# Patient Record
Sex: Female | Born: 1990 | Race: White | Hispanic: No | Marital: Married | Smoking: Never smoker
Health system: Southern US, Community
[De-identification: ages and names within clinical notes are randomized; demographics above are authoritative.]

## PROBLEM LIST (undated history)

## (undated) DIAGNOSIS — K5900 Constipation, unspecified: Secondary | ICD-10-CM

## (undated) DIAGNOSIS — F329 Major depressive disorder, single episode, unspecified: Secondary | ICD-10-CM

## (undated) DIAGNOSIS — R600 Localized edema: Secondary | ICD-10-CM

## (undated) DIAGNOSIS — F32A Depression, unspecified: Secondary | ICD-10-CM

## (undated) DIAGNOSIS — I10 Essential (primary) hypertension: Secondary | ICD-10-CM

## (undated) DIAGNOSIS — E785 Hyperlipidemia, unspecified: Secondary | ICD-10-CM

## (undated) DIAGNOSIS — R7303 Prediabetes: Secondary | ICD-10-CM

## (undated) DIAGNOSIS — F419 Anxiety disorder, unspecified: Secondary | ICD-10-CM

## (undated) DIAGNOSIS — K219 Gastro-esophageal reflux disease without esophagitis: Secondary | ICD-10-CM

## (undated) DIAGNOSIS — N946 Dysmenorrhea, unspecified: Secondary | ICD-10-CM

## (undated) DIAGNOSIS — O24419 Gestational diabetes mellitus in pregnancy, unspecified control: Secondary | ICD-10-CM

## (undated) HISTORY — DX: Gastro-esophageal reflux disease without esophagitis: K21.9

## (undated) HISTORY — DX: Essential (primary) hypertension: I10

## (undated) HISTORY — PX: NO PAST SURGERIES: SHX2092

## (undated) HISTORY — DX: Depression, unspecified: F32.A

## (undated) HISTORY — DX: Hyperlipidemia, unspecified: E78.5

## (undated) HISTORY — DX: Anxiety disorder, unspecified: F41.9

## (undated) HISTORY — DX: Constipation, unspecified: K59.00

## (undated) HISTORY — DX: Localized edema: R60.0

---
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
## 2017-03-31 DIAGNOSIS — N944 Primary dysmenorrhea: Secondary | ICD-10-CM

## 2017-03-31 HISTORY — DX: Primary dysmenorrhea: N94.4

## 2019-04-26 DIAGNOSIS — I10 Essential (primary) hypertension: Secondary | ICD-10-CM | POA: Insufficient documentation

## 2019-07-17 ENCOUNTER — Ambulatory Visit
Admission: EM | Admit: 2019-07-17 | Discharge: 2019-07-17 | Disposition: A | Discharge status: Home / Self Care | Payer: BC Managed Care – PPO | Attending: Emergency Medicine | Admitting: Emergency Medicine

## 2019-07-17 ENCOUNTER — Other Ambulatory Visit: Payer: Self-pay

## 2019-07-17 DIAGNOSIS — R51 Headache: Secondary | ICD-10-CM

## 2019-07-17 DIAGNOSIS — R112 Nausea with vomiting, unspecified: Secondary | ICD-10-CM | POA: Insufficient documentation

## 2019-07-17 DIAGNOSIS — Z20828 Contact with and (suspected) exposure to other viral communicable diseases: Secondary | ICD-10-CM

## 2019-07-17 DIAGNOSIS — R030 Elevated blood-pressure reading, without diagnosis of hypertension: Secondary | ICD-10-CM | POA: Diagnosis not present

## 2019-07-17 DIAGNOSIS — Z20822 Contact with and (suspected) exposure to covid-19: Secondary | ICD-10-CM

## 2019-07-17 DIAGNOSIS — Z3202 Encounter for pregnancy test, result negative: Secondary | ICD-10-CM | POA: Diagnosis not present

## 2019-07-17 DIAGNOSIS — R6889 Other general symptoms and signs: Secondary | ICD-10-CM | POA: Diagnosis not present

## 2019-07-17 LAB — POCT URINALYSIS DIP (MANUAL ENTRY)
Bilirubin, UA: NEGATIVE
Glucose, UA: NEGATIVE mg/dL
Ketones, POC UA: NEGATIVE mg/dL
Nitrite, UA: NEGATIVE
Protein Ur, POC: NEGATIVE mg/dL
Spec Grav, UA: 1.01 (ref 1.010–1.025)
Urobilinogen, UA: 0.2 E.U./dL
pH, UA: 6 (ref 5.0–8.0)

## 2019-07-17 LAB — POCT FASTING CBG KUC MANUAL ENTRY: POCT Glucose (KUC): 118 mg/dL — AB (ref 70–99)

## 2019-07-17 LAB — POCT URINE PREGNANCY: Preg Test, Ur: NEGATIVE

## 2019-07-17 NOTE — ED Provider Notes (Signed)
[facility]   [id number] 07/17/19 Arrival Time: 34  CC: ABDOMINAL DISCOMFORT  SUBJECTIVE:  BREEZY HERTENSTEIN is a 28 y.o. female who presents with headache, and vomiting with 4 episodes, that began 1 day ago.  Does admit to skipping a meal prior to symptoms.  Denies trauma, close contacts with similar symptoms, recent travel, antibiotic use, changes in diet or medications, or COVID/ flu/ strep exposure.  Denies abdominal pain.  Has not tried OTC medications.  Worse with eating, but tolerated tea and toast this morning.  Reports similar symptoms in the past when she has missed meals.   Denies fever, chills, rhinorrhea, congestion, cough, chest pain, SOB, diarrhea, constipation, hematochezia, melena, dysuria, difficulty urinating, increased frequency or urgency, flank pain, loss of bowel or bladder function, vaginal discharge, vaginal odor, vaginal bleeding, dyspareunia, pelvic pain.     No LMP recorded (lmp unknown). (Menstrual status: Irregular Periods).  ROS: As per HPI.  All other pertinent ROS negative.     History reviewed. No pertinent past medical history. History reviewed. No pertinent surgical history. No Known Allergies No current facility-administered medications on file prior to encounter.    Current Outpatient Medications on File Prior to Encounter  Medication Sig Dispense Refill  . escitalopram (LEXAPRO) 10 MG tablet Take 10 mg by mouth daily.     Social History   Socioeconomic History  . Marital status: Single    Spouse name: Not on file  . Number of children: Not on file  . Years of education: Not on file  . Highest education level: Not on file  Occupational History  . Not on file  Social Needs  . Financial resource strain: Not on file  . Food insecurity    Worry: Not on file    Inability: Not on file  . Transportation needs    Medical: Not on file    Non-medical: Not on file  Tobacco Use  . Smoking status: Not on file  Substance and Sexual  Activity  . Alcohol use: Not on file  . Drug use: Not on file  . Sexual activity: Not on file  Lifestyle  . Physical activity    Days per week: Not on file    Minutes per session: Not on file  . Stress: Not on file  Relationships  . Social Herbalist on phone: Not on file    Gets together: Not on file    Attends religious service: Not on file    Active member of club or organization: Not on file    Attends meetings of clubs or organizations: Not on file    Relationship status: Not on file  . Intimate partner violence    Fear of current or ex partner: Not on file    Emotionally abused: Not on file    Physically abused: Not on file    Forced sexual activity: Not on file  Other Topics Concern  . Not on file  Social History Narrative  . Not on file   History reviewed. No pertinent family history.   OBJECTIVE:  Vitals:   07/17/19 1036 07/17/19 1101  BP: (!) 164/124 (!) 154/99  Pulse: (!) 103   Resp: 18   Temp: 98.7 F (37.1 C)   SpO2: 96%     General appearance: Alert; appears mildly fatigued, but nontoxic HEENT: NCAT.  Oropharynx clear.  Lungs: clear to auscultation bilaterally without adventitious breath sounds Heart: regular rate and rhythm.  Radial pulses 2+ symmetrical bilaterally  Abdomen: soft, non-distended; normal active bowel sounds; non-tender to light and deep palpation; nontender at McBurney's point; no guarding Back: no CVA tenderness Extremities: no edema; symmetrical with no gross deformities Skin: warm and dry Neurologic: normal gait Psychological: alert and cooperative; normal mood and affect  LABS: Results for orders placed or performed during the hospital encounter of 07/17/19 (from the past 24 hour(s))  POCT urine pregnancy     Status: None   Collection Time: 07/17/19 10:50 AM  Result Value Ref Range   Preg Test, Ur Negative Negative  POCT urinalysis dipstick     Status: Abnormal   Collection Time: 07/17/19 10:50 AM  Result Value  Ref Range   Color, UA yellow yellow   Clarity, UA clear clear   Glucose, UA negative negative mg/dL   Bilirubin, UA negative negative   Ketones, POC UA negative negative mg/dL   Spec Grav, UA [phone] - 1.025   Blood, UA trace-intact (A) negative   pH, UA 6.0 5.0 - 8.0   Protein Ur, POC negative negative mg/dL   Urobilinogen, UA 0.2 0.2 or 1.0 E.U./dL   Nitrite, UA Negative Negative   Leukocytes, UA Small (1+) (A) Negative  POCT CBG (manual entry)     Status: Abnormal   Collection Time: 07/17/19 11:07 AM  Result Value Ref Range   POCT Glucose ([facility]) 118 (A) 70 - 99 mg/dL    ASSESSMENT & PLAN:  1. Suspected Covid-19 Virus Infection   2. Non-intractable vomiting with nausea, unspecified vomiting type   3. Elevated blood pressure reading    Declines zofran Blood sugar was 118. This is normal.   Urine pregnancy was negative Urine show trace blood and white blood cells.  We will send out to culture and notify you of abnormal results.    COVID testing ordered.  It will take 5-7 days for results.  Someone will contact you regarding abnormal results.   In the meantime: You should remain isolated in your home for 7 days from symptom onset AND greater than 72 hours after symptoms resolution (absence of fever without the use of fever-reducing medication and improvement in respiratory symptoms), whichever is longer Get plenty of rest and push fluids DIET Instructions:   Begin with sips of clear liquids. If able to hold down 2 - 4 ounces for 30 minutes, begin drinking more.  Increase your fluid intake to replace losses.  Clear liquids only for 24 hours (water, tea, sport drinks, clear flat ginger ale or cola and juices, broth, jello, popsicles, ect).  Advance to bland foods, applesauce, rice, baked or boiled chicken, ect.  Avoid milk, greasy foods and anything that doesn't agree with you. Use OTC medications like ibuprofen or tylenol as needed fever or pain Follow [facility] with [facility] week to establish care and have a physical Call or go to the ED if you have any new or worsening symptoms such as fever, worsening cough, shortness of breath, chest tightness, chest pain, turning blue, changes in mental status, nausea, vomiting, diarrhea, bloody or dark tarry stools, constipation, urinary symptoms, worsening abdominal discomfort, symptoms that do not improve with medications, inability to keep fluids down, etc...    Reviewed expectations re: course of current medical issues. Questions answered. Outlined signs and symptoms indicating need for more acute intervention. Patient verbalized understanding. After Visit Summary given.   Rennis HardingWurst, Kinsley Nicklaus, PA-C 07/17/19 1134

## 2019-07-17 NOTE — ED Triage Notes (Signed)
Pt states she has had vomiting since yesterday with headache, last vomited last night

## 2019-07-17 NOTE — Discharge Instructions (Signed)
Blood sugar was 118. This is normal.   Urine pregnancy was negative Urine show trace blood and white blood cells.  We will send out to culture and notify you of abnormal results.    COVID testing ordered.  It will take 5-7 days for results.  Someone will contact you regarding abnormal results.   In the meantime: You should remain isolated in your home for 7 days from symptom onset AND greater than 72 hours after symptoms resolution (absence of fever without the use of fever-reducing medication and improvement in respiratory symptoms), whichever is longer Get plenty of rest and push fluids DIET Instructions:   30 minutes after taking nausea medicine, begin with sips of clear liquids. If able to hold down 2 - 4 ounces for 30 minutes, begin drinking more.  Increase your fluid intake to replace losses.  Clear liquids only for 24 hours (water, tea, sport drinks, clear flat ginger ale or cola and juices, broth, jello, popsicles, ect).  Advance to bland foods, applesauce, rice, baked or boiled chicken, ect.  Avoid milk, greasy foods and anything that doesnt agree with you. Use OTC medications like ibuprofen or tylenol as needed fever or pain Follow up with Dr. Holly Bodily at [facility] next week to establish care and have a physical Call or go to the ED if you have any new or worsening symptoms such as fever, worsening cough, shortness of breath, chest tightness, chest pain, turning blue, changes in mental status, nausea, vomiting, diarrhea, bloody or dark tarry stools, constipation, urinary symptoms, worsening abdominal discomfort, symptoms that do not improve with medications, inability to keep fluids down, etc..Marland Kitchen

## 2019-07-18 LAB — URINE CULTURE

## 2019-07-19 LAB — NOVEL CORONAVIRUS, NAA: SARS-CoV-2, NAA: NOT DETECTED

## 2019-08-15 ENCOUNTER — Ambulatory Visit: Payer: BC Managed Care – PPO | Admitting: Family Medicine

## 2019-08-15 ENCOUNTER — Other Ambulatory Visit: Payer: Self-pay

## 2019-08-15 ENCOUNTER — Encounter: Payer: Self-pay | Admitting: Family Medicine

## 2019-08-15 VITALS — BP 122/84 | HR 63 | Temp 97.5°F | Resp 14 | Ht 59.0 in | Wt 187.1 lb

## 2019-08-15 DIAGNOSIS — R630 Anorexia: Secondary | ICD-10-CM

## 2019-08-15 DIAGNOSIS — I10 Essential (primary) hypertension: Secondary | ICD-10-CM | POA: Diagnosis not present

## 2019-08-15 DIAGNOSIS — F320 Major depressive disorder, single episode, mild: Secondary | ICD-10-CM

## 2019-08-15 DIAGNOSIS — Z6837 Body mass index (BMI) 37.0-37.9, adult: Secondary | ICD-10-CM

## 2019-08-15 DIAGNOSIS — N944 Primary dysmenorrhea: Secondary | ICD-10-CM

## 2019-08-15 DIAGNOSIS — R6889 Other general symptoms and signs: Secondary | ICD-10-CM | POA: Diagnosis not present

## 2019-08-15 DIAGNOSIS — E669 Obesity, unspecified: Secondary | ICD-10-CM

## 2019-08-15 DIAGNOSIS — R5383 Other fatigue: Secondary | ICD-10-CM | POA: Diagnosis not present

## 2019-08-15 DIAGNOSIS — Z1329 Encounter for screening for other suspected endocrine disorder: Secondary | ICD-10-CM

## 2019-08-15 DIAGNOSIS — Z130 Encounter for screening for diseases of the blood and blood-forming organs and certain disorders involving the immune mechanism: Secondary | ICD-10-CM

## 2019-08-15 DIAGNOSIS — Z13228 Encounter for screening for other metabolic disorders: Secondary | ICD-10-CM

## 2019-08-15 NOTE — Progress Notes (Signed)
Name: Kim Miles   MRN: [id number]    DOB: November 17, 1990   Date:08/21/2019       Progress Note  Chief Complaint  Patient presents with   Establish Care   Fatigue    loss of energy     Subjective:    Kim Miles is a 28 y.o. female,presents to clinic to establish care, today she complains of generalized fatigue and loss of appetite after GI illness about a month ago.    Had N/V about a month ago, went to [facility] in [place] to be seen, urine neg, blood sugar checked and wan normal, COVID test was normal, neg pregnancy test.  She states her symptoms were severe nausea and vomiting she can even keep water down.  She says that they "could not figure out what was wrong" and the told her how to gradually improve her diet she states that over the next week she was able to eat and drink without nausea vomiting.  At no time did she have any abdominal pain or diarrhea she did have some abdominal bloating.  The urgent care visit was on 07/17/2019 and for the past month since that visit she reports that her energy is not back to normal she has general lack of appetite and is feels extremely tired.  She is falling asleep at work at her desk, trouble staying away, if she sits down she can't stay away, so she tries to stay busy.  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  So far no near syncope, doesn't feel like she's going to pass out.  No CP, SOB, wheeze, palpitations, near syncope, numbness, tingling, headaches, visual disturbances, back pain, chest tightness.  Her strength and muscles don't feel like they are weak or gonna give out.   Just so tired.  If she sits down for more than 5 or 10 minutes she will fall asleep, but she has never had any like this before  She denies associated constipation, diarrhea, hair or skin changes, cold intolerance or heat intolerance, night sweats.   Denies any joint pain, swelling, fever, sweats, chills, lymphadenopathy, any rashes.  Patient is drinking and urinating like normal.  She does have a history of hot flashes to her face, with blushing, that randomly occur.  This is happened her whole life and there is no difference in this a do occur with some sweats.  Bedtime 11 pm and gets up at 6:30 am, almost falling asleep every day, she does try to get out of work for lunch.  She also notes elevated blood pressure during the urgent care visit and has been monitoring at home states that her blood pressure has been 140-150's/100-110, today is normal.  She does live alone, works alone  Family nearby in [place] -while ill she did go to stay with them she has a brother and a sister there, her mom and dad.  On Lexapro 10 mg for depression - mood and depression has gotten a lot better, PHQ was done today, mostly positive for fatigue and sleep symptoms and very mild symptoms for feeling bad, feeling down depressed hopeless, losing interest in things.  She is a little bit lonely and tired of Covid pandemic but she overall denies SI, HI, AVH and feels that her mood is much better on her medications.  She try to wean off  before but she became very depressed again and so she feels comfortable staying on Lexapro 10 mg dosing every day.   Office Visit from 08/15/2019 in [facility]  PHQ-9 Total Score  9     Appetite still down.  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 No pertinent  surgical history.  Family History  Problem Relation Age of Onset   Asthma Mother    Hypertension Father    Anemia Sister    Eating disorder Sister    Cancer Maternal Grandmother    Cancer Maternal Grandfather    Diabetes Maternal Grandfather    Alzheimer's disease Maternal Grandfather    Heart attack Paternal Grandmother    Arthritis Paternal Grandmother     Social History   Socioeconomic History   Marital status: Significant Other    Spouse name: Landi Biscardi   Number of children: 0   Years of education: 12   Highest education level: Master's degree (e.g., MA, MS, MEng, MEd, MSW, MBA)  Occupational History   Not on file  Social Needs   Financial resource strain: Not hard at all   Food insecurity    Worry: Never true    Inability: Never true   Transportation needs    Medical: No    Non-medical: No  Tobacco Use   Smoking status: Never Smoker   Smokeless tobacco: Never Used  Substance and Sexual Activity   Alcohol use: Never    Frequency: Never   Drug use: Never   Sexual activity: Never  Lifestyle   Physical activity    Days per week: 0 days    Minutes per session: 0 min   Stress: Only a little  Relationships   Social connections    Talks on phone: More than three times a week    Gets together: Once a week    Attends religious service: More than 4 times per year    Active member of club or organization: No    Attends meetings of clubs or organizations: Never    Relationship status: Never married   Intimate partner violence    Fear of current or ex partner: No    Emotionally abused: No    Physically abused: No    Forced sexual activity: No  Other Topics Concern   Not on file  Social History Narrative   Not on file     Current Outpatient Medications:    escitalopram (LEXAPRO) 10 MG tablet, Take 10 mg by mouth daily., Disp: , Rfl:    Norethin Ace-Eth Estrad-FE (BLISOVI FE 1/20 PO), Take by mouth., Disp: , Rfl:     lisinopril (ZESTRIL) 10 MG tablet, Take 1 tablet (10 mg total) by mouth daily., Disp: 90 tablet, Rfl: 3  No Known Allergies  I personally reviewed active problem list, medication list, allergies, family history, social history, health maintenance, notes from last encounter, lab results, imaging with the patient/caregiver today.  Review of Systems  Constitutional: Negative.   HENT: Negative.  Eyes: Negative.   Respiratory: Negative.   Cardiovascular: Negative.   Gastrointestinal: Negative.   Endocrine: Negative.   Genitourinary: Negative.   Musculoskeletal: Negative.   Skin: Negative.   Allergic/Immunologic: Negative.   Neurological: Negative.   Hematological: Negative.   Psychiatric/Behavioral: Negative.   All other systems reviewed and are negative.    Objective:   84 HR at time of exam Vitals:   08/15/19 1402  BP: 122/84  Pulse: 63  Resp: 14  Temp: (!) 97.5 F (36.4 C)  SpO2: 99%  Weight: 187 lb 1.6 oz (84.9 kg)  Height:  (1.499 m)    Body mass index is 37.79 kg/m.  Physical Exam Vitals signs and nursing note reviewed.  Constitutional:      General: She is not in acute distress.    Appearance: Normal appearance. She is well-developed. She is obese. She is not ill-appearing, toxic-appearing or diaphoretic.  HENT:     Head: Normocephalic and atraumatic.     Right Ear: External ear normal.     Left Ear: External ear normal.     Nose: Nose normal.     Mouth/Throat:     Mouth: Mucous membranes are moist.     Pharynx: Oropharynx is clear. Uvula midline.  Eyes:     General: Lids are normal. No scleral icterus.       Right eye: No discharge.        Left eye: No discharge.     Conjunctiva/sclera: Conjunctivae normal.     Pupils: Pupils are equal, round, and reactive to light.  Neck:     Musculoskeletal: Normal range of motion and neck supple.     Trachea: Phonation normal. No tracheal deviation.  Cardiovascular:     Rate and Rhythm: Normal rate and  regular rhythm.     Pulses: Normal pulses.          Radial pulses are 2+ on the right side and 2+ on the left side.       Posterior tibial pulses are 2+ on the right side and 2+ on the left side.     Heart sounds: Normal heart sounds. No murmur. No friction rub. No gallop.   Pulmonary:     Effort: Pulmonary effort is normal. No respiratory distress.     Breath sounds: Normal breath sounds. No stridor. No wheezing, rhonchi or rales.  Chest:     Chest wall: No tenderness.  Abdominal:     General: Bowel sounds are normal. There is no distension.     Palpations: Abdomen is soft.     Tenderness: There is no abdominal tenderness. There is no guarding or rebound.  Musculoskeletal: Normal range of motion.        General: No deformity.     Right lower leg: No edema.     Left lower leg: No edema.  Lymphadenopathy:     Cervical: No cervical adenopathy.  Skin:    General: Skin is warm and dry.     Capillary Refill: Capillary refill takes less than 2 seconds.     Coloration: Skin is not jaundiced or pale.     Findings: No rash.  Neurological:     Mental Status: She is alert.     Sensory: No sensory deficit.     Motor: No weakness or abnormal muscle tone.     Coordination: Coordination normal.     Gait: Gait normal.  Psychiatric:        Mood and Affect: Mood normal.  Speech: Speech normal.        Behavior: Behavior normal.        Thought Content: Thought content normal.        Judgment: Judgment normal.     Orthostatics done and negative  ECG interpretation   Date: 08/21/19  Rate: 86  Rhythm: normal sinus rhythm  QRS Axis: normal  Intervals: normal  ST/T Wave abnormalities: non-specific t-wave abnormalities in lead III, no ST elevation or depression  Conduction Disutrbances: none  Narrative Interpretation: "Sinus rhythm, Septal T wave changes borderlin abnormal - Borderline ECG"  Old EKG Reviewed: No past ECG to compare to    Recent Results (from the past 2160 hour(s))    Novel Coronavirus, NAA ([facility])     Status: None   Collection Time: 07/17/19 10:45 AM   Specimen: Nasopharyngeal Swab  Result Value Ref Range   SARS-CoV-2, NAA Not Detected Not Detected    Comment: This nucleic acid amplification test was developed and its performance characteristics determined by [facility]. Nucleic acid amplification tests include PCR and TMA. This test has not been FDA cleared or approved. This test has been authorized by FDA under an Emergency Use Authorization (EUA). This test is only authorized for the duration of time the declaration that circumstances exist justifying the authorization of the emergency use of in vitro diagnostic tests for detection of SARS-CoV-2 virus and/or diagnosis of COVID-19 infection under section 564(b)(1) of the Act, 21 U.S.C. 956OZH-0(Q360bbb-3(b) (1), unless the authorization is terminated or revoked sooner. When diagnostic testing is negative, the possibility of a false negative result should be considered in the context of a patient's recent exposures and the presence of clinical signs and symptoms consistent with COVID-19. An individual without symptoms of COVID-19 and who is not shedding SARS-CoV-2 virus would  expect to have a negative (not detected) result in this assay.   POCT urine pregnancy     Status: None   Collection Time: 07/17/19 10:50 AM  Result Value Ref Range   Preg Test, Ur Negative Negative  POCT urinalysis dipstick     Status: Abnormal   Collection Time: 07/17/19 10:50 AM  Result Value Ref Range   Color, UA yellow yellow   Clarity, UA clear clear   Glucose, UA negative negative mg/dL   Bilirubin, UA negative negative   Ketones, POC UA negative negative mg/dL   Spec Grav, UA [phone] - 1.025   Blood, UA trace-intact (A) negative   pH, UA 6.0 5.0 - 8.0   Protein Ur, POC negative negative mg/dL   Urobilinogen, UA 0.2 0.2 or 1.0 E.U./dL   Nitrite, UA Negative Negative   Leukocytes, UA Small (1+) (A)  Negative  POCT CBG (manual entry)     Status: Abnormal   Collection Time: 07/17/19 11:07 AM  Result Value Ref Range   POCT Glucose ([facility]) 118 (A) 70 - 99 mg/dL  Urine Culture     Status: Abnormal   Collection Time: 07/17/19 11:08 AM   Specimen: Urine, Clean Catch  Result Value Ref Range   Specimen Description      URINE, CLEAN CATCH Performed at [facility], [address] [place], [place] [address]    Special Requests      NONE Performed at [facility], [address] [place], [place] [address]    Culture MULTIPLE SPECIES PRESENT, SUGGEST RECOLLECTION (A)    Report Status 07/18/2019 FINAL   COMPLETE METABOLIC PANEL WITH GFR     Status: None   Collection  Time: 08/15/19 12:00 AM  Result Value Ref Range   Glucose, Bld 91 65 - 99 mg/dL    Comment: .            Fasting reference interval .    BUN 10 7 - 25 mg/dL   Creat 5.40 9.81 - 1.91 mg/dL   GFR, Est Non African American 120 > OR = 60 mL/min/1.60m2   GFR, Est African American 140 > OR = 60 mL/min/1.64m2   BUN/Creatinine Ratio NOT APPLICABLE 6 - 22 (calc)   Sodium 138 135 - 146 mmol/L   Potassium 4.1 3.5 - 5.3 mmol/L   Chloride 102 98 - 110 mmol/L   CO2 28 20 - 32 mmol/L   Calcium 8.9 8.6 - 10.2 mg/dL   Total Protein 6.8 6.1 - 8.1 g/dL   Albumin 3.7 3.6 - 5.1 g/dL   Globulin 3.1 1.9 - 3.7 g/dL (calc)   AG Ratio 1.2 1.0 - 2.5 (calc)   Total Bilirubin 0.3 0.2 - 1.2 mg/dL   Alkaline phosphatase (APISO) 78 31 - 125 U/L   AST 18 10 - 30 U/L   ALT 7 6 - 29 U/L  CBC with Differential/Platelet     Status: None   Collection Time: 08/15/19 12:00 AM  Result Value Ref Range   WBC 10.3 3.8 - 10.8 Thousand/uL   RBC 4.57 3.80 - 5.10 Million/uL   Hemoglobin 12.7 11.7 - 15.5 g/dL   HCT 47.8 29.5 - 62.1 %   MCV 86.4 80.0 - 100.0 fL   MCH 27.8 27.0 - 33.0 pg   MCHC 32.2 32.0 - 36.0 g/dL   RDW 30.8 65.7 - 84.6 %   Platelets 355 140 - 400 Thousand/uL   MPV 10.2 7.5 - 12.5 fL   Neutro Abs 6,561 1,500 - 7,800 cells/uL   Lymphs Abs  2,616 850 - 3,900 cells/uL   Absolute Monocytes 834 200 - 950 cells/uL   Eosinophils Absolute 196 15 - 500 cells/uL   Basophils Absolute 93 0 - 200 cells/uL   Neutrophils Relative % 63.7 %   Total Lymphocyte 25.4 %   Monocytes Relative 8.1 %   Eosinophils Relative 1.9 %   Basophils Relative 0.9 %  Hemoglobin A1c     Status: Abnormal   Collection Time: 08/15/19 12:00 AM  Result Value Ref Range   Hgb A1c MFr Bld 5.7 (H) <5.7 % of total Hgb    Comment: For someone without known diabetes, a hemoglobin  A1c value between 5.7% and 6.4% is consistent with prediabetes and should be confirmed with a  follow-up test. . For someone with known diabetes, a value <7% indicates that their diabetes is well controlled. A1c targets should be individualized based on duration of diabetes, age, comorbid conditions, and other considerations. . This assay result is consistent with an increased risk of diabetes. . Currently, no consensus exists regarding use of hemoglobin A1c for diagnosis of diabetes for children. .    Mean Plasma Glucose 117 (calc)   eAG (mmol/L) 6.5 (calc)  TSH     Status: None   Collection Time: 08/15/19 12:00 AM  Result Value Ref Range   TSH 0.70 mIU/L    Comment:           Reference Range .           > or = 20 Years  0.40-4.50 .                Pregnancy Ranges  First trimester    0.26-2.66           Second trimester   0.55-2.73           Third trimester    0.43-2.91   T4, free     Status: None   Collection Time: 08/15/19 12:00 AM  Result Value Ref Range   Free T4 0.9 0.8 - 1.8 ng/dL    ZOX0/9: Depression screen PHQ 2/9 08/21/2019  Decreased Interest 1  Down, Depressed, Hopeless 1  PHQ - 2 Score 2  Altered sleeping 2  Tired, decreased energy 3  Change in appetite 0  Feeling bad or failure about yourself  1  Trouble concentrating 0  Moving slowly or fidgety/restless 1  Suicidal thoughts 0  PHQ-9 Score 9  Difficult doing work/chores Very difficult     phq 9 is positive Addressed today, see HPI and A&P  Fall Risk: Fall Risk  08/15/2019  Falls in the past year? 0  Number falls in past yr: 0  Injury with Fall? 0    Functional Status Survey: Is the patient deaf or have difficulty hearing?: No Does the patient have difficulty seeing, even when wearing glasses/contacts?: No Does the patient have difficulty concentrating, remembering, or making decisions?: No Does the patient have difficulty walking or climbing stairs?: No Does the patient have difficulty dressing or bathing?: No Does the patient have difficulty doing errands alone such as visiting a doctor's office or shopping?: No    Assessment & Plan:    Pt here to establish care, has several recent acute complaints:    ICD-10-CM   1. Fatigue, unspecified type  R53.83 COMPLETE METABOLIC PANEL WITH GFR    CBC with Differential/Platelet    Hemoglobin A1c    TSH    T4, free    VITAMIN D 25 Hydroxy (Vit-D Deficiency, Fractures)    EKG 12-Lead   r/o causes - anemia, hypothyroid, screen for deficiencies ie vit D, B12, possibly COVID?, med SE?  2. Hypertension, unspecified type  I10 COMPLETE METABOLIC PANEL WITH GFR    EKG 12-Lead    lisinopril (ZESTRIL) 10 MG tablet   BP elevated today, similar to home readings, check labs - will start meds after reviewing labs, need to stop OCP's?, no end organ damage suspected EKG good  3. Decreased appetite  R63.0 COMPLETE METABOLIC PANEL WITH GFR    CBC with Differential/Platelet    Hemoglobin A1c    TSH    T4, free   following GI illness one month ago, no other associated GI sx, check labs, currently no indication for imaging, no abd ttp, check labs  4. Unintentional weight change  R68.89 COMPLETE METABOLIC PANEL WITH GFR    Hemoglobin A1c    TSH    T4, free   weight gain, r/o hypothyroid, may be secondary to depression?  med SE?  5. Other general symptoms and signs  R68.89 Hemoglobin A1c    TSH    T4, free    VITAMIN D 25  Hydroxy (Vit-D Deficiency, Fractures)    EKG 12-Lead   hot flashes, sweats, fatigue.  No recent tick bite, no rash, no arthralgias, fever, URI/ST sx, covid neg  6. Screening for endocrine, metabolic and immunity disorder  Z13.29 COMPLETE METABOLIC PANEL WITH GFR   Z13.228 CBC with Differential/Platelet   Z13.0 Hemoglobin A1c    TSH    T4, free    VITAMIN D 25 Hydroxy (Vit-D Deficiency, Fractures)  7. Screening for deficiency anemia  Z13.0 CBC with Differential/Platelet  8. Current mild episode of major depressive disorder, unspecified whether recurrent (HCC)  F32.0    mood good with lexapro, doesn't feel sad/depressed, well controlled  9. Class 2 obesity with body mass index (BMI) of 37.0 to 37.9 in adult, unspecified obesity type, unspecified whether serious comorbidity present  E66.9 COMPLETE METABOLIC PANEL WITH GFR   Z68.37 CBC with Differential/Platelet    Hemoglobin A1c    TSH    T4, free   Deferrred any education regarding BMI until later appt  10. Primary dysmenorrhea  N94.4 Norethin Ace-Eth Estrad-FE (BLISOVI FE 1/20 PO)   well controlled on OCP for the past year   Unclear etiology of all pt's sx, GI illness about a month ago that lasted 1 week with all sx resolving except decreased appetite - pt very fatigued since.  COVID was negative - no other focal or associated sx to suggest etiology - most suspicious would be thyroid dysfunction with fatigue and weight change.  Will check basic labs and thyroid.  F/up in 2 weeks to recheck BP.  Considered autoimmune, tickborn illness, and other causes of fatigue and her other sx, like palpitations, sweats, weight changes, following N/V, but lack of other sx made me feel that additional testing was unnecessary at this time.  Danelle Berry, PA-C 08/21/19 5:10 PM

## 2019-08-16 LAB — COMPLETE METABOLIC PANEL WITH GFR
AG Ratio: 1.2 (calc) (ref 1.0–2.5)
ALT: 7 U/L (ref 6–29)
AST: 18 U/L (ref 10–30)
Albumin: 3.7 g/dL (ref 3.6–5.1)
Alkaline phosphatase (APISO): 78 U/L (ref 31–125)
BUN: 10 mg/dL (ref 7–25)
CO2: 28 mmol/L (ref 20–32)
Calcium: 8.9 mg/dL (ref 8.6–10.2)
Chloride: 102 mmol/L (ref 98–110)
Creat: 0.63 mg/dL (ref 0.50–1.10)
GFR, Est African American: 140 mL/min/{1.73_m2} (ref >=60)
GFR, Est Non African American: 120 mL/min/{1.73_m2} (ref >=60)
Globulin: 3.1 g/dL (calc) (ref 1.9–3.7)
Glucose, Bld: 91 mg/dL (ref 65–99)
Potassium: 4.1 mmol/L (ref 3.5–5.3)
Sodium: 138 mmol/L (ref 135–146)
Total Bilirubin: 0.3 mg/dL (ref 0.2–1.2)
Total Protein: 6.8 g/dL (ref 6.1–8.1)

## 2019-08-16 LAB — CBC WITH DIFFERENTIAL/PLATELET
Absolute Monocytes: 834 cells/uL (ref 200–950)
Basophils Absolute: 93 cells/uL (ref 0–200)
Basophils Relative: 0.9 %
Eosinophils Absolute: 196 cells/uL (ref 15–500)
Eosinophils Relative: 1.9 %
HCT: 39.5 % (ref 35.0–45.0)
Hemoglobin: 12.7 g/dL (ref 11.7–15.5)
Lymphs Abs: 2616 cells/uL (ref 850–3900)
MCH: 27.8 pg (ref 27.0–33.0)
MCHC: 32.2 g/dL (ref 32.0–36.0)
MCV: 86.4 fL (ref 80.0–100.0)
MPV: 10.2 fL (ref 7.5–12.5)
Monocytes Relative: 8.1 %
Neutro Abs: 6561 cells/uL (ref 1500–7800)
Neutrophils Relative %: 63.7 %
Platelets: 355 10*3/uL (ref 140–400)
RBC: 4.57 10*6/uL (ref 3.80–5.10)
RDW: 13.9 % (ref 11.0–15.0)
Total Lymphocyte: 25.4 %
WBC: 10.3 10*3/uL (ref 3.8–10.8)

## 2019-08-16 LAB — TSH: TSH: 0.7 mIU/L

## 2019-08-16 LAB — HEMOGLOBIN A1C
Hgb A1c MFr Bld: 5.7 % of total Hgb — ABNORMAL HIGH (ref <5.7)
Mean Plasma Glucose: 117 (calc)
eAG (mmol/L): 6.5 (calc)

## 2019-08-16 LAB — T4, FREE: Free T4: 0.9 ng/dL (ref 0.8–1.8)

## 2019-08-16 MED ORDER — LISINOPRIL 10 MG PO TABS: 10.0000 mg | ORAL_TABLET | Freq: Every day | ORAL | 3 refills | Status: DC

## 2019-08-21 ENCOUNTER — Encounter: Payer: Self-pay | Admitting: Family Medicine

## 2019-08-29 ENCOUNTER — Encounter: Payer: Self-pay | Admitting: Family Medicine

## 2019-08-29 ENCOUNTER — Ambulatory Visit: Payer: BC Managed Care – PPO | Admitting: Family Medicine

## 2019-08-29 ENCOUNTER — Other Ambulatory Visit: Payer: Self-pay

## 2019-08-29 VITALS — BP 120/82 | HR 89 | Temp 97.5°F | Resp 14 | Ht 59.0 in | Wt 185.9 lb

## 2019-08-29 DIAGNOSIS — N944 Primary dysmenorrhea: Secondary | ICD-10-CM

## 2019-08-29 DIAGNOSIS — I10 Essential (primary) hypertension: Secondary | ICD-10-CM | POA: Diagnosis not present

## 2019-08-29 DIAGNOSIS — F320 Major depressive disorder, single episode, mild: Secondary | ICD-10-CM

## 2019-08-29 DIAGNOSIS — R5383 Other fatigue: Secondary | ICD-10-CM | POA: Diagnosis not present

## 2019-08-29 DIAGNOSIS — R6889 Other general symptoms and signs: Secondary | ICD-10-CM | POA: Diagnosis not present

## 2019-08-29 DIAGNOSIS — E6609 Other obesity due to excess calories: Secondary | ICD-10-CM

## 2019-08-29 DIAGNOSIS — R7303 Prediabetes: Secondary | ICD-10-CM

## 2019-08-29 DIAGNOSIS — Z6837 Body mass index (BMI) 37.0-37.9, adult: Secondary | ICD-10-CM

## 2019-08-29 MED ORDER — ESCITALOPRAM OXALATE 10 MG PO TABS: 10.0000 mg | ORAL_TABLET | Freq: Every day | ORAL | 3 refills | Status: DC

## 2019-08-29 NOTE — Progress Notes (Signed)
Patient ID: Kim Miles, female    DOB: August 25, 1991, 28 y.o.   MRN: [id number]  PCP: Danelle Berry, PA-C  Chief Complaint  Patient presents with  . Follow-up    Subjective:   Kim Miles is a 28 y.o. female, presents to clinic with CC of the following:   Pt here for blood pressure recheck. Lab OV BP was elevated 140-150's with diastolic 90-100  BP Readings from Last 5 Encounters:  08/29/19 120/82  08/15/19 122/84  07/17/19 (!) 154/99    Pt agreed to start lisinopril Pt reports home BP readings have been in the 120-130's / 80's range Taking meds as directed w/o problems, reports he is compliant with med changes. Pt denies chest pain, SOB, dizziness, or heart palpitations.  Denies medication side effects.   Pt overall feels much better, last visit she was dealing with weight gain, severe fatigue, vague abd discomfort and decreased appetite.  She reports her eating/nutrition is better, energy better, she is having less headaches No longer falling asleep at work  ESS score 11 She is falling asleep less, feels much better with changing her foods, eliminating fast food She does not want a sleep study right now, she feels like after being ill, that she felt so bad because of her current lifestyle.     Patient Active Problem List   Diagnosis Date Noted  . High blood pressure 04/26/2019  . Primary dysmenorrhea 03/31/2017      Current Outpatient Medications:  .  escitalopram (LEXAPRO) 10 MG tablet, Take 10 mg by mouth daily., Disp: , Rfl:  .  lisinopril (ZESTRIL) 10 MG tablet, Take 1 tablet (10 mg total) by mouth daily., Disp: 90 tablet, Rfl: 3 .  Norethin Ace-Eth Estrad-FE (BLISOVI FE 1/20 PO), Take by mouth., Disp: , Rfl:    No Known Allergies   Family History  Problem Relation Age of Onset  . Asthma Mother   . Hypertension Father   . Anemia Sister   . Eating disorder Sister   . Cancer Maternal Grandmother   . Cancer Maternal Grandfather   . Diabetes  Maternal Grandfather   . Alzheimer's disease Maternal Grandfather   . Heart attack Paternal Grandmother   . Arthritis Paternal Grandmother      Social History   Socioeconomic History  . Marital status: Significant Other    Spouse name: Ninel Abdella  . Number of children: 0  . Years of education: 37  . Highest education level: Master's degree (e.g., MA, MS, MEng, MEd, MSW, MBA)  Occupational History  . Not on file  Social Needs  . Financial resource strain: Not hard at all  . Food insecurity    Worry: Never true    Inability: Never true  . Transportation needs    Medical: No    Non-medical: No  Tobacco Use  . Smoking status: Never Smoker  . Smokeless tobacco: Never Used  Substance and Sexual Activity  . Alcohol use: Never    Frequency: Never  . Drug use: Never  . Sexual activity: Never  Lifestyle  . Physical activity    Days per week: 0 days    Minutes per session: 0 min  . Stress: Only a little  Relationships  . Social connections    Talks on phone: More than three times a week    Gets together: Once a week    Attends religious service: More than 4 times per year    Active member of club or  organization: No    Attends meetings of clubs or organizations: Never    Relationship status: Never married  . Intimate partner violence    Fear of current or ex partner: No    Emotionally abused: No    Physically abused: No    Forced sexual activity: No  Other Topics Concern  . Not on file  Social History Narrative  . Not on file    I personally reviewed active problem list, medication list, allergies, notes from last encounter, lab results with the patient/caregiver today.  Review of Systems  Constitutional: Negative.   HENT: Negative.   Eyes: Negative.   Respiratory: Negative.   Cardiovascular: Negative.   Gastrointestinal: Negative.   Endocrine: Negative.   Genitourinary: Negative.   Musculoskeletal: Negative.   Skin: Negative.   Allergic/Immunologic:  Negative.   Neurological: Negative.   Hematological: Negative.   Psychiatric/Behavioral: Negative.   All other systems reviewed and are negative.      Objective:   Vitals:   08/29/19 1404  BP: 120/82  Pulse: 89  Resp: 14  Temp: (!) 97.5 F (36.4 C)  SpO2: 99%  Weight: 185 lb 14.4 oz (84.3 kg)  Height: 4\' 11"  (1.499 m)    Body mass index is 37.55 kg/m.  Physical Exam Vitals signs and nursing note reviewed.  Constitutional:      General: She is not in acute distress.    Appearance: Normal appearance. She is well-developed. She is not ill-appearing, toxic-appearing or diaphoretic.     Interventions: Face mask in place.  HENT:     Head: Normocephalic and atraumatic.     Right Ear: External ear normal.     Left Ear: External ear normal.  Eyes:     General: Lids are normal. No scleral icterus.       Right eye: No discharge.        Left eye: No discharge.     Conjunctiva/sclera: Conjunctivae normal.  Neck:     Musculoskeletal: Normal range of motion and neck supple.     Trachea: Phonation normal. No tracheal deviation.  Cardiovascular:     Rate and Rhythm: Normal rate and regular rhythm.     Pulses: Normal pulses.          Radial pulses are 2+ on the right side and 2+ on the left side.       Posterior tibial pulses are 2+ on the right side and 2+ on the left side.     Heart sounds: Normal heart sounds. No murmur. No friction rub. No gallop.   Pulmonary:     Effort: Pulmonary effort is normal. No respiratory distress.     Breath sounds: Normal breath sounds. No stridor. No wheezing, rhonchi or rales.  Chest:     Chest wall: No tenderness.  Abdominal:     General: Bowel sounds are normal. There is no distension.     Palpations: Abdomen is soft.     Tenderness: There is no abdominal tenderness. There is no guarding or rebound.  Musculoskeletal: Normal range of motion.        General: No deformity.     Right lower leg: No edema.     Left lower leg: No edema.   Lymphadenopathy:     Cervical: No cervical adenopathy.  Skin:    General: Skin is warm and dry.     Capillary Refill: Capillary refill takes less than 2 seconds.     Coloration: Skin is not jaundiced or pale.  Findings: No rash.  Neurological:     Mental Status: She is alert and oriented to person, place, and time.     Motor: No abnormal muscle tone.     Gait: Gait normal.  Psychiatric:        Mood and Affect: Mood normal.        Speech: Speech normal.        Behavior: Behavior normal.      Results for orders placed or performed in visit on 08/15/19  COMPLETE METABOLIC PANEL WITH GFR  Result Value Ref Range   Glucose, Bld 91 65 - 99 mg/dL   BUN 10 7 - 25 mg/dL   Creat 7.820.63 9.560.50 - 2.131.10 mg/dL   GFR, Est Non African American 120 > OR = 60 mL/min/1.4673m2   GFR, Est African American 140 > OR = 60 mL/min/1.4473m2   BUN/Creatinine Ratio NOT APPLICABLE 6 - 22 (calc)   Sodium 138 135 - 146 mmol/L   Potassium 4.1 3.5 - 5.3 mmol/L   Chloride 102 98 - 110 mmol/L   CO2 28 20 - 32 mmol/L   Calcium 8.9 8.6 - 10.2 mg/dL   Total Protein 6.8 6.1 - 8.1 g/dL   Albumin 3.7 3.6 - 5.1 g/dL   Globulin 3.1 1.9 - 3.7 g/dL (calc)   AG Ratio 1.2 1.0 - 2.5 (calc)   Total Bilirubin 0.3 0.2 - 1.2 mg/dL   Alkaline phosphatase (APISO) 78 31 - 125 U/L   AST 18 10 - 30 U/L   ALT 7 6 - 29 U/L  CBC with Differential/Platelet  Result Value Ref Range   WBC 10.3 3.8 - 10.8 Thousand/uL   RBC 4.57 3.80 - 5.10 Million/uL   Hemoglobin 12.7 11.7 - 15.5 g/dL   HCT 08.639.5 57.835.0 - 46.945.0 %   MCV 86.4 80.0 - 100.0 fL   MCH 27.8 27.0 - 33.0 pg   MCHC 32.2 32.0 - 36.0 g/dL   RDW 62.913.9 52.811.0 - 41.315.0 %   Platelets 355 140 - 400 Thousand/uL   MPV 10.2 7.5 - 12.5 fL   Neutro Abs 6,561 1,500 - 7,800 cells/uL   Lymphs Abs 2,616 850 - 3,900 cells/uL   Absolute Monocytes 834 200 - 950 cells/uL   Eosinophils Absolute 196 15 - 500 cells/uL   Basophils Absolute 93 0 - 200 cells/uL   Neutrophils Relative % 63.7 %   Total  Lymphocyte 25.4 %   Monocytes Relative 8.1 %   Eosinophils Relative 1.9 %   Basophils Relative 0.9 %  Hemoglobin A1c  Result Value Ref Range   Hgb A1c MFr Bld 5.7 (H) <5.7 % of total Hgb   Mean Plasma Glucose 117 (calc)   eAG (mmol/L) 6.5 (calc)  TSH  Result Value Ref Range   TSH 0.70 mIU/L  T4, free  Result Value Ref Range   Free T4 0.9 0.8 - 1.8 ng/dL        Assessment & Plan:      ICD-10-CM   1. Hypertension, unspecified type  I10    well controlled  2. Fatigue, unspecified type  R53.83    improving with lifestyle changes  3. Unintentional weight change  R68.89    working on lifestyle changes, no hypothyroid  4. Primary dysmenorrhea  N94.4    wants to continue OCPs managing well, BP now well controlled  5. Current mild episode of major depressive disorder, unspecified whether recurrent (HCC)  F32.0    feels good with Lexapro  6. Prediabetes  R73.03    2 weeks ago A1C 5.7, pt working on diet and exercise, will recheck in 4-6 months  7. Class 2 obesity due to excess calories without serious comorbidity with body mass index (BMI) of 37.0 to 37.9 in adult  E66.09    Z68.37    A1C elevated, diet and exercise counseling done and last and this visit, encouraged healthy diet, exercise, small attainable goals, for now 5-10 lbs in the next 6 months, f/up OV to check her progress.          Danelle Berry, PA-C 08/29/19 2:22 PM

## 2019-12-27 ENCOUNTER — Ambulatory Visit: Payer: BC Managed Care – PPO | Admitting: Family Medicine

## 2020-01-01 ENCOUNTER — Encounter: Payer: Self-pay | Admitting: Family Medicine

## 2020-01-01 ENCOUNTER — Other Ambulatory Visit: Payer: Self-pay

## 2020-01-01 ENCOUNTER — Ambulatory Visit: Payer: BC Managed Care – PPO | Admitting: Family Medicine

## 2020-01-01 VITALS — BP 130/86 | HR 89 | Temp 98.5°F | Resp 14 | Ht 59.0 in | Wt 182.1 lb

## 2020-01-01 DIAGNOSIS — I10 Essential (primary) hypertension: Secondary | ICD-10-CM

## 2020-01-01 DIAGNOSIS — Z6836 Body mass index (BMI) 36.0-36.9, adult: Secondary | ICD-10-CM

## 2020-01-01 DIAGNOSIS — F321 Major depressive disorder, single episode, moderate: Secondary | ICD-10-CM | POA: Insufficient documentation

## 2020-01-01 DIAGNOSIS — R7303 Prediabetes: Secondary | ICD-10-CM | POA: Diagnosis not present

## 2020-01-01 DIAGNOSIS — E669 Obesity, unspecified: Secondary | ICD-10-CM | POA: Diagnosis not present

## 2020-01-01 DIAGNOSIS — F320 Major depressive disorder, single episode, mild: Secondary | ICD-10-CM | POA: Diagnosis not present

## 2020-01-01 DIAGNOSIS — Z0000 Encounter for general adult medical examination without abnormal findings: Secondary | ICD-10-CM

## 2020-01-01 DIAGNOSIS — G43829 Menstrual migraine, not intractable, without status migrainosus: Secondary | ICD-10-CM

## 2020-01-01 DIAGNOSIS — E66812 Obesity, class 2: Secondary | ICD-10-CM | POA: Insufficient documentation

## 2020-01-01 HISTORY — DX: Menstrual migraine, not intractable, without status migrainosus: G43.829

## 2020-01-01 HISTORY — DX: Obesity, unspecified: E66.9

## 2020-01-01 MED ORDER — NORETHIN ACE-ETH ESTRAD-FE 1-20 MG-MCG PO TABS: 1.0000 | ORAL_TABLET | Freq: Every day | ORAL | 11 refills | Status: DC

## 2020-01-01 NOTE — Progress Notes (Signed)
Name: Kim Miles   MRN: [id number]    DOB: January 16, 1991   Date:01/01/2020       Progress Note  Chief Complaint  Patient presents with  . Follow-up  . Hypertension  . Depression  . Weight Check     Subjective:   Kim Miles is a 29 y.o. female, presents to clinic for routine follow up on the conditions listed above.  Hypertension:  Pt diagnosed with HTN in the last 6 months  Currently managed on lisinopril 10 mg Pt good med compliance and denies any SE.  Blood pressure today is not at goal, discussed goal <130/80 and optimally to be <120/70 on average BP Readings from Last 3 Encounters:  01/01/20 130/86  08/29/19 120/82  08/15/19 122/84  Pt denies CP, SOB, exertional sx, LE edema, palpitation, Ha's, visual disturbances, lightheadedness, hypotension, syncope. Dietary efforts for BP?  Trying to be healthy, working on diet and exercising   On Birth control junel fe- updated med list today - she is skipping placebo and has not had withdrawal bleed in more than 6 months, this has helped her control migraines related to cycles.    Mood good - energy better, she feels so much better, she is not exhausted all the time, no more falling asleep at work, less depressed, she feels better with spring and getting away from winter, she also got engaged and is planning wedding in Oct 2021.  She continues to take  lexapro 10 mg daily and she feels much better.  PHQ reviewed today Depression screen [facility] 2/9 01/01/2020 08/29/2019 08/21/2019  Decreased Interest 0 0 1  Down, Depressed, Hopeless 0 0 1  PHQ - 2 Score 0 0 2  Altered sleeping 0 0 2  Tired, decreased energy 0 0 3  Change in appetite 0 0 0  Feeling bad or failure about yourself  0 0 1  Trouble concentrating 0 0 0  Moving slowly or fidgety/restless 0 0 1  Suicidal thoughts 0 0 0  PHQ-9 Score 0 0 9  Difficult doing work/chores Not difficult at all Not difficult at all Very difficult   Prediabetes - last A1C 5.7  Family has been  "strictly helpful" - she has been working on her diet,  No weight changes yet, has only been able to go to the gym on weekends, but is working on diet changes.  She has started substituting smoothies in place of sweets and deserts like ice cream.  Going to [employer] a lot. Making more food at home, avoiding eating out for lunch Making more wraps adding black beans, brown rice, spinach    Obesity - BMI decreased over the past 5-6 months, down 5 lbs overall Wt Readings from Last 5 Encounters:  01/01/20 182 lb 1.6 oz (82.6 kg)  08/29/19 185 lb 14.4 oz (84.3 kg)  08/15/19 187 lb 1.6 oz (84.9 kg)   BMI Readings from Last 5 Encounters:  01/01/20 36.78 kg/m  08/29/19 37.55 kg/m  08/15/19 37.79 kg/m           Patient Active Problem List   Diagnosis Date Noted  . Prediabetes 01/01/2020  . Current mild episode of major depressive disorder (HCC) 01/01/2020  . Menstrual migraine without status migrainosus, not intractable 01/01/2020  . Class 2 obesity with body mass index (BMI) of 36.0 to 36.9 in adult 01/01/2020  . High blood pressure 04/26/2019  . Primary dysmenorrhea 03/31/2017    History reviewed. No pertinent surgical history.  Family History  Problem Relation Age of Onset  . Asthma Mother   . Hypertension Father   . Anemia Sister   . Eating disorder Sister   . Cancer Maternal Grandmother   . Cancer Maternal Grandfather   . Diabetes Maternal Grandfather   . Alzheimer's disease Maternal Grandfather   . Heart attack Paternal Grandmother   . Arthritis Paternal Grandmother     Social History   Tobacco Use  . Smoking status: Never Smoker  . Smokeless tobacco: Never Used  Substance Use Topics  . Alcohol use: Never  . Drug use: Never      Current Outpatient Medications:  .  escitalopram (LEXAPRO) 10 MG tablet, Take 1 tablet (10 mg total) by mouth at bedtime., Disp: 90 tablet, Rfl: 3 .  lisinopril (ZESTRIL) 10 MG tablet, Take 1 tablet (10 mg total) by mouth  daily., Disp: 90 tablet, Rfl: 3 .  norethindrone-ethinyl estradiol (LOESTRIN FE) 1-20 MG-MCG tablet, Take 1 tablet by mouth daily., Disp: 1 Package, Rfl: 11  No Known Allergies  Chart Review Today: I personally reviewed active problem list, medication list, allergies, family history, social history, health maintenance, notes from last encounter, lab results, imaging with the patient/caregiver today.  Review of Systems  10 Systems reviewed and are negative for acute change except as noted in the HPI.    Objective:    Vitals:   01/01/20 1519  BP: 130/86  Pulse: 89  Resp: 14  Temp: 98.5 F (36.9 C)  SpO2: 100%  Weight: 182 lb 1.6 oz (82.6 kg)  Height: 4\' 11"  (1.499 m)    Body mass index is 36.78 kg/m.  Physical Exam Vitals and nursing note reviewed.  Constitutional:      General: She is not in acute distress.    Appearance: Normal appearance. She is well-developed. She is obese. She is not ill-appearing, toxic-appearing or diaphoretic.     Interventions: Face mask in place.  HENT:     Head: Normocephalic and atraumatic.     Right Ear: External ear normal.     Left Ear: External ear normal.  Eyes:     General: Lids are normal. No scleral icterus.       Right eye: No discharge.        Left eye: No discharge.     Conjunctiva/sclera: Conjunctivae normal.  Neck:     Thyroid: Thyromegaly (fullness to anterior neck) present. No thyroid mass or thyroid tenderness.     Trachea: Phonation normal. No tracheal deviation.  Cardiovascular:     Rate and Rhythm: Normal rate and regular rhythm.     Pulses: Normal pulses.          Radial pulses are 2+ on the right side and 2+ on the left side.       Posterior tibial pulses are 2+ on the right side and 2+ on the left side.     Heart sounds: Normal heart sounds. No murmur. No friction rub. No gallop.   Pulmonary:     Effort: Pulmonary effort is normal. No respiratory distress.     Breath sounds: Normal breath sounds. No stridor. No  wheezing, rhonchi or rales.  Chest:     Chest wall: No tenderness.  Abdominal:     General: Bowel sounds are normal. There is no distension.     Palpations: Abdomen is soft.     Tenderness: There is no abdominal tenderness. There is no guarding or rebound.  Musculoskeletal:        General: No  deformity. Normal range of motion.     Cervical back: Normal range of motion and neck supple.     Right lower leg: No edema.     Left lower leg: No edema.  Lymphadenopathy:     Cervical: No cervical adenopathy.  Skin:    General: Skin is warm and dry.     Capillary Refill: Capillary refill takes less than 2 seconds.     Coloration: Skin is not jaundiced or pale.     Findings: No rash.  Neurological:     Mental Status: She is alert and oriented to person, place, and time.     Motor: No abnormal muscle tone.     Gait: Gait normal.  Psychiatric:        Attention and Perception: Attention normal.        Mood and Affect: Mood and affect normal.        Speech: Speech normal.        Behavior: Behavior normal. Behavior is cooperative.        Thought Content: Thought content normal.       Fall Risk: Fall Risk  01/01/2020 08/29/2019 08/15/2019  Falls in the past year? 0 0 0  Number falls in past yr: 0 0 0  Injury with Fall? 0 0 0    Functional Status Survey: Is the patient deaf or have difficulty hearing?: No Does the patient have difficulty seeing, even when wearing glasses/contacts?: No Does the patient have difficulty concentrating, remembering, or making decisions?: No Does the patient have difficulty walking or climbing stairs?: No Does the patient have difficulty dressing or bathing?: No Does the patient have difficulty doing errands alone such as visiting a doctor's office or shopping?: No   Assessment & Plan:     ICD-10-CM   1. Hypertension, unspecified type  I10    not quite at goal today, continue to monitor with lifestyle efforts, discussed med changes if >130/80  2. Current  mild episode of major depressive disorder, unspecified whether recurrent ([place])  F32.0    improved depressive sx, no concerns of SE with lexapro, wishes to continue meds, feels better with warmer weather  3. Prediabetes  R73.03 Hemoglobin A1c   encouraged continued diet and exercising to improve, pt wishes to wait until summer to recheck labs  4. Class 2 obesity with body mass index (BMI) of 36.0 to 36.9 in adult, unspecified obesity type, unspecified whether serious comorbidity present  E66.9    Z68.36    some weight loss with diet and lifestyle efforts, encouraged her to continue working on adding lean meat, veggies, decrease carbs/calorie increase exercise  5. Menstrual migraine without status migrainosus, not intractable  G43.829    well controlled with junel-fe - pt skipping placebo and withdrawal bleed - reviewed OCP, monitoring, SE/risk.  6. Adult general medical exam  O53.66 COMPLETE METABOLIC PANEL WITH GFR    Hemoglobin A1c    Lipid panel    CBC with Differential/Platelet   labs ordered for future CPE   Fatigue and excessive sleepiness much improved, pt does not want any sleep study done - she has noticed a big improvement with diet changes, decreased fast food, and starting to exercise  Pts reports a change in OCP - updated on chart - not clear where she is getting rx from, last saw OBGYN over 2 years ago, Dr. Leafy Ro - pt hasn't seen since 03/2017 - she is changing how she takes OCPs on her own to avoid withdrawal bleed  and associated mentrual HA's/migraines Attempted to review literature with pt today on how long is safe to suppress mentration - did not find answer - did discuss breaks for menses to help manage break through bleeding.   Will need to further research/consult colleagues and notify pt accordingly  Return for 5 month f/up for CPE and HTN/MDD/prediabetes - labs one week before.   Danelle Berry, PA-C 01/01/20 7:17 PM

## 2020-01-01 NOTE — Patient Instructions (Signed)
Managing Your Hypertension Hypertension is commonly called high blood pressure. This is when the force of your blood pressing against the walls of your arteries is too strong. Arteries are blood vessels that carry blood from your heart throughout your body. Hypertension forces the heart to work harder to pump blood, and may cause the arteries to become narrow or stiff. Having untreated or uncontrolled hypertension can cause heart attack, stroke, kidney disease, and other problems. What are blood pressure readings? A blood pressure reading consists of a higher number over a lower number. Ideally, your blood pressure should be below 120/80. The first ("top") number is called the systolic pressure. It is a measure of the pressure in your arteries as your heart beats. The second ("bottom") number is called the diastolic pressure. It is a measure of the pressure in your arteries as the heart relaxes. What does my blood pressure reading mean? Blood pressure is classified into four stages. Based on your blood pressure reading, your health care provider may use the following stages to determine what type of treatment you need, if any. Systolic pressure and diastolic pressure are measured in a unit called mm Hg. Normal  Systolic pressure: below 120.  Diastolic pressure: below 80. Elevated  Systolic pressure: 120-129.  Diastolic pressure: below 80. Hypertension stage 1  Systolic pressure: 130-139.  Diastolic pressure: 80-89. Hypertension stage 2  Systolic pressure: 140 or above.  Diastolic pressure: 90 or above. What health risks are associated with hypertension? Managing your hypertension is an important responsibility. Uncontrolled hypertension can lead to:  A heart attack.  A stroke.  A weakened blood vessel (aneurysm).  Heart failure.  Kidney damage.  Eye damage.  Metabolic syndrome.  Memory and concentration problems. What changes can I make to manage my  hypertension? Hypertension can be managed by making lifestyle changes and possibly by taking medicines. Your health care provider will help you make a plan to bring your blood pressure within a normal range. Eating and drinking   Eat a diet that is high in fiber and potassium, and low in salt (sodium), added sugar, and fat. An example eating plan is called the DASH (Dietary Approaches to Stop Hypertension) diet. To eat this way: ? Eat plenty of fresh fruits and vegetables. Try to fill half of your plate at each meal with fruits and vegetables. ? Eat whole grains, such as whole wheat pasta, brown rice, or whole grain bread. Fill about one quarter of your plate with whole grains. ? Eat low-fat diary products. ? Avoid fatty cuts of meat, processed or cured meats, and poultry with skin. Fill about one quarter of your plate with lean proteins such as fish, chicken without skin, beans, eggs, and tofu. ? Avoid premade and processed foods. These tend to be higher in sodium, added sugar, and fat.  Reduce your daily sodium intake. Most people with hypertension should eat less than 1,500 mg of sodium a day.  Limit alcohol intake to no more than 1 drink a day for nonpregnant women and 2 drinks a day for men. One drink equals 12 oz of beer, 5 oz of wine, or 1 oz of hard liquor. Lifestyle  Work with your health care provider to maintain a healthy body weight, or to lose weight. Ask what an ideal weight is for you.  Get at least 30 minutes of exercise that causes your heart to beat faster (aerobic exercise) most days of the week. Activities may include walking, swimming, or biking.  Include exercise   to strengthen your muscles (resistance exercise), such as weight lifting, as part of your weekly exercise routine. Try to do these types of exercises for 30 minutes at least 3 days a week.  Do not use any products that contain nicotine or tobacco, such as cigarettes and e-cigarettes. If you need help quitting,  ask your health care provider.  Control any long-term (chronic) conditions you have, such as high cholesterol or diabetes. Monitoring  Monitor your blood pressure at home as told by your health care provider. Your personal target blood pressure may vary depending on your medical conditions, your age, and other factors.  Have your blood pressure checked regularly, as often as told by your health care provider. Working with your health care provider  Review all the medicines you take with your health care provider because there may be side effects or interactions.  Talk with your health care provider about your diet, exercise habits, and other lifestyle factors that may be contributing to hypertension.  Visit your health care provider regularly. Your health care provider can help you create and adjust your plan for managing hypertension. Will I need medicine to control my blood pressure? Your health care provider may prescribe medicine if lifestyle changes are not enough to get your blood pressure under control, and if:  Your systolic blood pressure is 130 or higher.  Your diastolic blood pressure is 80 or higher. Take medicines only as told by your health care provider. Follow the directions carefully. Blood pressure medicines must be taken as prescribed. The medicine does not work as well when you skip doses. Skipping doses also puts you at risk for problems. Contact a health care provider if:  You think you are having a reaction to medicines you have taken.  You have repeated (recurrent) headaches.  You feel dizzy.  You have swelling in your ankles.  You have trouble with your vision. Get help right away if:  You develop a severe headache or confusion.  You have unusual weakness or numbness, or you feel faint.  You have severe pain in your chest or abdomen.  You vomit repeatedly.  You have trouble breathing. Summary  Hypertension is when the force of blood pumping  through your arteries is too strong. If this condition is not controlled, it may put you at risk for serious complications.  Your personal target blood pressure may vary depending on your medical conditions, your age, and other factors. For most people, a normal blood pressure is less than 120/80.  Hypertension is managed by lifestyle changes, medicines, or both. Lifestyle changes include weight loss, eating a healthy, low-sodium diet, exercising more, and limiting alcohol. This information is not intended to replace advice given to you by your health care provider. Make sure you discuss any questions you have with your health care provider. Document Revised: 02/03/2019 Document Reviewed: 09/09/2016 Elsevier Patient Education  2020 [employer].    Prediabetes Prediabetes is the condition of having a blood sugar (blood glucose) level that is higher than it should be, but not high enough for you to be diagnosed with type 2 diabetes. Having prediabetes puts you at risk for developing type 2 diabetes (type 2 diabetes mellitus). Prediabetes may be called impaired glucose tolerance or impaired fasting glucose. Prediabetes usually does not cause symptoms. Your health care provider can diagnose this condition with blood tests. You may be tested for prediabetes if you are overweight and if you have at least one other risk factor for prediabetes. What is  blood glucose, and how is it measured? Blood glucose refers to the amount of glucose in your bloodstream. Glucose comes from eating foods that contain sugars and starches (carbohydrates), which the body breaks down into glucose. Your blood glucose level may be measured in mg/dL (milligrams per deciliter) or mmol/L (millimoles per liter). Your blood glucose may be checked with one or more of the following blood tests:  A fasting blood glucose (FBG) test. You will not be allowed to eat (you will fast) for 8 hours or longer before a blood sample is taken. ? A  normal range for FBG is 70-100 mg/dl (1.7-6.1 mmol/L).  An A1c (hemoglobin A1c) blood test. This test provides information about blood glucose control over the previous 2?92months.  An oral glucose tolerance test (OGTT). This test measures your blood glucose at two times: ? After fasting. This is your baseline level. ? Two hours after you drink a beverage that contains glucose. You may be diagnosed with prediabetes:  If your FBG is 100?125 mg/dL (6.0-7.3 mmol/L).  If your A1c level is 5.7?6.4%.  If your OGTT result is 140?199 mg/dL (7.1-06 mmol/L). These blood tests may be repeated to confirm your diagnosis. How can this condition affect me? The pancreas produces a hormone (insulin) that helps to move glucose from the bloodstream into cells. When cells in the body do not respond properly to insulin that the body makes (insulin resistance), excess glucose builds up in the blood instead of going into cells. As a result, high blood glucose (hyperglycemia) can develop, which can cause many complications. Hyperglycemia is a symptom of prediabetes. Having high blood glucose for a long time is dangerous. Too much glucose in your blood can damage your nerves and blood vessels. Long-term damage can lead to complications from diabetes, which may include:  Heart disease.  Stroke.  Blindness.  Kidney disease.  Depression.  Poor circulation in the feet and legs, which could lead to surgical removal (amputation) in severe cases. What can increase my risk? Risk factors for prediabetes include:  Having a family member with type 2 diabetes.  Being overweight or obese.  Being older than age 63.  Being of American [place], African-American, Hispanic/Latino, or Asian/Pacific Islander descent.  Having an inactive (sedentary) lifestyle.  Having a history of heart disease.  History of gestational diabetes or polycystic ovary syndrome (PCOS), in women.  Having low levels of good cholesterol  (HDL-C) or high levels of blood fats (triglycerides).  Having high blood pressure. What actions can I take to prevent diabetes?      Be physically active. ? Do moderate-intensity physical activity for 30 or more minutes on 5 or more days of the week, or as much as told by your health care provider. This could be brisk walking, biking, or water aerobics. ? Ask your health care provider what activities are safe for you. A mix of physical activities may be best, such as walking, swimming, cycling, and strength training.  Lose weight as told by your health care provider. ? Losing 5-7% of your body weight can reverse insulin resistance. ? Your health care provider can determine how much weight loss is best for you and can help you lose weight safely.  Follow a healthy meal plan. This includes eating lean proteins, complex carbohydrates, fresh fruits and vegetables, low-fat dairy products, and healthy fats. ? Follow instructions from your health care provider about eating or drinking restrictions. ? Make an appointment to see a diet and nutrition specialist (registered dietitian) to  help you create a healthy eating plan that is right for you.  Do not smoke or use any tobacco products, such as cigarettes, chewing tobacco, and e-cigarettes. If you need help quitting, ask your health care provider.  Take over-the-counter and prescription medicines as told by your health care provider. You may be prescribed medicines that help lower the risk of type 2 diabetes.  Keep all follow-up visits as told by your health care provider. This is important. Summary  Prediabetes is the condition of having a blood sugar (blood glucose) level that is higher than it should be, but not high enough for you to be diagnosed with type 2 diabetes.  Having prediabetes puts you at risk for developing type 2 diabetes (type 2 diabetes mellitus).  To help prevent type 2 diabetes, make lifestyle changes such as being  physically active and eating a healthy diet. Lose weight as told by your health care provider. This information is not intended to replace advice given to you by your health care provider. Make sure you discuss any questions you have with your health care provider. Document Revised: 02/03/2019 Document Reviewed: 12/03/2015 Elsevier Patient Education  [place].

## 2020-02-09 DIAGNOSIS — Z03818 Encounter for observation for suspected exposure to other biological agents ruled out: Secondary | ICD-10-CM | POA: Diagnosis not present

## 2020-02-09 DIAGNOSIS — Z20828 Contact with and (suspected) exposure to other viral communicable diseases: Secondary | ICD-10-CM | POA: Diagnosis not present

## 2020-06-03 ENCOUNTER — Ambulatory Visit (INDEPENDENT_AMBULATORY_CARE_PROVIDER_SITE_OTHER): Payer: BC Managed Care – PPO | Admitting: Family Medicine

## 2020-06-03 ENCOUNTER — Other Ambulatory Visit: Payer: Self-pay

## 2020-06-03 ENCOUNTER — Encounter: Payer: Self-pay | Admitting: Family Medicine

## 2020-06-03 VITALS — BP 128/84 | HR 94 | Temp 98.8°F | Resp 14 | Ht 59.0 in | Wt 185.1 lb

## 2020-06-03 DIAGNOSIS — B3731 Acute candidiasis of vulva and vagina: Secondary | ICD-10-CM

## 2020-06-03 DIAGNOSIS — Z6837 Body mass index (BMI) 37.0-37.9, adult: Secondary | ICD-10-CM

## 2020-06-03 DIAGNOSIS — F419 Anxiety disorder, unspecified: Secondary | ICD-10-CM

## 2020-06-03 DIAGNOSIS — Z1159 Encounter for screening for other viral diseases: Secondary | ICD-10-CM | POA: Diagnosis not present

## 2020-06-03 DIAGNOSIS — B373 Candidiasis of vulva and vagina: Secondary | ICD-10-CM

## 2020-06-03 DIAGNOSIS — E669 Obesity, unspecified: Secondary | ICD-10-CM

## 2020-06-03 DIAGNOSIS — Z124 Encounter for screening for malignant neoplasm of cervix: Secondary | ICD-10-CM

## 2020-06-03 DIAGNOSIS — Z0000 Encounter for general adult medical examination without abnormal findings: Secondary | ICD-10-CM | POA: Diagnosis not present

## 2020-06-03 DIAGNOSIS — I10 Essential (primary) hypertension: Secondary | ICD-10-CM | POA: Diagnosis not present

## 2020-06-03 DIAGNOSIS — R7303 Prediabetes: Secondary | ICD-10-CM

## 2020-06-03 DIAGNOSIS — F320 Major depressive disorder, single episode, mild: Secondary | ICD-10-CM

## 2020-06-03 DIAGNOSIS — F439 Reaction to severe stress, unspecified: Secondary | ICD-10-CM

## 2020-06-03 MED ORDER — BUSPIRONE HCL 5 MG PO TABS: 5.0000 mg | ORAL_TABLET | Freq: Two times a day (BID) | ORAL | 2 refills | Status: DC | PRN

## 2020-06-03 MED ORDER — LISINOPRIL 10 MG PO TABS: 10.0000 mg | ORAL_TABLET | Freq: Every day | ORAL | 3 refills | Status: DC

## 2020-06-03 MED ORDER — FLUCONAZOLE 150 MG PO TABS: 150.0000 mg | ORAL_TABLET | ORAL | 0 refills | Status: DC | PRN

## 2020-06-03 NOTE — Patient Instructions (Signed)
Preventive Care 21-31 Years Old, Female Preventive care refers to visits with your health care provider and lifestyle choices that can promote health and wellness. This includes:  A yearly physical exam. This may also be called an annual well check.  Regular dental visits and eye exams.  Immunizations.  Screening for certain conditions.  Healthy lifestyle choices, such as eating a healthy diet, getting regular exercise, not using drugs or products that contain nicotine and tobacco, and limiting alcohol use. What can I expect for my preventive care visit? Physical exam Your health care provider will check your:  Height and weight. This may be used to calculate body mass index (BMI), which tells if you are at a healthy weight.  Heart rate and blood pressure.  Skin for abnormal spots. Counseling Your health care provider may ask you questions about your:  Alcohol, tobacco, and drug use.  Emotional well-being.  Home and relationship well-being.  Sexual activity.  Eating habits.  Work and work environment.  Method of birth control.  Menstrual cycle.  Pregnancy history. What immunizations do I need?  Influenza (flu) vaccine  This is recommended every year. Tetanus, diphtheria, and pertussis (Tdap) vaccine  You may need a Td booster every 10 years. Varicella (chickenpox) vaccine  You may need this if you have not been vaccinated. Human papillomavirus (HPV) vaccine  If recommended by your health care provider, you may need three doses over 6 months. Measles, mumps, and rubella (MMR) vaccine  You may need at least one dose of MMR. You may also need a second dose. Meningococcal conjugate (MenACWY) vaccine  One dose is recommended if you are age 19-21 years and a first-year college student living in a residence hall, or if you have one of several medical conditions. You may also need additional booster doses. Pneumococcal conjugate (PCV13) vaccine  You may need  this if you have certain conditions and were not previously vaccinated. Pneumococcal polysaccharide (PPSV23) vaccine  You may need one or two doses if you smoke cigarettes or if you have certain conditions. Hepatitis A vaccine  You may need this if you have certain conditions or if you travel or work in places where you may be exposed to hepatitis A. Hepatitis B vaccine  You may need this if you have certain conditions or if you travel or work in places where you may be exposed to hepatitis B. Haemophilus influenzae type b (Hib) vaccine  You may need this if you have certain conditions. You may receive vaccines as individual doses or as more than one vaccine together in one shot (combination vaccines). Talk with your health care provider about the risks and benefits of combination vaccines. What tests do I need?  Blood tests  Lipid and cholesterol levels. These may be checked every 5 years starting at age 20.  Hepatitis C test.  Hepatitis B test. Screening  Diabetes screening. This is done by checking your blood sugar (glucose) after you have not eaten for a while (fasting).  Sexually transmitted disease (STD) testing.  BRCA-related cancer screening. This may be done if you have a family history of breast, ovarian, tubal, or peritoneal cancers.  Pelvic exam and Pap test. This may be done every 3 years starting at age 21. Starting at age 30, this may be done every 5 years if you have a Pap test in combination with an HPV test. Talk with your health care provider about your test results, treatment options, and if necessary, the need for more tests.   Follow these instructions at home: Eating and drinking   Eat a diet that includes fresh fruits and vegetables, whole grains, lean protein, and low-fat dairy.  Take vitamin and mineral supplements as recommended by your health care provider.  Do not drink alcohol if: ? Your health care provider tells you not to drink. ? You are  pregnant, may be pregnant, or are planning to become pregnant.  If you drink alcohol: ? Limit how much you have to 0-1 drink a day. ? Be aware of how much alcohol is in your drink. In the [place] one drink equals one 12 oz bottle of beer (355 mL), one 5 oz glass of wine (148 mL), or one 1 oz glass of hard liquor (44 mL). Lifestyle  Take daily care of your teeth and gums.  Stay active. Exercise for at least 30 minutes on 5 or more days each week.  Do not use any products that contain nicotine or tobacco, such as cigarettes, e-cigarettes, and chewing tobacco. If you need help quitting, ask your health care provider.  If you are sexually active, practice safe sex. Use a condom or other form of birth control (contraception) in order to prevent pregnancy and STIs (sexually transmitted infections). If you plan to become pregnant, see your health care provider for a preconception visit. What's next?  Visit your health care provider once a year for a well check visit.  Ask your health care provider how often you should have your eyes and teeth checked.  Stay up to date on all vaccines. This information is not intended to replace advice given to you by your health care provider. Make sure you discuss any questions you have with your health care provider. Document Revised: 06/23/2018 Document Reviewed: 06/23/2018 Elsevier Patient Education  2020 [employer].

## 2020-06-03 NOTE — Progress Notes (Signed)
Patient: Kim Miles, Female    DOB: Jul 27, 1991, 29 y.o.   MRN: [id number] Delsa Grana, PA-C Visit Date: 06/03/2020  Today's Provider: Delsa Grana, PA-C   Chief Complaint  Patient presents with  . Annual Exam    with pap   Subjective:   Annual physical exam:  Kim Miles is a 29 y.o. female who presents today for complete physical exam:  Exercise/Activity:  Still working out - hikes Diet/nutrition:  Still working on [employer], still cooking at home Sleep:  Sleeping good  Pt wished to do routine f/up on chronic conditions today in addition to CPE. Advised pt of separate visit billing/coding  Hypertension:  Currently managed on lisinopril 10 mg Pt reports good med compliance and denies any SE.  No lightheadedness, hypotension, syncope. Blood pressure today is well controlled. BP Readings from Last 3 Encounters:  06/03/20 128/84  01/01/20 130/86  08/29/19 120/82   Pt denies CP, SOB, exertional sx, LE edema, palpitation, Ha's, visual disturbances  Prediabetes - she has been working on diet and exercise She has gained a little weight A lot of stress with wedding, moving, change in jobs, having to manage most of these changes Wt Readings from Last 5 Encounters:  06/03/20 185 lb 1.6 oz (84 kg)  01/01/20 182 lb 1.6 oz (82.6 kg)  08/29/19 185 lb 14.4 oz (84.3 kg)  08/15/19 187 lb 1.6 oz (84.9 kg)   BMI Readings from Last 5 Encounters:  06/03/20 37.39 kg/m  01/01/20 36.78 kg/m  08/29/19 37.55 kg/m  08/15/19 37.79 kg/m   USPSTF grade A and B recommendations - reviewed and addressed today  Depression:  Phq 9 completed today by patient, was reviewed by me with patient in the room PHQ score is positive, pt feels poor sleep, stressed but she does not feel depressed, just a lot going on, she continues to take lexapro and feels it works well PHQ 2/9 Scores 06/03/2020 01/01/2020 08/29/2019 08/21/2019  PHQ - 2 Score 4 0 0 2  PHQ- 9 Score 8 0 0 9   Depression  screen [facility] 2/9 06/03/2020 01/01/2020 08/29/2019 08/21/2019  Decreased Interest 2 0 0 1  Down, Depressed, Hopeless 2 0 0 1  PHQ - 2 Score 4 0 0 2  Altered sleeping 2 0 0 2  Tired, decreased energy 2 0 0 3  Change in appetite 0 0 0 0  Feeling bad or failure about yourself  0 0 0 1  Trouble concentrating 0 0 0 0  Moving slowly or fidgety/restless 0 0 0 1  Suicidal thoughts 0 0 0 0  PHQ-9 Score 8 0 0 9  Difficult doing work/chores Somewhat difficult Not difficult at all Not difficult at all Very difficult    Alcohol screening:   Office Visit from 06/03/2020 in [facility]  AUDIT-C Score 0      Immunizations and Health Maintenance: Health Maintenance  Topic Date Due  . Hepatitis C Screening  Never done  . PAP SMEAR-Modifier  Never done  . INFLUENZA VACCINE  05/26/2020  . TETANUS/TDAP  08/28/2020 (Originally 01/20/2008)  . HIV Screening  08/28/2020 (Originally 01/20/2004)  . COVID-19 Vaccine  Completed     Hep C Screening:  due today  STD testing and prevention (HIV/chl/gon/syphilis):  see above, no additional testing desired by pt today-   Intimate partner violence:  Denies   Sexual History/Pain during Intercourse: Significant Other- not sexually active  Menstrual History/LMP/Abnormal Bleeding:  Irregular, discussed OCP for  pregnancy prevention No LMP recorded. (Menstrual status: Irregular Periods).  Incontinence Symptoms: none  Breast cancer:  Last Mammogram: *see HM list above BRCA gene screening: none  Breast CA maternal grandmother    Cervical cancer screening: tried to do pap today, but unable to tolerate - no hx of sexual activity  Pt denies family hx of cancers - breast, ovarian, uterine, colon:     Osteoporosis:   Discussion on osteoporosis per age, including high calcium and vitamin D supplementation, weight bearing exercises   Skin cancer:  Hx of skin CA -  NO Discussed atypical lesions   Colorectal cancer:   Colonoscopy is not due for  age  Discussed concerning signs and sx of CRC, pt denies   Lung cancer:   Low Dose CT Chest recommended if Age 42-80 years, 30 pack-year currently smoking OR have quit w/in 15years. Patient does not qualify.    Social History   Tobacco Use  . Smoking status: Never Smoker  . Smokeless tobacco: Never Used  Vaping Use  . Vaping Use: Never used  Substance Use Topics  . Alcohol use: Never  . Drug use: Never       Office Visit from 06/03/2020 in [facility]  AUDIT-C Score 0      Family History  Problem Relation Age of Onset  . Asthma Mother   . Hypertension Father   . Anemia Sister   . Eating disorder Sister   . Cancer Maternal Grandmother   . Cancer Maternal Grandfather   . Diabetes Maternal Grandfather   . Alzheimer's disease Maternal Grandfather   . Heart attack Paternal Grandmother   . Arthritis Paternal Grandmother      Blood pressure/Hypertension: BP Readings from Last 3 Encounters:  06/03/20 128/84  01/01/20 130/86  08/29/19 120/82    Weight/Obesity: Wt Readings from Last 3 Encounters:  06/03/20 185 lb 1.6 oz (84 kg)  01/01/20 182 lb 1.6 oz (82.6 kg)  08/29/19 185 lb 14.4 oz (84.3 kg)   BMI Readings from Last 3 Encounters:  06/03/20 37.39 kg/m  01/01/20 36.78 kg/m  08/29/19 37.55 kg/m     Lipids:  No results found for: CHOL No results found for: HDL No results found for: LDLCALC No results found for: TRIG No results found for: CHOLHDL No results found for: LDLDIRECT Based on the results of lipid panel his/her cardiovascular risk factor ( using [place] )  in the next 10 years is: The ASCVD Risk score Mikey Bussing DC Jr., et al., 2013) failed to calculate for the following reasons:   The 2013 ASCVD risk score is only valid for ages 71 to 41 Glucose:  Glucose, Bld  Date Value Ref Range Status  08/15/2019 91 65 - 99 mg/dL Final    Comment:    .            Fasting reference interval .    Hypertension: BP Readings from Last 3  Encounters:  06/03/20 128/84  01/01/20 130/86  08/29/19 120/82   Obesity: Wt Readings from Last 3 Encounters:  06/03/20 185 lb 1.6 oz (84 kg)  01/01/20 182 lb 1.6 oz (82.6 kg)  08/29/19 185 lb 14.4 oz (84.3 kg)   BMI Readings from Last 3 Encounters:  06/03/20 37.39 kg/m  01/01/20 36.78 kg/m  08/29/19 37.55 kg/m      Advanced Care Planning:  A voluntary discussion about advance care planning including the explanation and discussion of advance directives.    Social History  She        Social History   Socioeconomic History  . Marital status: Significant Other    Spouse name: Dominiqua Cooner  . Number of children: 0  . Years of education: 15  . Highest education level: Master's degree (e.g., MA, MS, MEng, MEd, MSW, MBA)  Occupational History  . Not on file  Tobacco Use  . Smoking status: Never Smoker  . Smokeless tobacco: Never Used  Vaping Use  . Vaping Use: Never used  Substance and Sexual Activity  . Alcohol use: Never  . Drug use: Never  . Sexual activity: Never  Other Topics Concern  . Not on file  Social History Narrative  . Not on file   Social Determinants of Health   Financial Resource Strain: Low Risk   . Difficulty of Paying Living Expenses: Not hard at all  Food Insecurity: No Food Insecurity  . Worried About Charity fundraiser in the Last Year: Never true  . Ran Out of Food in the Last Year: Never true  Transportation Needs: No Transportation Needs  . Lack of Transportation (Medical): No  . Lack of Transportation (Non-Medical): No  Physical Activity: Inactive  . Days of Exercise per Week: 0 days  . Minutes of Exercise per Session: 0 min  Stress: No Stress Concern Present  . Feeling of Stress : Only a little  Social Connections: Moderately Isolated  . Frequency of Communication with Friends and Family: More than three times a week  . Frequency of Social Gatherings with Friends and Family: Once a week  . Attends Religious Services: More  than 4 times per year  . Active Member of Clubs or Organizations: No  . Attends Archivist Meetings: Never  . Marital Status: Never married    Family History        Family History  Problem Relation Age of Onset  . Asthma Mother   . Hypertension Father   . Anemia Sister   . Eating disorder Sister   . Cancer Maternal Grandmother   . Cancer Maternal Grandfather   . Diabetes Maternal Grandfather   . Alzheimer's disease Maternal Grandfather   . Heart attack Paternal Grandmother   . Arthritis Paternal Grandmother     Patient Active Problem List   Diagnosis Date Noted  . Prediabetes 01/01/2020  . Current mild episode of major depressive disorder ([place]) 01/01/2020  . Menstrual migraine without status migrainosus, not intractable 01/01/2020  . Class 2 obesity with body mass index (BMI) of 36.0 to 36.9 in adult 01/01/2020  . High blood pressure 04/26/2019  . Primary dysmenorrhea 03/31/2017    History reviewed. No pertinent surgical history.   Current Outpatient Medications:  .  escitalopram (LEXAPRO) 10 MG tablet, Take 1 tablet (10 mg total) by mouth at bedtime., Disp: 90 tablet, Rfl: 3 .  lisinopril (ZESTRIL) 10 MG tablet, Take 1 tablet (10 mg total) by mouth daily., Disp: 90 tablet, Rfl: 3 .  norethindrone-ethinyl estradiol (LOESTRIN FE) 1-20 MG-MCG tablet, Take 1 tablet by mouth daily., Disp: 1 Package, Rfl: 11  No Known Allergies  Patient Care Team: Delsa Grana, PA-C as PCP - General (Family Medicine)  Review of Systems  Constitutional: Negative.   HENT: Negative.   Eyes: Negative.   Respiratory: Negative.   Cardiovascular: Negative.   Gastrointestinal: Negative.   Endocrine: Negative.   Genitourinary: Negative.   Musculoskeletal: Negative.   Skin: Negative.   Allergic/Immunologic: Negative.   Neurological: Negative.   Hematological: Negative.  Psychiatric/Behavioral: Negative.   All other systems reviewed and are negative.    I personally reviewed  active problem list, medication list, allergies, family history, social history, health maintenance, notes from last encounter, lab results, imaging with the patient/caregiver today.        Objective:   Vitals:  Vitals:   06/03/20 1517  BP: 128/84  Pulse: 94  Resp: 14  Temp: 98.8 F (37.1 C)  SpO2: 96%  Weight: 185 lb 1.6 oz (84 kg)  Height: _0  (1.499 m)    Body mass index is 37.39 kg/m.  Physical Exam Vitals and nursing note reviewed. Exam conducted with a chaperone present.  Constitutional:      General: She is not in acute distress.    Appearance: Normal appearance. She is well-developed. She is obese. She is not ill-appearing, toxic-appearing or diaphoretic.  HENT:     Head: Normocephalic and atraumatic.     Right Ear: External ear normal.     Left Ear: External ear normal.     Nose: Nose normal.     Mouth/Throat:     Pharynx: Uvula midline.  Eyes:     General: Lids are normal.     Conjunctiva/sclera: Conjunctivae normal.     Pupils: Pupils are equal, round, and reactive to light.  Neck:     Trachea: Phonation normal. No tracheal deviation.  Cardiovascular:     Rate and Rhythm: Normal rate and regular rhythm.     Pulses: Normal pulses.          Radial pulses are 2+ on the right side and 2+ on the left side.       Posterior tibial pulses are 2+ on the right side and 2+ on the left side.     Heart sounds: Normal heart sounds. No murmur heard.  No friction rub. No gallop.   Pulmonary:     Effort: Pulmonary effort is normal. No respiratory distress.     Breath sounds: Normal breath sounds. No stridor. No wheezing, rhonchi or rales.  Chest:     Chest wall: No tenderness.  Abdominal:     General: Bowel sounds are normal. There is no distension.     Palpations: Abdomen is soft.     Tenderness: There is no abdominal tenderness. There is no guarding or rebound.  Genitourinary:    General: Normal vulva.     Vagina: No signs of injury. Vaginal discharge and  tenderness present. No bleeding.     Comments: Vaginal discharge - white with some notes of mild intravaginal erythema/irritation  Unable to do PAP - pt reported pain with introduction of speculum, PAP/pelvic was stopped  Musculoskeletal:        General: No deformity.     Cervical back: Normal range of motion and neck supple.     Right lower leg: No edema.     Left lower leg: No edema.  Lymphadenopathy:     Cervical: No cervical adenopathy.  Skin:    General: Skin is warm and dry.     Capillary Refill: Capillary refill takes less than 2 seconds.     Coloration: Skin is not jaundiced or pale.     Findings: No rash.  Neurological:     Mental Status: She is alert. Mental status is at baseline.     Motor: No abnormal muscle tone.     Gait: Gait normal.  Psychiatric:        Mood and Affect: Mood normal.  Speech: Speech normal.        Behavior: Behavior normal.       Fall Risk: Fall Risk  06/03/2020 01/01/2020 08/29/2019 08/15/2019  Falls in the past year? 0 0 0 0  Number falls in past yr: 0 0 0 0  Injury with Fall? 0 0 0 0    Functional Status Survey: Is the patient deaf or have difficulty hearing?: No Does the patient have difficulty seeing, even when wearing glasses/contacts?: No Does the patient have difficulty concentrating, remembering, or making decisions?: No Does the patient have difficulty walking or climbing stairs?: No Does the patient have difficulty dressing or bathing?: No Does the patient have difficulty doing errands alone such as visiting a doctor's office or shopping?: No   Assessment & Plan:    CPE completed today  . USPSTF grade A and B recommendations reviewed with patient; age-appropriate recommendations, preventive care, screening tests, etc discussed and encouraged; healthy living encouraged; see AVS for patient education given to patient  . Discussed importance of 150 minutes of physical activity weekly, AHA exercise recommendations given to pt  in AVS/handout  . Discussed importance of healthy diet:  eating lean meats and proteins, avoiding trans fats and saturated fats, avoid simple sugars and excessive carbs in diet, eat 6 servings of fruit/vegetables daily and drink plenty of water and avoid sweet beverages.    . Recommended pt to do annual eye exam and routine dental exams/cleanings  . Depression, alcohol, fall screening completed as documented above and per flowsheets  . Reviewed Health Maintenance: Health Maintenance  Topic Date Due  . Hepatitis C Screening  Never done  . PAP SMEAR-Modifier  Never done  . INFLUENZA VACCINE  05/26/2020  . TETANUS/TDAP  08/28/2020 (Originally 01/20/2008)  . HIV Screening  08/28/2020 (Originally 01/20/2004)  . COVID-19 Vaccine  Completed    . Immunizations: Immunization History  Administered Date(s) Administered  . PFIZER SARS-COV-2 Vaccination 01/24/2020, 02/22/2020     ICD-10-CM   1. Annual physical exam  Z00.00 CBC w/Diff/Platelet    Lipid panel    CANCELED: Cytology - PAP  2. Encounter for hepatitis C screening test for low risk patient  Z11.59 Hepatitis C Antibody  3. Hypertension, unspecified type  B44 COMPLETE METABOLIC PANEL WITH GFR    lisinopril (ZESTRIL) 10 MG tablet   stable, well controlled on lisinopril 10 mg, continue meds and diet/lifestyle efforts  4. Prediabetes  H67.59 COMPLETE METABOLIC PANEL WITH GFR    Hemoglobin A1C   recheck  5. Cervical cancer screening  Z12.4 CANCELED: Cytology - PAP   attempted pelvic/speculum exam - pt had vaginismus -did not tolerate, offered to refer her to OB/GYN  6. Current mild episode of major depressive disorder, unspecified whether recurrent (HCC)  F32.0    PHQ screening is positive today she has increased stress but does not feel more depressed she will continue Lexapro  7. Class 2 obesity with body mass index (BMI) of 37.0 to 37.9 in adult, unspecified obesity type, unspecified whether serious comorbidity present  E66.9     Z68.37    Encouraged her to continue to work on diet & exercise efforts  8. Anxiety  F41.9 busPIRone (BUSPAR) 5 MG tablet   Trial of BuSpar in addition to Lexapro  9. Situational stress  F43.9 busPIRone (BUSPAR) 5 MG tablet   Multiple life changes occurring over the past several months and in the next few months that she is getting married and moving, job change  10. Yeast vaginitis  B37.3 fluconazole (DIFLUCAN) 150 MG tablet   Noted on exam today     Return in about 6 months (around 12/04/2020) for Routine follow-up.   offered GYN referral but pt politely declined at this time  Delsa Grana, Hershal Coria 06/03/20 3:50 PM  [place]

## 2020-06-04 LAB — LIPID PANEL
Cholesterol: 276 mg/dL — ABNORMAL HIGH (ref <200)
HDL: 51 mg/dL (ref >=50)
LDL Cholesterol (Calc): 173 mg/dL (calc) — ABNORMAL HIGH
Non-HDL Cholesterol (Calc): 225 mg/dL (calc) — ABNORMAL HIGH (ref <130)
Total CHOL/HDL Ratio: 5.4 (calc) — ABNORMAL HIGH (ref <5.0)
Triglycerides: 307 mg/dL — ABNORMAL HIGH (ref <150)

## 2020-06-04 LAB — CBC WITH DIFFERENTIAL/PLATELET
Absolute Monocytes: 803 cells/uL (ref 200–950)
Basophils Absolute: 75 cells/uL (ref 0–200)
Basophils Relative: 0.7 %
Eosinophils Absolute: 107 cells/uL (ref 15–500)
Eosinophils Relative: 1 %
HCT: 41.9 % (ref 35.0–45.0)
Hemoglobin: 14.2 g/dL (ref 11.7–15.5)
Lymphs Abs: 3371 cells/uL (ref 850–3900)
MCH: 30 pg (ref 27.0–33.0)
MCHC: 33.9 g/dL (ref 32.0–36.0)
MCV: 88.4 fL (ref 80.0–100.0)
MPV: 10.1 fL (ref 7.5–12.5)
Monocytes Relative: 7.5 %
Neutro Abs: 6345 cells/uL (ref 1500–7800)
Neutrophils Relative %: 59.3 %
Platelets: 381 10*3/uL (ref 140–400)
RBC: 4.74 10*6/uL (ref 3.80–5.10)
RDW: 12.6 % (ref 11.0–15.0)
Total Lymphocyte: 31.5 %
WBC: 10.7 10*3/uL (ref 3.8–10.8)

## 2020-06-04 LAB — COMPLETE METABOLIC PANEL WITH GFR
AG Ratio: 1.4 (calc) (ref 1.0–2.5)
ALT: 9 U/L (ref 6–29)
AST: 16 U/L (ref 10–30)
Albumin: 4.1 g/dL (ref 3.6–5.1)
Alkaline phosphatase (APISO): 68 U/L (ref 31–125)
BUN: 14 mg/dL (ref 7–25)
CO2: 28 mmol/L (ref 20–32)
Calcium: 9.8 mg/dL (ref 8.6–10.2)
Chloride: 101 mmol/L (ref 98–110)
Creat: 0.75 mg/dL (ref 0.50–1.10)
GFR, Est African American: 123 mL/min/{1.73_m2} (ref >=60)
GFR, Est Non African American: 106 mL/min/{1.73_m2} (ref >=60)
Globulin: 3 g/dL (calc) (ref 1.9–3.7)
Glucose, Bld: 103 mg/dL — ABNORMAL HIGH (ref 65–99)
Potassium: 4.5 mmol/L (ref 3.5–5.3)
Sodium: 137 mmol/L (ref 135–146)
Total Bilirubin: 0.2 mg/dL (ref 0.2–1.2)
Total Protein: 7.1 g/dL (ref 6.1–8.1)

## 2020-06-04 LAB — HEMOGLOBIN A1C
Hgb A1c MFr Bld: 5.8 % of total Hgb — ABNORMAL HIGH (ref <5.7)
Mean Plasma Glucose: 120 (calc)
eAG (mmol/L): 6.6 (calc)

## 2020-06-04 LAB — HEPATITIS C ANTIBODY
Hepatitis C Ab: NONREACTIVE
SIGNAL TO CUT-OFF: 0.01 (ref <1.00)

## 2020-06-12 ENCOUNTER — Other Ambulatory Visit: Payer: Self-pay | Admitting: Family Medicine

## 2020-06-12 DIAGNOSIS — F419 Anxiety disorder, unspecified: Secondary | ICD-10-CM

## 2020-06-12 DIAGNOSIS — F439 Reaction to severe stress, unspecified: Secondary | ICD-10-CM

## 2020-07-16 DIAGNOSIS — Z20822 Contact with and (suspected) exposure to covid-19: Secondary | ICD-10-CM | POA: Diagnosis not present

## 2020-10-22 ENCOUNTER — Other Ambulatory Visit: Payer: Self-pay | Admitting: Family Medicine

## 2020-10-22 MED ORDER — ESCITALOPRAM OXALATE 10 MG PO TABS: 10.0000 mg | ORAL_TABLET | Freq: Every day | ORAL | 0 refills | Status: DC

## 2020-10-22 NOTE — Telephone Encounter (Signed)
Medication Refill - Medication: Lexapro generic  Has the patient contacted their pharmacy? No. (Agent: If no, request that the patient contact the pharmacy for the refill.) (Agent: If yes, when and what did the pharmacy advise?)  Preferred Pharmacy (with phone number or street name): [facility] [address] [id number] phone number [phone]  Agent: Please be advised that RX refills may take up to 3 business days. We ask that you follow-up with your pharmacy.

## 2020-11-20 ENCOUNTER — Other Ambulatory Visit: Payer: Self-pay | Admitting: Family Medicine

## 2020-12-10 ENCOUNTER — Ambulatory Visit: Payer: BC Managed Care – PPO | Admitting: Family Medicine

## 2021-03-05 LAB — HM PAP SMEAR: HM Pap smear: NEGATIVE

## 2021-04-29 ENCOUNTER — Other Ambulatory Visit: Payer: Self-pay | Admitting: Family Medicine

## 2021-04-30 NOTE — Telephone Encounter (Signed)
Pt needs appt 30 day refill given for depression med

## 2021-05-27 ENCOUNTER — Encounter: Payer: Self-pay | Admitting: Family Medicine

## 2021-05-27 ENCOUNTER — Telehealth (INDEPENDENT_AMBULATORY_CARE_PROVIDER_SITE_OTHER): Payer: Self-pay | Admitting: Family Medicine

## 2021-05-27 ENCOUNTER — Other Ambulatory Visit: Payer: Self-pay

## 2021-05-27 DIAGNOSIS — I10 Essential (primary) hypertension: Secondary | ICD-10-CM

## 2021-05-27 DIAGNOSIS — J069 Acute upper respiratory infection, unspecified: Secondary | ICD-10-CM

## 2021-05-27 DIAGNOSIS — F320 Major depressive disorder, single episode, mild: Secondary | ICD-10-CM

## 2021-05-27 MED ORDER — ESCITALOPRAM OXALATE 10 MG PO TABS: 10.0000 mg | ORAL_TABLET | Freq: Every day | ORAL | 0 refills | Status: DC

## 2021-05-27 MED ORDER — AMLODIPINE BESYLATE 5 MG PO TABS: 5.0000 mg | ORAL_TABLET | Freq: Every day | ORAL | 0 refills | Status: DC

## 2021-05-27 MED ORDER — LISINOPRIL 10 MG PO TABS: 10.0000 mg | ORAL_TABLET | Freq: Every day | ORAL | 0 refills | Status: DC

## 2021-05-27 NOTE — Patient Instructions (Signed)
It was great to see you!  Our plans for today:  - See below for self-isolation guidelines. You may end your quarantine if your test is negative or, if positive, once you are 5 days from symptom onset and fever free for 24 hours without use of tylenol or ibuprofen. Wear a well-fitting N95 mask for an additional 5 days. - If your test is positive, we will be referring you for COVID treatment. - Certainly, if you are having difficulties breathing or unable to keep down fluids, go to the Emergency Department.   - Stop taking lisinopril. Take the amlodipine daily.  - Follow up in clinic in the next few weeks.  Take care and seek immediate care sooner if you develop any concerns.   Dr. Linwood Dibbles     Person Under Monitoring Name: Kim Miles  Location: [address] [place] [address]   Infection Prevention Recommendations for Individuals Confirmed to have, or Being Evaluated for, 2019 Novel Coronavirus (COVID-19) Infection Who Receive Care at Home  Individuals who are confirmed to have, or are being evaluated for, COVID-19 should follow the prevention steps below until a healthcare provider or local or state health department says they can return to normal activities.  Stay home except to get medical care You should restrict activities outside your home, except for getting medical care. Do not go to work, school, or public areas, and do not use public transportation or taxis.  Call ahead before visiting your doctor Before your medical appointment, call the healthcare provider and tell them that you have, or are being evaluated for, COVID-19 infection. This will help the healthcare provider's office take steps to keep other people from getting infected. Ask your healthcare provider to call the local or state health department.  Monitor your symptoms Seek prompt medical attention if your illness is worsening (e.g., difficulty breathing). Before going to your  medical appointment, call the healthcare provider and tell them that you have, or are being evaluated for, COVID-19 infection. Ask your healthcare provider to call the local or state health department.  Wear a facemask You should wear a facemask that covers your nose and mouth when you are in the same room with other people and when you visit a healthcare provider. People who live with or visit you should also wear a facemask while they are in the same room with you.  Separate yourself from other people in your home As much as possible, you should stay in a different room from other people in your home. Also, you should use a separate bathroom, if available.  Avoid sharing household items You should not share dishes, drinking glasses, cups, eating utensils, towels, bedding, or other items with other people in your home. After using these items, you should wash them thoroughly with soap and water.  Cover your coughs and sneezes Cover your mouth and nose with a tissue when you cough or sneeze, or you can cough or sneeze into your sleeve. Throw used tissues in a lined trash can, and immediately wash your hands with soap and water for at least 20 seconds or use an alcohol-based hand rub.  Wash your [employer] your hands often and thoroughly with soap and water for at least 20 seconds. You can use an alcohol-based hand sanitizer if soap and water are not available and if your hands are not visibly dirty. Avoid touching your eyes, nose, and mouth with unwashed hands.   Prevention Steps for Caregivers and Household Members of  Individuals Confirmed to have, or Being Evaluated for, COVID-19 Infection Being Cared for in the Home  If you live with, or provide care at home for, a person confirmed to have, or being evaluated for, COVID-19 infection please follow these guidelines to prevent infection:  Follow healthcare provider's instructions Make sure that you understand and can help the  patient follow any healthcare provider instructions for all care.  Provide for the patient's basic needs You should help the patient with basic needs in the home and provide support for getting groceries, prescriptions, and other personal needs.  Monitor the patient's symptoms If they are getting sicker, call his or her medical provider and tell them that the patient has, or is being evaluated for, COVID-19 infection. This will help the healthcare provider's office take steps to keep other people from getting infected. Ask the healthcare provider to call the local or state health department.  Limit the number of people who have contact with the patient If possible, have only one caregiver for the patient. Other household members should stay in another home or place of residence. If this is not possible, they should stay in another room, or be separated from the patient as much as possible. Use a separate bathroom, if available. Restrict visitors who do not have an essential need to be in the home.  Keep older adults, very young children, and other sick people away from the patient Keep older adults, very young children, and those who have compromised immune systems or chronic health conditions away from the patient. This includes people with chronic heart, lung, or kidney conditions, diabetes, and cancer.  Ensure good ventilation Make sure that shared spaces in the home have good air flow, such as from an air conditioner or an opened window, weather permitting.  Wash your hands often Wash your hands often and thoroughly with soap and water for at least 20 seconds. You can use an alcohol based hand sanitizer if soap and water are not available and if your hands are not visibly dirty. Avoid touching your eyes, nose, and mouth with unwashed hands. Use disposable paper towels to dry your hands. If not available, use dedicated cloth towels and replace them when they become wet.  Wear a  facemask and gloves Wear a disposable facemask at all times in the room and gloves when you touch or have contact with the patient's blood, body fluids, and/or secretions or excretions, such as sweat, saliva, sputum, nasal mucus, vomit, urine, or feces.  Ensure the mask fits over your nose and mouth tightly, and do not touch it during use. Throw out disposable facemasks and gloves after using them. Do not reuse. Wash your hands immediately after removing your facemask and gloves. If your personal clothing becomes contaminated, carefully remove clothing and launder. Wash your hands after handling contaminated clothing. Place all used disposable facemasks, gloves, and other waste in a lined container before disposing them with other household waste. Remove gloves and wash your hands immediately after handling these items.  Do not share dishes, glasses, or other household items with the patient Avoid sharing household items. You should not share dishes, drinking glasses, cups, eating utensils, towels, bedding, or other items with a patient who is confirmed to have, or being evaluated for, COVID-19 infection. After the person uses these items, you should wash them thoroughly with soap and water.  Wash laundry thoroughly Immediately remove and wash clothes or bedding that have blood, body fluids, and/or secretions or excretions, such as  sweat, saliva, sputum, nasal mucus, vomit, urine, or feces, on them. Wear gloves when handling laundry from the patient. Read and follow directions on labels of laundry or clothing items and detergent. In general, wash and dry with the warmest temperatures recommended on the label.  Clean all areas the individual has used often Clean all touchable surfaces, such as counters, tabletops, doorknobs, bathroom fixtures, toilets, phones, keyboards, tablets, and bedside tables, every day. Also, clean any surfaces that may have blood, body fluids, and/or secretions or excretions  on them. Wear gloves when cleaning surfaces the patient has come in contact with. Use a diluted bleach solution (e.g., dilute bleach with 1 part bleach and 10 parts water) or a household disinfectant with a label that says EPA-registered for coronaviruses. To make a bleach solution at home, add 1 tablespoon of bleach to 1 quart (4 cups) of water. For a larger supply, add  cup of bleach to 1 gallon (16 cups) of water. Read labels of cleaning products and follow recommendations provided on product labels. Labels contain instructions for safe and effective use of the cleaning product including precautions you should take when applying the product, such as wearing gloves or eye protection and making sure you have good ventilation during use of the product. Remove gloves and wash hands immediately after cleaning.  Monitor yourself for signs and symptoms of illness Caregivers and household members are considered close contacts, should monitor their health, and will be asked to limit movement outside of the home to the extent possible. Follow the monitoring steps for close contacts listed on the symptom monitoring form.   ? If you have additional questions, contact your local health department or call the epidemiologist on call at [phone] (available 24/7). ? This guidance is subject to change. For the most up-to-date guidance from [facility], please refer to their website: [email or web address]

## 2021-05-27 NOTE — Assessment & Plan Note (Signed)
Refill provided x30 days. Encouraged in person appt for follow up once healed from current infection.

## 2021-05-27 NOTE — Progress Notes (Signed)
Virtual Visit via Video Note  I connected with Kim Miles on 05/27/21 at  8:40 AM EDT by a video enabled telemedicine application and verified that I am speaking with the correct person using two identifiers.  Location: Patient: home Provider: [facility]   I discussed the limitations of evaluation and management by telemedicine and the availability of in person appointments. The patient expressed understanding and agreed to proceed.  History of Present Illness:  UPPER RESPIRATORY TRACT INFECTION - symptom onset 7/30 - sore throat, headache, fatigue, chills  Worst symptom: Fever: yes, 102.38F Cough: no Shortness of breath: no Chest pain: no Chest tightness: no Chest congestion: no Nasal congestion:  initially, but better Runny nose: no Sneezing: no Sore throat: yes Headache: yes Ear pain: yes left Ear pressure: yes left Eyes red/itching:no Eye drainage/crusting: no  Vomiting: no Fatigue: yes Sick contacts: no Relief with OTC cold/cough medications:  hasn't tried   Treatments attempted: tylenol  Hypertension - doing well on lisinopril, requesting refill - actively trying to get pregnant  Observations/Objective:  Well appearing, in NAD. No respiratory distress, speaks in full sentences.  Assessment and Plan:  URI Doing well with mild sx. Will test for COVID, would be a candidate for COVID treatment if positive. Reviewed OTC symptom relief, self-quarantine guidelines, and emergency precautions.   High blood pressure Actively trying to get pregnant. LMP 3 weeks ago. Will change lisinopril to amlodipine given risk to fetus. F/u in clinic in 2 weeks.  Current mild episode of major depressive disorder (HCC) Refill provided x30 days. Encouraged in person appt for follow up once healed from current infection.    I discussed the assessment and treatment plan with the patient. The patient was provided an opportunity to ask questions and all were answered. The patient  agreed with the plan and demonstrated an understanding of the instructions.   The patient was advised to call back or seek an in-person evaluation if the symptoms worsen or if the condition fails to improve as anticipated.  I provided 12 minutes of non-face-to-face time during this encounter.   Caro Laroche, DO

## 2021-05-27 NOTE — Assessment & Plan Note (Addendum)
Actively trying to get pregnant. LMP 3 weeks ago. Will change lisinopril to amlodipine given risk to fetus. F/u in clinic in 2 weeks.

## 2021-06-11 ENCOUNTER — Ambulatory Visit: Payer: Managed Care, Other (non HMO) | Admitting: Family Medicine

## 2021-06-11 ENCOUNTER — Other Ambulatory Visit: Payer: Self-pay

## 2021-06-11 ENCOUNTER — Encounter: Payer: Self-pay | Admitting: Family Medicine

## 2021-06-11 VITALS — BP 124/78 | HR 98 | Temp 98.1°F | Resp 14 | Ht 59.0 in | Wt 181.7 lb

## 2021-06-11 DIAGNOSIS — F320 Major depressive disorder, single episode, mild: Secondary | ICD-10-CM

## 2021-06-11 DIAGNOSIS — Z6836 Body mass index (BMI) 36.0-36.9, adult: Secondary | ICD-10-CM

## 2021-06-11 DIAGNOSIS — Z114 Encounter for screening for human immunodeficiency virus [HIV]: Secondary | ICD-10-CM | POA: Diagnosis not present

## 2021-06-11 DIAGNOSIS — I10 Essential (primary) hypertension: Secondary | ICD-10-CM

## 2021-06-11 DIAGNOSIS — R7303 Prediabetes: Secondary | ICD-10-CM

## 2021-06-11 DIAGNOSIS — E669 Obesity, unspecified: Secondary | ICD-10-CM

## 2021-06-11 DIAGNOSIS — Z23 Encounter for immunization: Secondary | ICD-10-CM

## 2021-06-11 MED ORDER — ESCITALOPRAM OXALATE 10 MG PO TABS: 10.0000 mg | ORAL_TABLET | Freq: Every day | ORAL | 3 refills | Status: DC

## 2021-06-11 MED ORDER — AMLODIPINE BESYLATE 5 MG PO TABS: 5.0000 mg | ORAL_TABLET | Freq: Every day | ORAL | 3 refills | Status: DC

## 2021-06-11 NOTE — Progress Notes (Signed)
   SUBJECTIVE:   CHIEF COMPLAINT / HPI:   Hypertension: - Medications: amlodipine 5mg  - Compliance: good - Checking BP at home: yes, higher this past week with moving - Denies any SOB, CP, vision changes, LE edema, medication SEs, or symptoms of hypotension  Prediabetes - Last A1c 5.8 05/2020 - Medications: none - Compliance: n/a - Checking BG at home: no - Denies symptoms of hypoglycemia, polyuria, polydipsia, numbness extremities, foot ulcers/trauma  Depression - Medications: lexapro 10mg  - Taking: good compliance - Counseling: no - Previous hospitalizations: no - FH of psych illness: no - Symptoms: none - Current stressors: moving recently  Depression screen [facility] 2/9 06/11/2021 05/27/2021 06/03/2020  Decreased Interest 0 0 2  Down, Depressed, Hopeless 1 0 2  PHQ - 2 Score 1 0 4  Altered sleeping 0 0 2  Tired, decreased energy 0 0 2  Change in appetite 0 0 0  Feeling bad or failure about yourself  0 0 0  Trouble concentrating 0 0 0  Moving slowly or fidgety/restless 0 0 0  Suicidal thoughts 0 0 0  PHQ-9 Score 1 0 8  Difficult doing work/chores Not difficult at all Not difficult at all Somewhat difficult      OBJECTIVE:   BP 124/78   Pulse 98   Temp 98.1 F (36.7 C) (Oral)   Resp 14   Ht 4\' 11"  (1.499 m)   Wt 181 lb 11.2 oz (82.4 kg)   SpO2 97%   BMI 36.70 kg/m   Gen: well appearing, in NAD Card: RRR Lungs: CTAB Ext: WWP, no edema   ASSESSMENT/PLAN:   High blood pressure Doing well on current regimen, no changes made today. Obtaining labs today.  Current mild episode of major depressive disorder (HCC) Doing well on current regimen, no changes made today.  Prediabetes Recheck a1c.  Class 2 obesity with body mass index (BMI) of 36.0 to 36.9 in adult Contributing to prediabetes, HTN. Recommend weight loss with diet and exercise.      07/27/2021, DO

## 2021-06-11 NOTE — Assessment & Plan Note (Signed)
Doing well on current regimen, no changes made today. 

## 2021-06-11 NOTE — Addendum Note (Signed)
Addended by: Ozie Lupe, Sherrill Raring on: 06/11/2021 11:48 AM   Modules accepted: Orders

## 2021-06-11 NOTE — Assessment & Plan Note (Addendum)
Recheck a1c. °

## 2021-06-11 NOTE — Assessment & Plan Note (Signed)
Contributing to prediabetes, HTN. Recommend weight loss with diet and exercise.

## 2021-06-11 NOTE — Patient Instructions (Signed)
It was great to see you!  Our plans for today:  - No changes to your medications, refills sent to your pharmacy. - We are checking some labs today, we will release these results to your MyChart.  Take care and seek immediate care sooner if you develop any concerns.   Dr. Linwood Dibbles

## 2021-06-11 NOTE — Assessment & Plan Note (Signed)
Doing well on current regimen, no changes made today. Obtaining labs today. 

## 2021-06-12 LAB — LIPID PANEL
Cholesterol: 264 mg/dL — ABNORMAL HIGH (ref <200)
HDL: 46 mg/dL — ABNORMAL LOW (ref >=50)
LDL Cholesterol (Calc): 182 mg/dL (calc) — ABNORMAL HIGH
Non-HDL Cholesterol (Calc): 218 mg/dL (calc) — ABNORMAL HIGH (ref <130)
Total CHOL/HDL Ratio: 5.7 (calc) — ABNORMAL HIGH (ref <5.0)
Triglycerides: 203 mg/dL — ABNORMAL HIGH (ref <150)

## 2021-06-12 LAB — BASIC METABOLIC PANEL
BUN: 11 mg/dL (ref 7–25)
CO2: 28 mmol/L (ref 20–32)
Calcium: 9.3 mg/dL (ref 8.6–10.2)
Chloride: 101 mmol/L (ref 98–110)
Creat: 0.64 mg/dL (ref 0.50–0.97)
Glucose, Bld: 105 mg/dL — ABNORMAL HIGH (ref 65–99)
Potassium: 4.4 mmol/L (ref 3.5–5.3)
Sodium: 137 mmol/L (ref 135–146)

## 2021-06-12 LAB — HEMOGLOBIN A1C
Hgb A1c MFr Bld: 5.7 %{Hb} — ABNORMAL HIGH
Mean Plasma Glucose: 117 mg/dL
eAG (mmol/L): 6.5 mmol/L

## 2021-06-12 LAB — HIV ANTIBODY (ROUTINE TESTING W REFLEX): HIV 1&2 Ab, 4th Generation: NONREACTIVE

## 2021-11-10 ENCOUNTER — Encounter: Payer: BC Managed Care – PPO | Admitting: Family Medicine

## 2021-12-05 ENCOUNTER — Ambulatory Visit (INDEPENDENT_AMBULATORY_CARE_PROVIDER_SITE_OTHER): Payer: Managed Care, Other (non HMO)

## 2021-12-05 ENCOUNTER — Ambulatory Visit (INDEPENDENT_AMBULATORY_CARE_PROVIDER_SITE_OTHER): Payer: Managed Care, Other (non HMO) | Admitting: *Deleted

## 2021-12-05 ENCOUNTER — Other Ambulatory Visit: Payer: Self-pay

## 2021-12-05 VITALS — BP 141/88 | HR 90 | Wt 188.0 lb

## 2021-12-05 DIAGNOSIS — Z3401 Encounter for supervision of normal first pregnancy, first trimester: Secondary | ICD-10-CM | POA: Diagnosis not present

## 2021-12-05 DIAGNOSIS — Z3A09 9 weeks gestation of pregnancy: Secondary | ICD-10-CM

## 2021-12-05 DIAGNOSIS — O3680X0 Pregnancy with inconclusive fetal viability, not applicable or unspecified: Secondary | ICD-10-CM

## 2021-12-05 DIAGNOSIS — O10919 Unspecified pre-existing hypertension complicating pregnancy, unspecified trimester: Secondary | ICD-10-CM | POA: Insufficient documentation

## 2021-12-05 DIAGNOSIS — O99210 Obesity complicating pregnancy, unspecified trimester: Secondary | ICD-10-CM | POA: Insufficient documentation

## 2021-12-05 DIAGNOSIS — O0990 Supervision of high risk pregnancy, unspecified, unspecified trimester: Secondary | ICD-10-CM | POA: Insufficient documentation

## 2021-12-05 NOTE — Progress Notes (Signed)
New OB Intake  I explained I am completing New OB Intake today. We discussed her EDD of 07/06/2022 that is based on LMP of 09/29/2021. Pt is G1/P0. I reviewed her allergies, medications, Medical/Surgical/OB history, and appropriate screenings. I informed her of [facility] services. Based on history, this is a/an  pregnancy complicated by hypertension .   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   Blood Pressure Cuff   Pt has her own BP cuff. Pt informed to check weekly and when she does mychart visits.    Anatomy US Explained first scheduled [place] will be around 19 weeks.   Labs Discussed Kim Miles genetic screening with patient. Would like both Panorama and Horizon drawn at new OB visit. Routine prenatal labs needed.   Placed OB Box on problem list and updated   Patient informed that the ultrasound is considered a limited obstetric ultrasound and is not intended to be a complete ultrasound exam.  Patient also informed that the ultrasound is not being completed with the intent of assessing for fetal or placental anomalies or any pelvic abnormalities. Explained that the purpose of today's ultrasound is to assess for dating and fetal heart rate.  Patient acknowledges the purpose of the exam and the limitations of the study.      First visit review I reviewed new OB appt with pt. I explained she will have ob bloodwork with genetic screening. Explained pt will be seen by Dr Alvester Morin at first visit.   Kim Marten, RN 12/05/2021  9:10 AM

## 2021-12-17 ENCOUNTER — Encounter: Payer: Self-pay | Admitting: Family Medicine

## 2021-12-29 ENCOUNTER — Encounter: Payer: Self-pay | Admitting: Family Medicine

## 2021-12-29 ENCOUNTER — Ambulatory Visit (INDEPENDENT_AMBULATORY_CARE_PROVIDER_SITE_OTHER): Payer: Managed Care, Other (non HMO) | Admitting: Family Medicine

## 2021-12-29 ENCOUNTER — Other Ambulatory Visit: Payer: Self-pay

## 2021-12-29 ENCOUNTER — Other Ambulatory Visit (HOSPITAL_COMMUNITY)
Admission: RE | Admit: 2021-12-29 | Discharge: 2021-12-29 | Disposition: A | Discharge status: Home / Self Care | Payer: Managed Care, Other (non HMO) | Source: Ambulatory Visit | Attending: Family Medicine | Admitting: Family Medicine

## 2021-12-29 VITALS — BP 137/91 | HR 97 | Wt 188.0 lb

## 2021-12-29 DIAGNOSIS — O0990 Supervision of high risk pregnancy, unspecified, unspecified trimester: Secondary | ICD-10-CM | POA: Insufficient documentation

## 2021-12-29 DIAGNOSIS — R7303 Prediabetes: Secondary | ICD-10-CM

## 2021-12-29 DIAGNOSIS — O24419 Gestational diabetes mellitus in pregnancy, unspecified control: Secondary | ICD-10-CM

## 2021-12-29 DIAGNOSIS — O10919 Unspecified pre-existing hypertension complicating pregnancy, unspecified trimester: Secondary | ICD-10-CM

## 2021-12-29 MED ORDER — ASPIRIN EC 81 MG PO TBEC: 81.0000 mg | DELAYED_RELEASE_TABLET | Freq: Every day | ORAL | 2 refills | Status: DC

## 2021-12-29 MED ORDER — ASPIRIN EC 81 MG PO TBEC: 162.0000 mg | DELAYED_RELEASE_TABLET | Freq: Every day | ORAL | 2 refills | Status: DC

## 2021-12-29 MED ORDER — AMLODIPINE BESYLATE 5 MG PO TABS: 10.0000 mg | ORAL_TABLET | Freq: Every day | ORAL | 4 refills | Status: DC

## 2021-12-29 NOTE — Progress Notes (Signed)
INITIAL PRENATAL VISIT  Subjective:   Kim Miles is being seen today for her first obstetrical visit.  This is a planned pregnancy. This is a desired pregnancy.  She is at [redacted]w[redacted]d gestation by LMP/early [place] Her obstetrical history is significant for obesity and cHTN, prediabetes . Relationship with FOB: spouse, living together. Patient does intend to breast feed. Pregnancy history fully reviewed.  Patient reports no complaints.  Indications for ASA therapy (per uptodate) One of the following: Previous pregnancy with preeclampsia, especially early onset and with an adverse outcome No Multifetal gestation No Chronic hypertension Yes Type 1 or 2 diabetes mellitus No Chronic kidney disease No Autoimmune disease (antiphospholipid syndrome, systemic lupus erythematosus) No  Two or more of the following: Nulliparity Yes Obesity (body mass index >30 kg/m2) Yes Family history of preeclampsia in mother or sister No Age ?35 years No Sociodemographic characteristics (African American race, low socioeconomic level) No Personal risk factors (eg, previous pregnancy with low birth weight or small for gestational age infant, previous adverse pregnancy outcome [eg, stillbirth], interval >10 years between pregnancies) No  Indications for early GDM screening  First-degree relative with diabetes No BMI >30kg/m2 Yes Age > 25 Yes Previous birth of an infant weighing ?4000 g No Gestational diabetes mellitus in a previous pregnancy No Glycated hemoglobin ?5.7 percent (39 mmol/mol), impaired glucose tolerance, or impaired fasting glucose on previous testing Yes High-risk race/ethnicity (eg, African American, Latino, Native American, [place] American, Pacific Islander) No Previous stillbirth of unknown cause No Maternal birthweight > 9 lbs No History of cardiovascular disease No Hypertension or on therapy for hypertension No High-density lipoprotein cholesterol level <35 mg/dL (0.90 mmol/L) and/or a  triglyceride level >250 mg/dL (2.82 mmol/L) No Polycystic ovary syndrome No Physical inactivity No Other clinical condition associated with insulin resistance (eg, severe obesity, acanthosis nigricans) No Current use of glucocorticoids No   Early screening tests: FBS, A1C, Random CBG, glucose challenge   Review of Systems:   Review of Systems  Objective:    Obstetric History OB History  Gravida Para Term Preterm AB Living  1            SAB IAB Ectopic Multiple Live Births               # Outcome Date GA Lbr Len/2nd Weight Sex Delivery Anes PTL Lv  1 Current             Past Medical History:  Diagnosis Date   Depression    Dysmenorrhea 2001   Hypertension    Primary dysmenorrhea 03/31/2017   Treated with OCPs effectively. Hx of hospitalization at age 27yo for heavy prolonged period (anemia?).  Formatting of this note might be different from the original. Treated with OCPs effectively. Hx of hospitalization at age 43yo for heavy prolonged period (anemia?).    No past surgical history on file.  Current Outpatient Medications on File Prior to Visit  Medication Sig Dispense Refill   amLODipine (NORVASC) 5 MG tablet Take 1 tablet (5 mg total) by mouth daily. 90 tablet 3   escitalopram (LEXAPRO) 10 MG tablet Take 1 tablet (10 mg total) by mouth at bedtime. 90 tablet 3   No current facility-administered medications on file prior to visit.    No Known Allergies  Social History:  reports that she has never smoked. She has never used smokeless tobacco. She reports that she does not drink alcohol and does not use drugs.  Family History  Problem Relation Age of Onset  Asthma Mother    Hypertension Father    Anemia Sister    Eating disorder Sister    Cancer Maternal Grandmother    Cancer Maternal Grandfather    Diabetes Maternal Grandfather    Alzheimer's disease Maternal Grandfather    Heart attack Paternal Grandmother    Arthritis Paternal Grandmother     The  following portions of the patient's history were reviewed and updated as appropriate: allergies, current medications, past family history, past medical history, past social history, past surgical history and problem list.  Review of Systems Review of Systems  Constitutional:  Negative for chills and fever.  HENT:  Negative for congestion and sore throat.   Eyes:  Negative for pain and visual disturbance.  Respiratory:  Negative for cough, chest tightness and shortness of breath.   Cardiovascular:  Negative for chest pain.  Gastrointestinal:  Negative for abdominal pain, diarrhea, nausea and vomiting.  Endocrine: Negative for cold intolerance and heat intolerance.  Genitourinary:  Negative for dysuria and flank pain.  Musculoskeletal:  Negative for back pain.  Skin:  Negative for rash.  Allergic/Immunologic: Negative for food allergies.  Neurological:  Negative for dizziness and light-headedness.  Psychiatric/Behavioral:  Negative for agitation.      Physical Exam:  BP (!) 137/91    Pulse 97    Wt 188 lb (85.3 kg)    LMP 09/29/2021    BMI 37.97 kg/m  CONSTITUTIONAL: Well-developed, well-nourished female in no acute distress.  HENT:  Normocephalic, atraumatic, External right and left ear normal. Oropharynx is clear and moist EYES: Conjunctivae normal. No scleral icterus.  NECK: Normal range of motion, supple, no masses.  Normal thyroid.  SKIN: Skin is warm and dry. No rash noted. Not diaphoretic. No erythema. No pallor. MUSCULOSKELETAL: Normal range of motion. No tenderness.  No cyanosis, clubbing, or edema.   NEUROLOGIC: Alert and oriented to person, place, and time. Normal muscle tone coordination.  PSYCHIATRIC: Normal mood and affect. Normal behavior. Normal judgment and thought content. CARDIOVASCULAR: Normal heart rate noted, regular rhythm RESPIRATORY: Clear to auscultation bilaterally. Effort and breath sounds normal, no problems with respiration noted. BREASTS:  deferred ABDOMEN: Soft, normal bowel sounds, no distention noted.  No tenderness, rebound or guarding. Fundal ht: NA PELVIC: deferred FHR: 160   Assessment:    Pregnancy: G1P0000  1. Supervision of high risk pregnancy, antepartum - CBC/D/Plt+RPR+Rh+ABO+RubIgG... - Culture, OB Urine - Genetic Screening - GC/Chlamydia probe amp ([place])not at [facility] - Comprehensive metabolic panel - Protein / creatinine ratio, urine - TSH - Hemoglobin A1c - amLODipine (NORVASC) 5 MG tablet; Take 2 tablets (10 mg total) by mouth daily.  Dispense: 90 tablet; Refill: 4 - [place] MFM OB DETAIL +14 WK; Future - aspirin EC 81 MG tablet; Take 2 tablets (162 mg total) by mouth daily. Take after 12 weeks for prevention of preeclampsia later in pregnancy  Dispense: 300 tablet; Refill: 2 - Glucose Tolerance, 2 Hours w/1 Hour; Future  2. Prediabetes Discussed recommendation for GTT given known prediabetes. Agrees and will return next week, aware of need to be fasting - Glucose Tolerance, 2 Hours w/1 Hour; Future  3. Chronic hypertension affecting pregnancy - amLODipine (NORVASC) 5 MG tablet; Take 2 tablets (10 mg total) by mouth daily.  Dispense: 90 tablet; Refill: 4 - [place] MFM OB DETAIL +14 WK; Future - aspirin EC 81 MG tablet; Take 2 tablets (162 mg total) by mouth daily. Take after 12 weeks for prevention of preeclampsia later in pregnancy  Dispense: 300  tablet; Refill: 2    Plan:     Initial labs drawn. Prenatal vitamins. Problem list reviewed and updated. Reviewed in detail the nature of the practice with collaborative care between  Genetic screening discussed: NIPS requested. Role of ultrasound in pregnancy discussed; Anatomy US: requested. Amniocentesis discussed: not indicated. Follow up in 4 weeks. Discussed clinic routines, schedule of care and testing, genetic screening options, involvement of students and residents under the direct supervision of APPs and doctors and presence of female providers.  Pt verbalized understanding.  Future Appointments  Date Time Provider [place]  01/06/2022  8:35 AM [facility] LAB [facility]  01/26/2022  1:30 PM Caren Macadam, MD [facility]  02/09/2022  9:15 AM [facility] NURSE [facility]  02/09/2022  9:30 AM [facility] US2 [facility]  02/23/2022  1:30 PM Caren Macadam, MD [facility]  03/24/2022 10:35 AM Elly Modena, Vickii Chafe, MD [facility]  06/12/2022 10:20 AM Delsa Grana, PA-C [facility]     Caren Macadam, MD 12/29/2021 2:54 PM

## 2021-12-30 LAB — PROTEIN / CREATININE RATIO, URINE
Creatinine, Urine: 149.6 mg/dL
Protein, Ur: 17 mg/dL
Protein/Creat Ratio: 114 mg/g creat (ref 0–200)

## 2021-12-31 LAB — GC/CHLAMYDIA PROBE AMP (~~LOC~~) NOT AT ARMC
Chlamydia: NEGATIVE
Comment: NEGATIVE
Comment: NORMAL
Neisseria Gonorrhea: NEGATIVE

## 2021-12-31 LAB — URINE CULTURE, OB REFLEX

## 2021-12-31 LAB — CULTURE, OB URINE

## 2022-01-01 LAB — COMPREHENSIVE METABOLIC PANEL
ALT: 6 IU/L (ref 0–32)
AST: 15 IU/L (ref 0–40)
Albumin/Globulin Ratio: 1.3 (ref 1.2–2.2)
Albumin: 3.8 g/dL (ref 3.8–4.8)
Alkaline Phosphatase: 81 IU/L (ref 44–121)
BUN/Creatinine Ratio: 17 (ref 9–23)
BUN: 9 mg/dL (ref 6–20)
Bilirubin Total: <0.2 mg/dL (ref 0.0–1.2)
CO2: 20 mmol/L (ref 20–29)
Calcium: 9.4 mg/dL (ref 8.7–10.2)
Chloride: 101 mmol/L (ref 96–106)
Creatinine, Ser: 0.52 mg/dL — ABNORMAL LOW (ref 0.57–1.00)
Globulin, Total: 3 g/dL (ref 1.5–4.5)
Glucose: 80 mg/dL (ref 70–99)
Potassium: 4.3 mmol/L (ref 3.5–5.2)
Sodium: 136 mmol/L (ref 134–144)
Total Protein: 6.8 g/dL (ref 6.0–8.5)
eGFR: 127 mL/min/{1.73_m2} (ref >59)

## 2022-01-01 LAB — CBC/D/PLT+RPR+RH+ABO+RUBIGG...
Antibody Screen: NEGATIVE
Basophils Absolute: 0.1 10*3/uL (ref 0.0–0.2)
Basos: 0 %
EOS (ABSOLUTE): 0.1 10*3/uL (ref 0.0–0.4)
Eos: 1 %
HCV Ab: REACTIVE — AB
HIV Screen 4th Generation wRfx: NONREACTIVE
Hematocrit: 39.3 % (ref 34.0–46.6)
Hemoglobin: 13.2 g/dL (ref 11.1–15.9)
Hepatitis B Surface Ag: NEGATIVE
Immature Grans (Abs): 0 10*3/uL (ref 0.0–0.1)
Immature Granulocytes: 0 %
Lymphocytes Absolute: 2.4 10*3/uL (ref 0.7–3.1)
Lymphs: 19 %
MCH: 29.5 pg (ref 26.6–33.0)
MCHC: 33.6 g/dL (ref 31.5–35.7)
MCV: 88 fL (ref 79–97)
Monocytes Absolute: 0.9 10*3/uL (ref 0.1–0.9)
Monocytes: 7 %
Neutrophils Absolute: 9.3 10*3/uL — ABNORMAL HIGH (ref 1.4–7.0)
Neutrophils: 73 %
Platelets: 324 10*3/uL (ref 150–450)
RBC: 4.48 x10E6/uL (ref 3.77–5.28)
RDW: 12.9 % (ref 11.7–15.4)
RPR Ser Ql: NONREACTIVE
Rh Factor: POSITIVE
Rubella Antibodies, IGG: 1.61 index (ref >0.99)
WBC: 12.8 10*3/uL — ABNORMAL HIGH (ref 3.4–10.8)

## 2022-01-01 LAB — HCV RT-PCR, QUANT (NON-GRAPH): Hepatitis C Quantitation: NOT DETECTED IU/mL

## 2022-01-01 LAB — HEMOGLOBIN A1C
Est. average glucose Bld gHb Est-mCnc: 117 mg/dL
Hgb A1c MFr Bld: 5.7 % — ABNORMAL HIGH (ref 4.8–5.6)

## 2022-01-01 LAB — TSH: TSH: 0.507 u[IU]/mL (ref 0.450–4.500)

## 2022-01-05 ENCOUNTER — Telehealth: Payer: Self-pay | Admitting: *Deleted

## 2022-01-05 ENCOUNTER — Encounter: Payer: Self-pay | Admitting: *Deleted

## 2022-01-05 NOTE — Telephone Encounter (Signed)
Pt informed of panorama results  ?

## 2022-01-06 ENCOUNTER — Other Ambulatory Visit: Payer: Managed Care, Other (non HMO)

## 2022-01-06 ENCOUNTER — Encounter: Payer: Self-pay | Admitting: Family Medicine

## 2022-01-06 ENCOUNTER — Other Ambulatory Visit: Payer: Self-pay

## 2022-01-06 DIAGNOSIS — O0990 Supervision of high risk pregnancy, unspecified, unspecified trimester: Secondary | ICD-10-CM

## 2022-01-06 DIAGNOSIS — R768 Other specified abnormal immunological findings in serum: Secondary | ICD-10-CM

## 2022-01-06 DIAGNOSIS — R7303 Prediabetes: Secondary | ICD-10-CM

## 2022-01-06 HISTORY — DX: Other specified abnormal immunological findings in serum: R76.8

## 2022-01-07 LAB — GLUCOSE TOLERANCE, 2 HOURS W/ 1HR
Glucose, 1 hour: 212 mg/dL — ABNORMAL HIGH (ref 70–179)
Glucose, 2 hour: 104 mg/dL (ref 70–152)
Glucose, Fasting: 95 mg/dL — ABNORMAL HIGH (ref 70–91)

## 2022-01-09 ENCOUNTER — Encounter: Payer: Self-pay | Admitting: *Deleted

## 2022-01-09 DIAGNOSIS — O24419 Gestational diabetes mellitus in pregnancy, unspecified control: Secondary | ICD-10-CM | POA: Insufficient documentation

## 2022-01-09 MED ORDER — BLOOD GLUCOSE MONITOR KIT: PACK | 0 refills | Status: DC

## 2022-01-09 NOTE — Addendum Note (Signed)
Addended by: Geanie Berlin on: 01/09/2022 09:59 AM ? ? Modules accepted: Orders ? ?

## 2022-01-14 ENCOUNTER — Encounter: Payer: Managed Care, Other (non HMO) | Attending: Family Medicine | Admitting: Registered"

## 2022-01-14 ENCOUNTER — Other Ambulatory Visit: Payer: Self-pay

## 2022-01-14 DIAGNOSIS — O24419 Gestational diabetes mellitus in pregnancy, unspecified control: Secondary | ICD-10-CM | POA: Insufficient documentation

## 2022-01-19 ENCOUNTER — Encounter: Payer: Self-pay | Admitting: Registered"

## 2022-01-19 NOTE — Progress Notes (Signed)
Patient was seen on 01/14/22 for Gestational Diabetes self-management class at the [facility]. The following learning objectives were met by the patient during this course: ? ?States the definition of Gestational Diabetes ?States why dietary management is important in controlling blood glucose ?Describes the effects each nutrient has on blood glucose levels ?Demonstrates ability to create a balanced meal plan ?Demonstrates carbohydrate counting  ?States when to check blood glucose levels ?Demonstrates proper blood glucose monitoring techniques ?States the effect of stress and exercise on blood glucose levels ?States the importance of limiting caffeine and abstaining from alcohol and smoking ? ?Blood glucose monitor given: Patient has meter and brought to class for instruction ?blood glucose reading: 99 mg/dL ? ?Patient instructed to monitor glucose levels: ?FBS: 60 - <95; 1 hour: <140; 2 hour: <120 ? ?Patient received handouts: ?Nutrition Diabetes and Pregnancy, including carb counting list ? ?Patient will be seen for follow-up as needed. ?

## 2022-01-26 ENCOUNTER — Encounter: Payer: Self-pay | Admitting: Family Medicine

## 2022-01-26 ENCOUNTER — Ambulatory Visit (INDEPENDENT_AMBULATORY_CARE_PROVIDER_SITE_OTHER): Payer: Managed Care, Other (non HMO) | Admitting: Family Medicine

## 2022-01-26 VITALS — BP 138/92 | HR 93 | Wt 189.0 lb

## 2022-01-26 DIAGNOSIS — O0990 Supervision of high risk pregnancy, unspecified, unspecified trimester: Secondary | ICD-10-CM

## 2022-01-26 DIAGNOSIS — O10919 Unspecified pre-existing hypertension complicating pregnancy, unspecified trimester: Secondary | ICD-10-CM

## 2022-01-26 DIAGNOSIS — O99210 Obesity complicating pregnancy, unspecified trimester: Secondary | ICD-10-CM

## 2022-01-26 DIAGNOSIS — O24419 Gestational diabetes mellitus in pregnancy, unspecified control: Secondary | ICD-10-CM

## 2022-01-26 MED ORDER — NIFEDIPINE ER 60 MG PO TB24: 60.0000 mg | ORAL_TABLET | Freq: Every day | ORAL | 3 refills | Status: DC

## 2022-01-26 NOTE — Progress Notes (Signed)
Pt presents for ROB at 17 weeks.  ?Interested in AFP today.  ?

## 2022-01-26 NOTE — Progress Notes (Signed)
? ?  PRENATAL VISIT NOTE ? ?Subjective:  ?Kim Miles is a 31 y.o. G1P0 at [redacted]w[redacted]d being seen today for ongoing prenatal care.  She is currently monitored for the following issues for this high-risk pregnancy and has Chronic hypertension; Prediabetes; Current mild episode of major depressive disorder ([place]); Menstrual migraine without status migrainosus, not intractable; Class 2 obesity with body mass index (BMI) of 36.0 to 36.9 in adult; Supervision of high risk pregnancy, antepartum; Maternal obesity affecting pregnancy, antepartum; Chronic hypertension affecting pregnancy; Hepatitis C antibody test positive-RNA negative ; and Gestational diabetes mellitus (GDM) affecting pregnancy, antepartum on their problem list. ? ?Patient reports no complaints.  Contractions: Not present. Vag. Bleeding: None.   . Denies leaking of fluid.  ? ?The following portions of the patient's history were reviewed and updated as appropriate: allergies, current medications, past family history, past medical history, past social history, past surgical history and problem list.  ? ?Objective:  ? ?Vitals:  ? 01/26/22 1332  ?BP: (!) 138/92  ?Pulse: 93  ?Weight: 189 lb (85.7 kg)  ? ? ?Fetal Status: Fetal Heart Rate (bpm): 144 Fundal Height: 17 cm      ? ?General:  Alert, oriented and cooperative. Patient is in no acute distress.  ?Skin: Skin is warm and dry. No rash noted.   ?Cardiovascular: Normal heart rate noted  ?Respiratory: Normal respiratory effort, no problems with respiration noted  ?Abdomen: Soft, gravid, appropriate for gestational age.  Pain/Pressure: Absent     ?Pelvic: Cervical exam deferred        ?Extremities: Normal range of motion.     ?Mental Status: Normal mood and affect. Normal behavior. Normal judgment and thought content.  ? ?Assessment and Plan:  ?Pregnancy: G1P0 at [redacted]w[redacted]d ?1. Supervision of high risk pregnancy, antepartum ?Up to date ?- AFP, Serum, Open Spina Bifida ? ?2. Maternal obesity affecting pregnancy,  antepartum ?TWG= 1 lb (0.454 kg)  ? ?3. Gestational diabetes mellitus (GDM) affecting pregnancy, antepartum ?Fasting all < 95 ?2 hr PP all < 120, except once after a church bbq.  ? ?4. Chronic hypertension affecting pregnancy ?Changed to procardia today to help with  escalation ? ?Preterm labor symptoms and general obstetric precautions including but not limited to vaginal bleeding, contractions, leaking of fluid and fetal movement were reviewed in detail with the patient. ?Please refer to After Visit Summary for other counseling recommendations.  ? ?Return for Routine prenatal care, Scheduled prenatal. ? ?Future Appointments  ?Date Time Provider [place]  ?02/09/2022  9:15 AM [facility] NURSE [facility]  ?02/09/2022  9:30 AM [facility] US2 [facility]  ?02/23/2022  1:30 PM Caren Macadam, MD [facility]  ?03/24/2022 10:35 AM Constant, Vickii Chafe, MD [facility]  ? ? ?Caren Macadam, MD ?

## 2022-02-04 LAB — AFP, SERUM, OPEN SPINA BIFIDA
AFP MoM: 0.87
AFP Value: 29.7 ng/mL
Gest. Age on Collection Date: 17 weeks
Maternal Age At EDD: 33.4 yr
OSBR Risk 1 IN: 10000
Test Results:: NEGATIVE
Weight: 189 [lb_av]

## 2022-02-09 ENCOUNTER — Ambulatory Visit: Payer: Managed Care, Other (non HMO) | Attending: Family Medicine

## 2022-02-09 ENCOUNTER — Ambulatory Visit (HOSPITAL_BASED_OUTPATIENT_CLINIC_OR_DEPARTMENT_OTHER): Payer: Managed Care, Other (non HMO) | Admitting: Obstetrics

## 2022-02-09 ENCOUNTER — Other Ambulatory Visit: Payer: Self-pay | Admitting: *Deleted

## 2022-02-09 ENCOUNTER — Encounter: Payer: Self-pay | Admitting: *Deleted

## 2022-02-09 ENCOUNTER — Ambulatory Visit: Payer: Managed Care, Other (non HMO) | Admitting: *Deleted

## 2022-02-09 VITALS — BP 124/89 | HR 81

## 2022-02-09 DIAGNOSIS — O99210 Obesity complicating pregnancy, unspecified trimester: Secondary | ICD-10-CM | POA: Insufficient documentation

## 2022-02-09 DIAGNOSIS — Z3A19 19 weeks gestation of pregnancy: Secondary | ICD-10-CM | POA: Insufficient documentation

## 2022-02-09 DIAGNOSIS — O10919 Unspecified pre-existing hypertension complicating pregnancy, unspecified trimester: Secondary | ICD-10-CM

## 2022-02-09 DIAGNOSIS — O0990 Supervision of high risk pregnancy, unspecified, unspecified trimester: Secondary | ICD-10-CM | POA: Diagnosis not present

## 2022-02-09 DIAGNOSIS — O99212 Obesity complicating pregnancy, second trimester: Secondary | ICD-10-CM | POA: Diagnosis not present

## 2022-02-09 DIAGNOSIS — O24410 Gestational diabetes mellitus in pregnancy, diet controlled: Secondary | ICD-10-CM

## 2022-02-09 DIAGNOSIS — O10912 Unspecified pre-existing hypertension complicating pregnancy, second trimester: Secondary | ICD-10-CM | POA: Insufficient documentation

## 2022-02-09 DIAGNOSIS — Z362 Encounter for other antenatal screening follow-up: Secondary | ICD-10-CM

## 2022-02-09 NOTE — Progress Notes (Signed)
MFM Note ? ?Kim Miles was seen for a detailed fetal anatomy scan due to maternal obesity with a BMI of 38 and chronic hypertension treated with procardia.  She was recently diagnosed with diet-controlled gestational diabetes.  She reports that her fingerstick values have mostly been within normal limits. ? ?She had a cell free DNA test earlier in her pregnancy which indicated a low risk for trisomy 39, 73, and 13. A female fetus is predicted.  ? ?She was informed that the fetal growth and amniotic fluid level were appropriate for her gestational age.  ? ?There were no obvious fetal anomalies noted on today's ultrasound exam.  However, today's exam was limited due to the fetal position. ? ?The patient was informed that anomalies may be missed due to technical limitations. If the fetus is in a suboptimal position or maternal habitus is increased, visualization of the fetus in the maternal uterus may be impaired. ? ?The following were discussed during our consultation today: ? ?Gestational diabetes and pregnancy ? ?The implications and management of diabetes in pregnancy was discussed in detail with the patient.   ? ?She was advised to continue to monitor her fingersticks 4 times daily (fasting and 2 hours after each meal).  She was encouraged to monitor her fingerstick values as frequently as possible. ? ?She was advised that our goals for her fingerstick values are fasting values of 90-95 or less and two-hour postprandial values of 120 or less.   ? ?Should the majority of her fingerstick results be above these values, she may have to be started on metformin or insulin to help her achieve better glycemic control.  ? ?The patient was advised that getting her fingerstick values as close to these goals as possible would provide her with the most optimal obstetrical outcome. ? ?Due to chronic hypertension and gestational diabetes, we will continue to follow her with monthly growth ultrasounds. ? ?The increased risk of  polyhydramnios, fetal macrosomia, and preeclampsia associated with diabetes was also discussed.   ? ?Chronic hypertension in pregnancy ? ?The implications and management of chronic hypertension in pregnancy was discussed. The patient was advised that should her blood pressures be elevated later in pregnancy, the dosage of Procardia may need to be increased.   ? ?The increased risk of superimposed preeclampsia, an indicated preterm delivery, and possible fetal growth restriction due to chronic hypertension in pregnancy was discussed.  ? ?To decrease her risk of superimposed preeclampsia, she should continue taking a daily baby aspirin (81 mg daily) for preeclampsia prophylaxis.  ? ?Weekly fetal testing should be started at 32 weeks. ? ?She understands that should her blood pressures be elevated or if her glycemic control be poor, that delivery will be recommended at around 37 weeks.   ? ?Should her blood pressures and glucose levels be under good control, delivery will be recommended at 39 weeks.  ? ?The patient stated that all of her questions have been answered to her satisfaction.   ? ?A follow-up exam was scheduled in 4 weeks to complete the views of the fetal anatomy and to assess the fetal growth. ? ?A total of 30 minutes was spent counseling and coordinating the care for this patient.  Greater than 50% of the time was spent in direct face-to-face contact.  ?

## 2022-02-23 ENCOUNTER — Encounter: Payer: Self-pay | Admitting: Family Medicine

## 2022-02-23 ENCOUNTER — Ambulatory Visit (INDEPENDENT_AMBULATORY_CARE_PROVIDER_SITE_OTHER): Payer: Managed Care, Other (non HMO) | Admitting: Family Medicine

## 2022-02-23 VITALS — BP 133/93 | HR 92 | Wt 191.0 lb

## 2022-02-23 DIAGNOSIS — O99210 Obesity complicating pregnancy, unspecified trimester: Secondary | ICD-10-CM

## 2022-02-23 DIAGNOSIS — R768 Other specified abnormal immunological findings in serum: Secondary | ICD-10-CM

## 2022-02-23 DIAGNOSIS — O10919 Unspecified pre-existing hypertension complicating pregnancy, unspecified trimester: Secondary | ICD-10-CM

## 2022-02-23 DIAGNOSIS — O24419 Gestational diabetes mellitus in pregnancy, unspecified control: Secondary | ICD-10-CM

## 2022-02-23 DIAGNOSIS — O0990 Supervision of high risk pregnancy, unspecified, unspecified trimester: Secondary | ICD-10-CM

## 2022-02-23 NOTE — Progress Notes (Signed)
Repeated b/p 133/93 P:92 ?

## 2022-02-23 NOTE — Progress Notes (Signed)
? ?  PRENATAL VISIT NOTE ? ?Subjective:  ?Kim Miles is a 31 y.o. G1P0 at [redacted]w[redacted]d being seen today for ongoing prenatal care.  She is currently monitored for the following issues for this high-risk pregnancy and has Chronic hypertension; Prediabetes; Current mild episode of major depressive disorder ([place]); Menstrual migraine without status migrainosus, not intractable; Class 2 obesity with body mass index (BMI) of 36.0 to 36.9 in adult; Supervision of high risk pregnancy, antepartum; Maternal obesity affecting pregnancy, antepartum; Chronic hypertension affecting pregnancy; Hepatitis C antibody test positive-RNA negative ; and Gestational diabetes mellitus (GDM) affecting pregnancy, antepartum on their problem list. ? ?Patient reports no complaints.  Contractions: Not present. Vag. Bleeding: None.  Movement: Present. Denies leaking of fluid.  ? ?The following portions of the patient's history were reviewed and updated as appropriate: allergies, current medications, past family history, past medical history, past social history, past surgical history and problem list.  ? ?Objective:  ? ?Vitals:  ? 02/23/22 1346  ?BP: (!) 141/99  ?Pulse: 90  ?Weight: 191 lb (86.6 kg)  ? ? ?Fetal Status: Fetal Heart Rate (bpm): 152   Movement: Present    ? ?General:  Alert, oriented and cooperative. Patient is in no acute distress.  ?Skin: Skin is warm and dry. No rash noted.   ?Cardiovascular: Normal heart rate noted  ?Respiratory: Normal respiratory effort, no problems with respiration noted  ?Abdomen: Soft, gravid, appropriate for gestational age.  Pain/Pressure: Absent     ?Pelvic: Cervical exam deferred        ?Extremities: Normal range of motion.  Edema: None  ?Mental Status: Normal mood and affect. Normal behavior. Normal judgment and thought content.  ? ?Assessment and Plan:  ?Pregnancy: G1P0 at [redacted]w[redacted]d ?1. Supervision of high risk pregnancy, antepartum ?Up to date ?Has [place] upcoming ?Anatomy scan WNL ? ?2. Maternal obesity  affecting pregnancy, antepartum ?TWG=3 lb (1.361 kg) which is at goal. Applauded her efforts! ? ?3. Hepatitis C antibody test positive-RNA negative  ? ?4. Gestational diabetes mellitus (GDM) affecting pregnancy, antepartum ?Fastings all under 95 ?2hr PP Only 1 values every 2 weeks that is above 120/140. She is very adherent to diet changes.  ?Has Growth [place] scheduled for GDM and HTN ? ?5. Chronic hypertension affecting pregnancy ?Elevated at initial BP, reduced after resting.  ?DBP is only slighty above 90. Will continue procardia at current dose ?Continue ASA ? ?Preterm labor symptoms and general obstetric precautions including but not limited to vaginal bleeding, contractions, leaking of fluid and fetal movement were reviewed in detail with the patient. ?Please refer to After Visit Summary for other counseling recommendations.  ? ?Return in about 4 weeks (around 03/23/2022) for Routine prenatal care. ? ?Future Appointments  ?Date Time Provider [place]  ?03/11/2022 10:45 AM [facility] NURSE [facility]  ?03/11/2022 11:00 AM [facility] US1 [facility]  ?03/24/2022 10:35 AM Constant, Vickii Chafe, MD [facility]  ? ? ?Caren Macadam, MD ?

## 2022-03-11 ENCOUNTER — Encounter: Payer: Self-pay | Admitting: *Deleted

## 2022-03-11 ENCOUNTER — Ambulatory Visit: Payer: Managed Care, Other (non HMO) | Admitting: *Deleted

## 2022-03-11 ENCOUNTER — Other Ambulatory Visit: Payer: Self-pay | Admitting: *Deleted

## 2022-03-11 ENCOUNTER — Ambulatory Visit: Payer: Managed Care, Other (non HMO) | Attending: Obstetrics

## 2022-03-11 VITALS — BP 127/87 | HR 85

## 2022-03-11 DIAGNOSIS — O99210 Obesity complicating pregnancy, unspecified trimester: Secondary | ICD-10-CM | POA: Insufficient documentation

## 2022-03-11 DIAGNOSIS — O10012 Pre-existing essential hypertension complicating pregnancy, second trimester: Secondary | ICD-10-CM | POA: Diagnosis not present

## 2022-03-11 DIAGNOSIS — O0990 Supervision of high risk pregnancy, unspecified, unspecified trimester: Secondary | ICD-10-CM | POA: Diagnosis present

## 2022-03-11 DIAGNOSIS — O10919 Unspecified pre-existing hypertension complicating pregnancy, unspecified trimester: Secondary | ICD-10-CM | POA: Diagnosis present

## 2022-03-11 DIAGNOSIS — O24410 Gestational diabetes mellitus in pregnancy, diet controlled: Secondary | ICD-10-CM | POA: Insufficient documentation

## 2022-03-11 DIAGNOSIS — O99212 Obesity complicating pregnancy, second trimester: Secondary | ICD-10-CM

## 2022-03-11 DIAGNOSIS — Z3A23 23 weeks gestation of pregnancy: Secondary | ICD-10-CM

## 2022-03-11 DIAGNOSIS — Z362 Encounter for other antenatal screening follow-up: Secondary | ICD-10-CM | POA: Diagnosis not present

## 2022-03-11 DIAGNOSIS — E669 Obesity, unspecified: Secondary | ICD-10-CM

## 2022-03-11 DIAGNOSIS — O10912 Unspecified pre-existing hypertension complicating pregnancy, second trimester: Secondary | ICD-10-CM

## 2022-03-24 ENCOUNTER — Ambulatory Visit (INDEPENDENT_AMBULATORY_CARE_PROVIDER_SITE_OTHER): Payer: Managed Care, Other (non HMO) | Admitting: Obstetrics and Gynecology

## 2022-03-24 ENCOUNTER — Encounter: Payer: Self-pay | Admitting: Obstetrics and Gynecology

## 2022-03-24 VITALS — BP 134/92 | HR 105 | Wt 189.0 lb

## 2022-03-24 DIAGNOSIS — O24419 Gestational diabetes mellitus in pregnancy, unspecified control: Secondary | ICD-10-CM

## 2022-03-24 DIAGNOSIS — O0990 Supervision of high risk pregnancy, unspecified, unspecified trimester: Secondary | ICD-10-CM

## 2022-03-24 DIAGNOSIS — O10919 Unspecified pre-existing hypertension complicating pregnancy, unspecified trimester: Secondary | ICD-10-CM

## 2022-03-24 DIAGNOSIS — O99210 Obesity complicating pregnancy, unspecified trimester: Secondary | ICD-10-CM

## 2022-03-24 NOTE — Progress Notes (Signed)
   PRENATAL VISIT NOTE  Subjective:  Kim Miles is a 31 y.o. G1P0 at [redacted]w[redacted]d being seen today for ongoing prenatal care.  She is currently monitored for the following issues for this high-risk pregnancy and has Chronic hypertension; Prediabetes; Current mild episode of major depressive disorder ([place]); Menstrual migraine without status migrainosus, not intractable; Class 2 obesity with body mass index (BMI) of 36.0 to 36.9 in adult; Supervision of high risk pregnancy, antepartum; Maternal obesity affecting pregnancy, antepartum; Chronic hypertension affecting pregnancy; Hepatitis C antibody test positive-RNA negative ; and Gestational diabetes mellitus (GDM) affecting pregnancy, antepartum on their problem list.  Patient reports no complaints.   .  .   . Denies leaking of fluid.   The following portions of the patient's history were reviewed and updated as appropriate: allergies, current medications, past family history, past medical history, past social history, past surgical history and problem list.   Objective:  There were no vitals filed for this visit.  Fetal Status:           General:  Alert, oriented and cooperative. Patient is in no acute distress.  Skin: Skin is warm and dry. No rash noted.   Cardiovascular: Normal heart rate noted  Respiratory: Normal respiratory effort, no problems with respiration noted  Abdomen: Soft, gravid, appropriate for gestational age.        Pelvic: Cervical exam deferred        Extremities: Normal range of motion.     Mental Status: Normal mood and affect. Normal behavior. Normal judgment and thought content.   Assessment and Plan:  Pregnancy: G1P0 at [redacted]w[redacted]d 1. Supervision of high risk pregnancy, antepartum Patient is doing well without complaints Third trimester labs and tdap next visit  2. Gestational diabetes mellitus (GDM) affecting pregnancy, antepartum Patient did not bring log but reports highest fasting 99 (mostly in high 80's low  90"s) and highest pp 140 (mostly in the 110's) Continue diet control Patient with 2 lb weight loss since her last visit- ensure that she is not overly restricting food intake. Patient has been making healthier substitutions  3. Chronic hypertension affecting pregnancy Continue procardia and ASA  4. Maternal obesity affecting pregnancy, antepartum   Preterm labor symptoms and general obstetric precautions including but not limited to vaginal bleeding, contractions, leaking of fluid and fetal movement were reviewed in detail with the patient. Please refer to After Visit Summary for other counseling recommendations.   Return in about 4 weeks (around 04/21/2022) for in person, ROB, High risk.  Future Appointments  Date Time Provider [place]  03/24/2022 10:35 AM [place] Gaglio, Vickii Chafe, MD [facility]  04/15/2022  9:30 AM [facility] NURSE [facility]  04/15/2022  9:45 AM [facility] US5 [facility] [place]    Mora Bellman, MD

## 2022-03-31 ENCOUNTER — Encounter: Payer: Self-pay | Admitting: Family Medicine

## 2022-04-08 ENCOUNTER — Encounter: Payer: Self-pay | Admitting: Family Medicine

## 2022-04-09 ENCOUNTER — Ambulatory Visit (INDEPENDENT_AMBULATORY_CARE_PROVIDER_SITE_OTHER): Payer: Managed Care, Other (non HMO) | Admitting: Advanced Practice Midwife

## 2022-04-09 ENCOUNTER — Encounter: Payer: Self-pay | Admitting: Family Medicine

## 2022-04-09 VITALS — BP 137/97 | HR 82 | Wt 196.0 lb

## 2022-04-09 DIAGNOSIS — O0990 Supervision of high risk pregnancy, unspecified, unspecified trimester: Secondary | ICD-10-CM

## 2022-04-09 DIAGNOSIS — O10919 Unspecified pre-existing hypertension complicating pregnancy, unspecified trimester: Secondary | ICD-10-CM

## 2022-04-09 NOTE — Progress Notes (Deleted)
Subjective:  Kim Miles is a 31 y.o. female here for BP check .  Hypertension ROS: {htn [facility] ros:315727::"taking medications as instructed","no medication side effects noted","no TIA's","no chest pain on exertion","no dyspnea on exertion","no swelling of ankles"}.   Pt reports incision {Incision status:30692}  Objective:  BP (!) 137/97   Pulse 82   Wt 196 lb (88.9 kg)   LMP 09/29/2021 (Exact Date)   BMI 39.59 kg/m   Appearance {appearance:315021::"alert, well appearing, and in no distress"}. Incision {Exam; incision:13523}   Assessment:   Blood Pressure today in office {disease control degree:315147}.  Incision {Laceration healing:11475}  Plan:  {disease follow up plans:315730}.

## 2022-04-09 NOTE — Progress Notes (Signed)
   BP CHECK  Subjective:  Kim Miles is a 31 y.o. female here for BP check. Her pregnancy is complicated by Chronic Hypertension, managed with daily Procardia XL 30 mg. Patient endorses systolic BP readings of 150s-160s on her home cuff. She denies headache, visual disturbances, RUQ/epigastric pain, new onset swelling or weight gain. She denies contractions, vaginal bleeding, leaking of fluid, decreased fetal movement, fever, falls, or recent illness.   Hypertension ROS: taking medications as instructed, no medication side effects noted, no TIA's, no chest pain on exertion, no dyspnea on exertion, and no swelling of ankles.   Objective:  BP (!) 137/97   Pulse 82   Wt 196 lb (88.9 kg)   LMP 09/29/2021 (Exact Date)   BMI 39.59 kg/m   Appearance alert, well appearing, and in no distress.   Assessment:   Blood Pressure today in office stable and consistent with previous visits on Proacrida XL 30mg    Plan:  Severe signs and symptoms reviewed with patient Non-stat PEC labs collected  , MSA, MSN, CNM Certified Nurse Midwife, [employer], clinical, [facility]

## 2022-04-10 LAB — COMPREHENSIVE METABOLIC PANEL
ALT: 7 IU/L (ref 0–32)
AST: 13 IU/L (ref 0–40)
Albumin/Globulin Ratio: 1.2 (ref 1.2–2.2)
Albumin: 3.6 g/dL — ABNORMAL LOW (ref 3.8–4.8)
Alkaline Phosphatase: 101 IU/L (ref 44–121)
BUN/Creatinine Ratio: 15 (ref 9–23)
BUN: 8 mg/dL (ref 6–20)
Bilirubin Total: <0.2 mg/dL (ref 0.0–1.2)
CO2: 19 mmol/L — ABNORMAL LOW (ref 20–29)
Calcium: 9 mg/dL (ref 8.7–10.2)
Chloride: 101 mmol/L (ref 96–106)
Creatinine, Ser: 0.53 mg/dL — ABNORMAL LOW (ref 0.57–1.00)
Globulin, Total: 3 g/dL (ref 1.5–4.5)
Glucose: 91 mg/dL (ref 70–99)
Potassium: 4.6 mmol/L (ref 3.5–5.2)
Sodium: 135 mmol/L (ref 134–144)
Total Protein: 6.6 g/dL (ref 6.0–8.5)
eGFR: 125 mL/min/{1.73_m2} (ref >59)

## 2022-04-10 LAB — CBC
Hematocrit: 37.2 % (ref 34.0–46.6)
Hemoglobin: 12.7 g/dL (ref 11.1–15.9)
MCH: 30.2 pg (ref 26.6–33.0)
MCHC: 34.1 g/dL (ref 31.5–35.7)
MCV: 89 fL (ref 79–97)
Platelets: 331 10*3/uL (ref 150–450)
RBC: 4.2 x10E6/uL (ref 3.77–5.28)
RDW: 12.6 % (ref 11.7–15.4)
WBC: 14.5 10*3/uL — ABNORMAL HIGH (ref 3.4–10.8)

## 2022-04-10 LAB — PROTEIN / CREATININE RATIO, URINE
Creatinine, Urine: 73.2 mg/dL
Protein, Ur: 10 mg/dL
Protein/Creat Ratio: 137 mg/g creat (ref 0–200)

## 2022-04-15 ENCOUNTER — Encounter: Payer: Self-pay | Admitting: *Deleted

## 2022-04-15 ENCOUNTER — Other Ambulatory Visit: Payer: Self-pay | Admitting: *Deleted

## 2022-04-15 ENCOUNTER — Ambulatory Visit: Payer: Managed Care, Other (non HMO) | Attending: Maternal & Fetal Medicine

## 2022-04-15 ENCOUNTER — Ambulatory Visit: Payer: Managed Care, Other (non HMO) | Admitting: *Deleted

## 2022-04-15 VITALS — BP 142/100 | HR 82

## 2022-04-15 VITALS — BP 151/93

## 2022-04-15 DIAGNOSIS — O10013 Pre-existing essential hypertension complicating pregnancy, third trimester: Secondary | ICD-10-CM

## 2022-04-15 DIAGNOSIS — O99212 Obesity complicating pregnancy, second trimester: Secondary | ICD-10-CM | POA: Insufficient documentation

## 2022-04-15 DIAGNOSIS — O0990 Supervision of high risk pregnancy, unspecified, unspecified trimester: Secondary | ICD-10-CM | POA: Diagnosis present

## 2022-04-15 DIAGNOSIS — O10919 Unspecified pre-existing hypertension complicating pregnancy, unspecified trimester: Secondary | ICD-10-CM | POA: Insufficient documentation

## 2022-04-15 DIAGNOSIS — E669 Obesity, unspecified: Secondary | ICD-10-CM

## 2022-04-15 DIAGNOSIS — Z3A28 28 weeks gestation of pregnancy: Secondary | ICD-10-CM

## 2022-04-15 DIAGNOSIS — O10912 Unspecified pre-existing hypertension complicating pregnancy, second trimester: Secondary | ICD-10-CM | POA: Diagnosis present

## 2022-04-15 DIAGNOSIS — O24410 Gestational diabetes mellitus in pregnancy, diet controlled: Secondary | ICD-10-CM

## 2022-04-15 DIAGNOSIS — O99210 Obesity complicating pregnancy, unspecified trimester: Secondary | ICD-10-CM | POA: Diagnosis present

## 2022-04-15 DIAGNOSIS — Z6838 Body mass index (BMI) 38.0-38.9, adult: Secondary | ICD-10-CM

## 2022-04-15 DIAGNOSIS — O24113 Pre-existing diabetes mellitus, type 2, in pregnancy, third trimester: Secondary | ICD-10-CM

## 2022-04-25 DIAGNOSIS — O1413 Severe pre-eclampsia, third trimester: Secondary | ICD-10-CM

## 2022-04-25 HISTORY — DX: Severe pre-eclampsia, third trimester: O14.13

## 2022-04-30 ENCOUNTER — Encounter: Payer: Self-pay | Admitting: Obstetrics and Gynecology

## 2022-04-30 ENCOUNTER — Ambulatory Visit (INDEPENDENT_AMBULATORY_CARE_PROVIDER_SITE_OTHER): Payer: Managed Care, Other (non HMO) | Admitting: Obstetrics and Gynecology

## 2022-04-30 VITALS — BP 144/94 | HR 95 | Wt 197.8 lb

## 2022-04-30 DIAGNOSIS — E669 Obesity, unspecified: Secondary | ICD-10-CM

## 2022-04-30 DIAGNOSIS — O0993 Supervision of high risk pregnancy, unspecified, third trimester: Secondary | ICD-10-CM

## 2022-04-30 DIAGNOSIS — Z0374 Encounter for suspected problem with fetal growth ruled out: Secondary | ICD-10-CM | POA: Insufficient documentation

## 2022-04-30 DIAGNOSIS — O99210 Obesity complicating pregnancy, unspecified trimester: Secondary | ICD-10-CM

## 2022-04-30 DIAGNOSIS — Z3A30 30 weeks gestation of pregnancy: Secondary | ICD-10-CM

## 2022-04-30 DIAGNOSIS — O365930 Maternal care for other known or suspected poor fetal growth, third trimester, not applicable or unspecified: Secondary | ICD-10-CM | POA: Insufficient documentation

## 2022-04-30 DIAGNOSIS — Z6836 Body mass index (BMI) 36.0-36.9, adult: Secondary | ICD-10-CM

## 2022-04-30 DIAGNOSIS — Z23 Encounter for immunization: Secondary | ICD-10-CM | POA: Diagnosis not present

## 2022-04-30 DIAGNOSIS — O10919 Unspecified pre-existing hypertension complicating pregnancy, unspecified trimester: Secondary | ICD-10-CM

## 2022-04-30 DIAGNOSIS — O0990 Supervision of high risk pregnancy, unspecified, unspecified trimester: Secondary | ICD-10-CM

## 2022-04-30 MED ORDER — NIFEDIPINE ER OSMOTIC RELEASE 30 MG PO TB24: 30.0000 mg | ORAL_TABLET | Freq: Every day | ORAL | 2 refills | Status: DC

## 2022-04-30 MED ORDER — NIFEDIPINE ER 60 MG PO TB24: 60.0000 mg | ORAL_TABLET | Freq: Every day | ORAL | 3 refills | Status: DC

## 2022-04-30 NOTE — Progress Notes (Signed)
    PRENATAL VISIT NOTE  Subjective:  Kim Miles is a 31 y.o. G1P0 at [redacted]w[redacted]d being seen today for ongoing prenatal care.  She is currently monitored for the following issues for this high-risk pregnancy and has Prediabetes; Current mild episode of major depressive disorder (HCC); Menstrual migraine without status migrainosus, not intractable; Class 2 obesity with body mass index (BMI) of 36.0 to 36.9 in adult; Supervision of high risk pregnancy, antepartum; Maternal obesity affecting pregnancy, antepartum; Chronic hypertension affecting pregnancy; Gestational diabetes mellitus (GDM) affecting pregnancy, antepartum; and Fetal growth problem suspected but not found on their problem list.  Patient reports no complaints.  Contractions: Not present. Vag. Bleeding: None.  Movement: Present. Denies leaking of fluid.   The following portions of the patient's history were reviewed and updated as appropriate: allergies, current medications, past family history, past medical history, past social history, past surgical history and problem list.   Objective:   Vitals:   04/30/22 1113 04/30/22 1123  BP: (!) 155/104 (!) 144/94  Pulse: 95   Weight: 197 lb 12.8 oz (89.7 kg)     Fetal Status: Fetal Heart Rate (bpm): 150   Movement: Present     General:  Alert, oriented and cooperative. Patient is in no acute distress.  Skin: Skin is warm and dry. No rash noted.   Cardiovascular: Normal heart rate noted  Respiratory: Normal respiratory effort, no problems with respiration noted  Abdomen: Soft, gravid, appropriate for gestational age.  Pain/Pressure: Absent     Pelvic: Cervical exam deferred        Extremities: Normal range of motion.  Edema: Moderate pitting, indentation subsides rapidly  Mental Status: Normal mood and affect. Normal behavior. Normal judgment and thought content.   Assessment and Plan:  Pregnancy: G1P0 at [redacted]w[redacted]d 1. Supervision of high risk pregnancy, antepartum Ask about birth  contrl next visit  2. Fetal growth problem suspected but not found Borderline fgr. Getting serial scans and ap testing starting at 32wks  3. [redacted] weeks gestation of pregnancy  4. Chronic hypertension affecting pregnancy Pt on procardia xl 60 qhs. Her manual repeat was 144/94 which is what she gets at home. Recommend increasing procardia to add 30mg  in the morning. F/u BP at [facility] u/s next week. I d/w her that likely 37-38wks for delivery timing  5. Maternal obesity affecting pregnancy, antepartum 9lbs TWG  6. GDMa1 Normal 2 hour post prandials but some AM fastings in the high 90s but may not be true fastings. Continue to follow and d/w her may need to add qhs medicine  Preterm labor symptoms and general obstetric precautions including but not limited to vaginal bleeding, contractions, leaking of fluid and fetal movement were reviewed in detail with the patient. Please refer to After Visit Summary for other counseling recommendations.   Return in about 2 weeks (around 05/14/2022) for in person, high risk ob, md visit.  Future Appointments  Date Time Provider Department Center  05/05/2022  3:30 PM [facility]  05/05/2022  3:45 PM [facility] US4 [facility]  05/13/2022  8:45 AM [facility] NURSE [facility]  05/13/2022  9:00 AM [facility] US1 [facility]  05/14/2022 11:15 AM 05/16/2022, MD [facility]  05/20/2022  8:30 AM [facility]URSE [facility]  05/20/2022  8:45 AM [facility] US4 [facility]  05/27/2022  8:30 AM [facility]URSE [facility]  05/27/2022  8:45 AM [facility] US4 [facility]    07/27/2022, MD

## 2022-05-05 ENCOUNTER — Ambulatory Visit (HOSPITAL_BASED_OUTPATIENT_CLINIC_OR_DEPARTMENT_OTHER): Payer: Managed Care, Other (non HMO)

## 2022-05-05 ENCOUNTER — Other Ambulatory Visit: Payer: Self-pay | Admitting: Obstetrics

## 2022-05-05 ENCOUNTER — Ambulatory Visit: Payer: Managed Care, Other (non HMO) | Admitting: Maternal & Fetal Medicine

## 2022-05-05 ENCOUNTER — Ambulatory Visit: Payer: Managed Care, Other (non HMO)

## 2022-05-05 ENCOUNTER — Other Ambulatory Visit: Payer: Self-pay

## 2022-05-05 ENCOUNTER — Encounter (HOSPITAL_COMMUNITY): Payer: Self-pay | Admitting: Obstetrics & Gynecology

## 2022-05-05 ENCOUNTER — Inpatient Hospital Stay (HOSPITAL_COMMUNITY)
Admission: AD | Admit: 2022-05-05 | Discharge: 2022-05-21 | DRG: 788 | Disposition: A | Discharge status: Home / Self Care | Payer: Managed Care, Other (non HMO) | Attending: Obstetrics and Gynecology | Admitting: Obstetrics and Gynecology

## 2022-05-05 VITALS — BP 153/111 | HR 75

## 2022-05-05 DIAGNOSIS — O1413 Severe pre-eclampsia, third trimester: Secondary | ICD-10-CM | POA: Diagnosis not present

## 2022-05-05 DIAGNOSIS — Z3A31 31 weeks gestation of pregnancy: Secondary | ICD-10-CM | POA: Diagnosis not present

## 2022-05-05 DIAGNOSIS — O1002 Pre-existing essential hypertension complicating childbirth: Secondary | ICD-10-CM | POA: Diagnosis present

## 2022-05-05 DIAGNOSIS — Z6838 Body mass index (BMI) 38.0-38.9, adult: Secondary | ICD-10-CM | POA: Insufficient documentation

## 2022-05-05 DIAGNOSIS — O24113 Pre-existing diabetes mellitus, type 2, in pregnancy, third trimester: Secondary | ICD-10-CM

## 2022-05-05 DIAGNOSIS — O0990 Supervision of high risk pregnancy, unspecified, unspecified trimester: Secondary | ICD-10-CM | POA: Insufficient documentation

## 2022-05-05 DIAGNOSIS — O10919 Unspecified pre-existing hypertension complicating pregnancy, unspecified trimester: Secondary | ICD-10-CM

## 2022-05-05 DIAGNOSIS — O24414 Gestational diabetes mellitus in pregnancy, insulin controlled: Secondary | ICD-10-CM | POA: Diagnosis not present

## 2022-05-05 DIAGNOSIS — O24424 Gestational diabetes mellitus in childbirth, insulin controlled: Secondary | ICD-10-CM | POA: Diagnosis not present

## 2022-05-05 DIAGNOSIS — O9902 Anemia complicating childbirth: Secondary | ICD-10-CM | POA: Diagnosis present

## 2022-05-05 DIAGNOSIS — F329 Major depressive disorder, single episode, unspecified: Secondary | ICD-10-CM | POA: Diagnosis present

## 2022-05-05 DIAGNOSIS — O99344 Other mental disorders complicating childbirth: Secondary | ICD-10-CM | POA: Diagnosis present

## 2022-05-05 DIAGNOSIS — O99210 Obesity complicating pregnancy, unspecified trimester: Secondary | ICD-10-CM | POA: Insufficient documentation

## 2022-05-05 DIAGNOSIS — Z3A32 32 weeks gestation of pregnancy: Secondary | ICD-10-CM | POA: Diagnosis not present

## 2022-05-05 DIAGNOSIS — E669 Obesity, unspecified: Secondary | ICD-10-CM | POA: Diagnosis not present

## 2022-05-05 DIAGNOSIS — O99213 Obesity complicating pregnancy, third trimester: Secondary | ICD-10-CM | POA: Diagnosis not present

## 2022-05-05 DIAGNOSIS — O99214 Obesity complicating childbirth: Secondary | ICD-10-CM | POA: Diagnosis present

## 2022-05-05 DIAGNOSIS — Z3A33 33 weeks gestation of pregnancy: Secondary | ICD-10-CM | POA: Diagnosis not present

## 2022-05-05 DIAGNOSIS — O1414 Severe pre-eclampsia complicating childbirth: Secondary | ICD-10-CM | POA: Diagnosis not present

## 2022-05-05 DIAGNOSIS — O99892 Other specified diseases and conditions complicating childbirth: Secondary | ICD-10-CM | POA: Diagnosis not present

## 2022-05-05 DIAGNOSIS — O365930 Maternal care for other known or suspected poor fetal growth, third trimester, not applicable or unspecified: Secondary | ICD-10-CM | POA: Diagnosis present

## 2022-05-05 DIAGNOSIS — Z8759 Personal history of other complications of pregnancy, childbirth and the puerperium: Secondary | ICD-10-CM | POA: Diagnosis present

## 2022-05-05 DIAGNOSIS — O10013 Pre-existing essential hypertension complicating pregnancy, third trimester: Secondary | ICD-10-CM | POA: Diagnosis not present

## 2022-05-05 DIAGNOSIS — O114 Pre-existing hypertension with pre-eclampsia, complicating childbirth: Secondary | ICD-10-CM | POA: Diagnosis present

## 2022-05-05 DIAGNOSIS — O24419 Gestational diabetes mellitus in pregnancy, unspecified control: Secondary | ICD-10-CM | POA: Diagnosis present

## 2022-05-05 DIAGNOSIS — O24420 Gestational diabetes mellitus in childbirth, diet controlled: Secondary | ICD-10-CM | POA: Diagnosis present

## 2022-05-05 DIAGNOSIS — O24439 Gestational diabetes mellitus in the puerperium, unspecified control: Secondary | ICD-10-CM | POA: Diagnosis not present

## 2022-05-05 DIAGNOSIS — O119 Pre-existing hypertension with pre-eclampsia, unspecified trimester: Secondary | ICD-10-CM | POA: Insufficient documentation

## 2022-05-05 DIAGNOSIS — O24313 Unspecified pre-existing diabetes mellitus in pregnancy, third trimester: Secondary | ICD-10-CM | POA: Diagnosis not present

## 2022-05-05 DIAGNOSIS — O113 Pre-existing hypertension with pre-eclampsia, third trimester: Secondary | ICD-10-CM

## 2022-05-05 DIAGNOSIS — Z98891 History of uterine scar from previous surgery: Secondary | ICD-10-CM

## 2022-05-05 DIAGNOSIS — R040 Epistaxis: Secondary | ICD-10-CM | POA: Diagnosis not present

## 2022-05-05 DIAGNOSIS — O358XX0 Maternal care for other (suspected) fetal abnormality and damage, not applicable or unspecified: Secondary | ICD-10-CM | POA: Diagnosis present

## 2022-05-05 HISTORY — DX: Personal history of other complications of pregnancy, childbirth and the puerperium: Z87.59

## 2022-05-05 LAB — COMPREHENSIVE METABOLIC PANEL
ALT: 13 U/L (ref 0–44)
AST: 23 U/L (ref 15–41)
Albumin: 2.6 g/dL — ABNORMAL LOW (ref 3.5–5.0)
Alkaline Phosphatase: 110 U/L (ref 38–126)
Anion gap: 9 (ref 5–15)
BUN: 10 mg/dL (ref 6–20)
CO2: 19 mmol/L — ABNORMAL LOW (ref 22–32)
Calcium: 8.1 mg/dL — ABNORMAL LOW (ref 8.9–10.3)
Chloride: 102 mmol/L (ref 98–111)
Creatinine, Ser: 0.71 mg/dL (ref 0.44–1.00)
GFR, Estimated: >60 mL/min (ref >60)
Glucose, Bld: 213 mg/dL — ABNORMAL HIGH (ref 70–99)
Potassium: 4.5 mmol/L (ref 3.5–5.1)
Sodium: 130 mmol/L — ABNORMAL LOW (ref 135–145)
Total Bilirubin: 0.3 mg/dL (ref 0.3–1.2)
Total Protein: 6.7 g/dL (ref 6.5–8.1)

## 2022-05-05 LAB — TYPE AND SCREEN
ABO/RH(D): A POS
Antibody Screen: NEGATIVE

## 2022-05-05 LAB — CBC
HCT: 36.1 % (ref 36.0–46.0)
Hemoglobin: 12.4 g/dL (ref 12.0–15.0)
MCH: 30.2 pg (ref 26.0–34.0)
MCHC: 34.3 g/dL (ref 30.0–36.0)
MCV: 87.8 fL (ref 80.0–100.0)
Platelets: 330 10*3/uL (ref 150–400)
RBC: 4.11 MIL/uL (ref 3.87–5.11)
RDW: 13.1 % (ref 11.5–15.5)
WBC: 18.5 10*3/uL — ABNORMAL HIGH (ref 4.0–10.5)
nRBC: 0 % (ref 0.0–0.2)

## 2022-05-05 LAB — HEMOGLOBIN A1C
Hgb A1c MFr Bld: 5.8 % — ABNORMAL HIGH (ref 4.8–5.6)
Mean Plasma Glucose: 119.76 mg/dL

## 2022-05-05 LAB — PROTEIN / CREATININE RATIO, URINE
Creatinine, Urine: 25 mg/dL
Protein Creatinine Ratio: 0.36 mg/mg{Cre} — ABNORMAL HIGH (ref 0.00–0.15)
Total Protein, Urine: 9 mg/dL

## 2022-05-05 MED ORDER — LABETALOL HCL 5 MG/ML IV SOLN
20.0000 mg | INTRAVENOUS | Status: DC | PRN
  Administered 2022-05-06 – 2022-05-20 (×2): 20 mg via INTRAVENOUS
  Filled 2022-05-05 (×2): qty 4

## 2022-05-05 MED ORDER — ESCITALOPRAM OXALATE 10 MG PO TABS
10.0000 mg | ORAL_TABLET | Freq: Every day | ORAL | Status: DC
  Administered 2022-05-05 – 2022-05-20 (×16): 10 mg via ORAL
  Filled 2022-05-05 (×16): qty 1

## 2022-05-05 MED ORDER — ASPIRIN 81 MG PO TBEC
162.0000 mg | DELAYED_RELEASE_TABLET | Freq: Every day | ORAL | Status: DC
Start: 2022-05-06 — End: 2022-05-18
  Administered 2022-05-06 – 2022-05-17 (×10): 162 mg via ORAL
  Filled 2022-05-05 (×11): qty 2

## 2022-05-05 MED ORDER — HYDRALAZINE HCL 20 MG/ML IJ SOLN: 10.0000 mg | INTRAMUSCULAR | Status: DC | PRN

## 2022-05-05 MED ORDER — INSULIN ASPART 100 UNIT/ML IJ SOLN
0.0000 [IU] | Freq: Three times a day (TID) | INTRAMUSCULAR | Status: DC
  Administered 2022-05-06 (×2): 2 [IU] via SUBCUTANEOUS
  Administered 2022-05-06: 4 [IU] via SUBCUTANEOUS
  Administered 2022-05-06: 2 [IU] via SUBCUTANEOUS
  Administered 2022-05-07 (×3): 3 [IU] via SUBCUTANEOUS
  Administered 2022-05-08: 2 [IU] via SUBCUTANEOUS
  Administered 2022-05-08 – 2022-05-09 (×2): 1 [IU] via SUBCUTANEOUS
  Administered 2022-05-10 – 2022-05-17 (×5): 2 [IU] via SUBCUTANEOUS

## 2022-05-05 MED ORDER — MAGNESIUM SULFATE 40 GM/1000ML IV SOLN
2.0000 g/h | INTRAVENOUS | Status: DC
  Administered 2022-05-06: 2 g/h via INTRAVENOUS
  Filled 2022-05-05 (×2): qty 1000

## 2022-05-05 MED ORDER — ACETAMINOPHEN 325 MG PO TABS
650.0000 mg | ORAL_TABLET | ORAL | Status: DC | PRN
  Administered 2022-05-06 – 2022-05-20 (×4): 650 mg via ORAL
  Filled 2022-05-05 (×4): qty 2

## 2022-05-05 MED ORDER — ZOLPIDEM TARTRATE 5 MG PO TABS: 5.0000 mg | ORAL_TABLET | Freq: Every evening | ORAL | Status: DC | PRN

## 2022-05-05 MED ORDER — BETAMETHASONE SOD PHOS & ACET 6 (3-3) MG/ML IJ SUSP
12.0000 mg | INTRAMUSCULAR | Status: AC
  Administered 2022-05-05 – 2022-05-06 (×2): 12 mg via INTRAMUSCULAR
  Filled 2022-05-05: qty 5

## 2022-05-05 MED ORDER — CALCIUM CARBONATE ANTACID 500 MG PO CHEW
2.0000 | CHEWABLE_TABLET | ORAL | Status: DC | PRN
  Administered 2022-05-07: 400 mg via ORAL
  Filled 2022-05-05: qty 2

## 2022-05-05 MED ORDER — LABETALOL HCL 5 MG/ML IV SOLN: 40.0000 mg | INTRAVENOUS | Status: DC | PRN

## 2022-05-05 MED ORDER — PRENATAL MULTIVITAMIN CH
1.0000 | ORAL_TABLET | Freq: Every day | ORAL | Status: DC
  Administered 2022-05-06 – 2022-05-17 (×12): 1 via ORAL
  Filled 2022-05-05 (×12): qty 1

## 2022-05-05 MED ORDER — LACTATED RINGERS IV SOLN: INTRAVENOUS | Status: DC

## 2022-05-05 MED ORDER — MAGNESIUM SULFATE BOLUS VIA INFUSION
4.0000 g | Freq: Once | INTRAVENOUS | Status: AC
  Administered 2022-05-05: 4 g via INTRAVENOUS
  Filled 2022-05-05: qty 1000

## 2022-05-05 MED ORDER — LABETALOL HCL 5 MG/ML IV SOLN: 80.0000 mg | INTRAVENOUS | Status: DC | PRN

## 2022-05-05 MED ORDER — DOCUSATE SODIUM 100 MG PO CAPS
100.0000 mg | ORAL_CAPSULE | Freq: Every day | ORAL | Status: DC
  Administered 2022-05-07 – 2022-05-17 (×10): 100 mg via ORAL
  Filled 2022-05-05 (×11): qty 1

## 2022-05-05 MED ORDER — NIFEDIPINE ER OSMOTIC RELEASE 60 MG PO TB24
60.0000 mg | ORAL_TABLET | Freq: Two times a day (BID) | ORAL | Status: DC
  Administered 2022-05-05 – 2022-05-17 (×25): 60 mg via ORAL
  Filled 2022-05-05 (×26): qty 1

## 2022-05-05 NOTE — H&P (Signed)
FACULTY PRACTICE ANTEPARTUM ADMISSION HISTORY AND PHYSICAL NOTE   History of Present Illness: Kim Miles is a 31 y.o. G1P0 at 37w1dadmitted for severe preeclampsia superimposed on CHTN in the setting of severe fetal growth restriction (EFW 1%), elevated UA dopplers with intermittent AEDF. Patient reports the fetal movement as active. Patient reports uterine contraction  activity as none. Patient reports  vaginal bleeding as none. Patient describes fluid per vagina as none. Fetal presentation is cephalic.  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 Hx of hospitalization at age [age]yof[age] heavy prolonged period (anemia?).  Formatting of this note might be different from the original. Treated with OCPs effectively. Hx of hospitalization at age [age]yofor heavy prolonged period (anemia?).    Past Surgical History:  Procedure Laterality Date   NO PAST SURGERIES      OB History  Gravida Para Term Preterm AB Living  1            SAB IAB Ectopic Multiple Live Births               # Outcome Date GA Lbr Len/2nd Weight Sex Delivery Anes PTL Lv  1  Current             Social History   Socioeconomic History   Marital status: Married    Spouse name: AAdilynn Bessey  Number of children: 0   Years of education: 12   Highest education level: Master's degree (e.g., MA, MS, MEng, MEd, MSW, MBA)  Occupational History   Not on file  Tobacco Use   Smoking status: Never   Smokeless tobacco: Never  Vaping Use   Vaping Use: Never used  Substance and Sexual Activity   Alcohol use: Never   Drug use: Never   Sexual activity: Yes    Birth control/protection: None  Other Topics Concern   Not on file  Social History Narrative   Not on file   Social Determinants of Health   Financial Resource Strain: Low Risk  (08/15/2019)   Overall Financial Resource Strain (CARDIA)    Difficulty of Paying Living Expenses: Not hard at all  Food Insecurity: No Food Insecurity (01/19/2022)   Hunger Vital Sign    Worried About Running Out of Food in the Last Year: Never true    Ran Out of Food in the Last Year: Never true  Transportation Needs: No Transportation Needs (08/15/2019)   PRAPARE - THydrologist(Medical): No    Lack of Transportation (Non-Medical): No  Physical Activity: Inactive (08/15/2019)   Exercise Vital Sign    Days of Exercise per Week: 0 days    Minutes of Exercise per Session:  0 min  Stress: No Stress Concern Present (08/15/2019)   [place]    Feeling of Stress : Only a little  Social Connections: Moderately Isolated (08/15/2019)   Social Connection and Isolation Panel [NHANES]    Frequency of Communication with Friends and Family: More than three times a week    Frequency of Social Gatherings with Friends and Family: Once a week    Attends Religious Services: More than 4 times per year    Active Member of [employer] or Organizations: No    Attends Music therapist: Never    Marital Status: Never married    Family History   Problem Relation Age of Onset   Asthma Mother    Hypertension Father    Anemia Sister    Eating disorder Sister    Cancer Maternal Grandmother    Cancer Maternal Grandfather    Diabetes Maternal Grandfather    Alzheimer's disease Maternal Grandfather    Heart attack Paternal Grandmother    Arthritis Paternal Grandmother     No Known Allergies  Medications Prior to Admission  Medication Sig Dispense Refill Last Dose   ACCU-CHEK GUIDE test strip USE UP TO FOUR TIMES DAILY AS DIRECTED.      Accu-Chek Softclix Lancets lancets SMARTSIG:Topical 1-4 Times Daily      aspirin EC 81 MG tablet Take 2 tablets (162 mg total) by mouth daily. Take after 12 weeks for prevention of preeclampsia later in pregnancy 300 tablet 2    blood glucose meter kit and supplies KIT Dispense based on patient and insurance preference. Use up to four times daily as directed. 1 each 0    escitalopram (LEXAPRO) 10 MG tablet Take 1 tablet (10 mg total) by mouth at bedtime. 90 tablet 3    NIFEdipine (ADALAT CC) 60 MG 24 hr tablet Take 1 tablet (60 mg total) by mouth at bedtime. 90 tablet 3    Prenatal MV & Min w/FA-DHA (PRENATAL GUMMIES PO) Take by mouth.       Review of Systems - Negative except what is mentioned in HPI  Vitals:  BP (!) 168/115 (BP Location: Left Arm) Comment: RN notified  Pulse 78   Temp 98.3 F (36.8 C) (Oral)   Resp 18   LMP 09/29/2021 (Exact Date)   SpO2 99%  Physical Examination: CONSTITUTIONAL: Well-developed, well-nourished female in no acute distress.  HENT:  Normocephalic, atraumatic, External right and left ear normal. Oropharynx is clear and moist EYES: Conjunctivae and EOM are normal. Pupils are equal, round, and reactive to light. No scleral icterus.  NECK: Normal range of motion, supple, no masses SKIN: Skin is warm and dry. No rash noted. Not diaphoretic. No erythema. No pallor. NEUROLOGIC: Alert and oriented to person, place, and time. Normal reflexes, muscle tone  coordination. No cranial nerve deficit noted. PSYCHIATRIC: Normal mood and affect. Normal behavior. Normal judgment and thought content. CARDIOVASCULAR: Normal heart rate noted, regular rhythm RESPIRATORY: Effort and breath sounds normal, no problems with respiration noted ABDOMEN: Soft, nontender, nondistended, gravid. CERVIX: Deferred MUSCULOSKELETAL: Normal range of motion. No edema and no tenderness. 2+ distal pulses, 3+ DTRs with one beat clonus  Labs:  No results found for this or any previous visit (from the past 24 hour(s)).  Imaging Studies: [place] MFM OB FOLLOW UP  Result Date: 05/05/2022 ----------------------------------------------------------------------  OBSTETRICS REPORT                       (  Signed Final 05/05/2022 05:21 pm) ---------------------------------------------------------------------- Patient Info  ID #:       [id number]                          D.O.B.:  May 13, 1991 (31 yrs)  Name:       [facility]OM[facility]IA [facility]                Visit Date: 05/05/2022 04:33 pm ---------------------------------------------------------------------- Performed By  Attending:        Sander Nephew      Ref. Address:     [place]  Performed By:     Rodrigo Ran BS      [facility]                                 Fetal Care at                                                             [place] for                                                             Women  Referred By:      [facility] ---------------------------------------------------------------------- Orders  #  Description                           Code        Ordered By  1  [place] MFM OB FOLLOW UP                   [phone]    YU FANG  2  [place] MFM FETAL BPP WO NON               76819.01    YU FANG     STRESS  3  [place] MFM UA CORD DOPPLER                70488.89    YU FANG  ----------------------------------------------------------------------  #  Order #                     Accession #                Episode #  1  [id number]  [id number]                 [id number]  2  [id number]                   [id number]                 [id number]  3  [id number]                   [id number]                 [id number] ---------------------------------------------------------------------- Indications  [redacted] weeks gestation of pregnancy                Y3K.16  Obesity complicating pregnancy, third          O99.213  trimester (nifedipine)  Diabetes - Pregestational,3rd trimester (diet  O24.313  control)  Hypertension - Chronic/Pre-existing            O10.019  LR-NIPS/AFP-Neg/Horizon-Neg  [redacted] weeks gestation of pregnancy                Z3A.28 ---------------------------------------------------------------------- Fetal Evaluation  Num Of Fetuses:         1  Fetal Heart Rate(bpm):  157  Cardiac Activity:       Observed  Presentation:           Cephalic  Placenta:               Anterior  P. Cord Insertion:      Visualized  Amniotic Fluid  AFI FV:      Subjectively low-normal  AFI Sum(cm)     %Tile       Largest Pocket(cm)  8.01            3.3         3.93  RUQ(cm)                     LUQ(cm)        LLQ(cm)  3.93                        2.72           1.36 ---------------------------------------------------------------------- Biometry  BPD:     73.87  mm     G. Age:  29w 4d          6  %    CI:        73.33   %    70 - 86                                                          FL/HC:      18.8   %    19.3 - 21.3  HC:    274.14   mm     G. Age:  30w 0d        2.3  %    HC/AC:      1.09        0.96 - 1.17  AC:    252.34   mm     G. Age:  29w 3d          8  %    FL/BPD:     69.9   %  71 - 87  FL:      51.64  mm     G. Age:  27w 4d        < 1  %    FL/AC:      20.5   %    20 - 24  HUM:      47.6  mm     G. Age:  28w 0d        < 5  %  LV:        2.9  mm  Est. FW:    1301  gm    2 lb 14 oz     1.7  %  ---------------------------------------------------------------------- OB History  Gravidity:    1         Term:   0        Prem:   0        SAB:   0  TOP:          0       Ectopic:  0        Living: 0 ---------------------------------------------------------------------- Gestational Age  LMP:           31w 1d        Date:  09/29/21                  EDD:   07/06/22  U/S Today:     29w 1d                                        EDD:   07/20/22  Best:          31w 1d     Det. By:  LMP  (09/29/21)          EDD:   07/06/22 ---------------------------------------------------------------------- Anatomy  Cranium:               Appears normal         Aortic Arch:            Previously seen  Cavum:                 Appears normal         Ductal Arch:            Previously seen  Ventricles:            Appears normal         Diaphragm:              Appears normal  Choroid Plexus:        Previously seen        Stomach:                Appears normal, left                                                                        sided  Cerebellum:            Previously seen        Abdomen:  Appears normal  Posterior Fossa:       Previously seen        Abdominal Wall:         Previously seen  Nuchal Fold:           Previously seen        Cord Vessels:           Previously seen  Face:                  Appears normal         Kidneys:                Appear normal                         (orbits and profile)  Lips:                  Previously seen        Bladder:                Appears normal  Thoracic:              Appears normal         Spine:                  Previously seen  Heart:                 Previously seen        Upper Extremities:      Previously seen  RVOT:                  Appears normal         Lower Extremities:      Previously seen  LVOT:                  Previously seen  Other:  VC, 3VV and 3VTV previously visualized. Nasal bone, lenses,          maxilla, mandible and falx previously visualized. Feet and open           hands/5th digits previously visualized. Fetus appears to be a female. ---------------------------------------------------------------------- Doppler - Fetal Vessels  Umbilical Artery   S/D     %tile      RI    %tile      PI    %tile     PSV    ADFV    RDFV                                                     (cm/s)   7.06   > 97.5    0.86   > 97.5    1.61   > 97.5    30.93     Yes      No ---------------------------------------------------------------------- Impression  MFM Brief Note  Ms. Brandes is a G1P0 who is seen at 33 w 1d for follow up  growth given chronic hypertension.  She is seen today at the request of Delsa Grana, PA-C.  She is overall doing well. She denies s/sx of preeclampsia  with exception of fatigue.  Her blood pressure today was 162/113 with a repeat of  153/111 mmHg.  A follow up ultrasound was performed today.  Positive interval growth with  measurements consistent with  severe fetal growth restriction with an EFW 1st%.  Good fetal movement and amniotic fluid volume  A BPP was performed and was 8/8.  The UA Dopplers are elevated with intermittent AEDF but no  evidence of AEDF was noted.  I discussed today's findings with Ms. Trenton Gammon. I explained that I  am concerned for superimposed preeclampsia.  We discussed the risk of placental abruption, fetal growth  restriction, stroke, stillbirth and eclampsia.  Therefore I recommend admission with preeclampsia  evaluation.  To include:  CBC, CMP, UPC  NST  Magnesium and BMZ.  IF her labs are normal we recommend discontinuing the  magnesium and titrating her blood pressure to maintain and  avg BP 135/85 mmHg.  Given FGR in the context of severe HTN we would  recommend delivery for abnormal trending labs, poor fetal  testing persistent reversed EDF or AEDF after 32 weeks.  If supper imposed preeclampsia is diagnosed inpatient  management with delivery at 34 weeks unless the UA  Dopplers become persistently AEDF.  While inpatient daily NST's, 2x weekly UA  Dopplers with  BPPs.  I discussed this plan of care with Elspeth Cho and Dr.  Nehemiah Settle.  All questions answered.  I spent 30 minutes with > 50% in face to face consultation  and care coordination.  Vikki Ports, MD ----------------------------------------------------------------------              Sander Nephew, MD Electronically Signed Final Report   05/05/2022 05:21 pm ----------------------------------------------------------------------  [place] MFM FETAL BPP WO NON STRESS  Result Date: 05/05/2022 ----------------------------------------------------------------------  OBSTETRICS REPORT                       (Signed Final 05/05/2022 05:21 pm) ---------------------------------------------------------------------- Patient Info  ID #:       [id number]                          D.O.B.:  September 10, 1991 (31 yrs)  Name:       [facility]                Visit Date: 05/05/2022 04:33 pm ---------------------------------------------------------------------- Performed By  Attending:        Sander Nephew      Ref. Address:     [place]  Performed By:     Rodrigo Ran BS      [facility]                    RD[facility] RVT                                 [facility] at  [facility] for                                                             Women  Referred By:      [place] ---------------------------------------------------------------------- Orders  #  Description                           Code        Ordered By  1  [place] MFM OB FOLLOW UP                   [phone]    Peterson Ao  2  [place] MFM FETAL BPP WO NON               76819.01    YU FANG     STRESS  3  [place] MFM UA CORD DOPPLER                76820.02    YU FANG ----------------------------------------------------------------------  #  Order #                     Accession #                 Episode #  1  [id number]                   [id number]                 [id number]  2  [id number]                   [id number]                 [id number]  3  [id number]                   [id number]                 [id number] ---------------------------------------------------------------------- Indications  [redacted] weeks gestation of pregnancy                U2P.53  Obesity complicating pregnancy, third          O99.213  trimester (nifedipine)  Diabetes - Pregestational,3rd trimester (diet  O24.313  control)  Hypertension - Chronic/Pre-existing            O10.019  LR-NIPS/AFP-Neg/Horizon-Neg  [redacted] weeks gestation of pregnancy                Z3A.28 ---------------------------------------------------------------------- Fetal Evaluation  Num Of Fetuses:         1  Fetal Heart Rate(bpm):  157  Cardiac Activity:       Observed  Presentation:           Cephalic  Placenta:               Anterior  P. Cord Insertion:      Visualized  Amniotic Fluid  AFI FV:      Subjectively low-normal  AFI Sum(cm)     %Tile       Largest Pocket(cm)  8.01            3.3         3.93  RUQ(cm)  LUQ(cm)        LLQ(cm)  3.93                        2.72           1.36 ---------------------------------------------------------------------- Biometry  BPD:     73.87  mm     G. Age:  29w 4d          6  %    CI:        73.33   %    70 - 86                                                          FL/HC:      18.8   %    19.3 - 21.3  HC:    274.14   mm     G. Age:  30w 0d        2.3  %    HC/AC:      1.09        0.96 - 1.17  AC:    252.34   mm     G. Age:  29w 3d          8  %    FL/BPD:     69.9   %    71 - 87  FL:      51.64  mm     G. Age:  27w 4d        < 1  %    FL/AC:      20.5   %    20 - 24  HUM:      47.6  mm     G. Age:  28w 0d        < 5  %  LV:        2.9  mm  Est. FW:    1301  gm    2 lb 14 oz     1.7  % ---------------------------------------------------------------------- OB History  Gravidity:    1         Term:   0        Prem:   0         SAB:   0  TOP:          0       Ectopic:  0        Living: 0 ---------------------------------------------------------------------- Gestational Age  LMP:           31w 1d        Date:  09/29/21                  EDD:   07/06/22  U/S Today:     29w 1d                                        EDD:   07/20/22  Best:          31w 1d     Det. By:  LMP  (09/29/21)          EDD:   07/06/22 ---------------------------------------------------------------------- Anatomy  Cranium:  Appears normal         Aortic Arch:            Previously seen  Cavum:                 Appears normal         Ductal Arch:            Previously seen  Ventricles:            Appears normal         Diaphragm:              Appears normal  Choroid Plexus:        Previously seen        Stomach:                Appears normal, left                                                                        sided  Cerebellum:            Previously seen        Abdomen:                Appears normal  Posterior Fossa:       Previously seen        Abdominal Wall:         Previously seen  Nuchal Fold:           Previously seen        Cord Vessels:           Previously seen  Face:                  Appears normal         Kidneys:                Appear normal                         (orbits and profile)  Lips:                  Previously seen        Bladder:                Appears normal  Thoracic:              Appears normal         Spine:                  Previously seen  Heart:                 Previously seen        Upper Extremities:      Previously seen  RVOT:                  Appears normal         Lower Extremities:      Previously seen  LVOT:                  Previously seen  Other:  VC, 3VV and 3VTV previously visualized. Nasal bone, lenses,  maxilla, mandible and falx previously visualized. Feet and open          hands/5th digits previously visualized. Fetus appears to be a female.  ---------------------------------------------------------------------- Doppler - Fetal Vessels  Umbilical Artery   S/D     %tile      RI    %tile      PI    %tile     PSV    ADFV    RDFV                                                     (cm/s)   7.06   > 97.5    0.86   > 97.5    1.61   > 97.5    30.93     Yes      No ---------------------------------------------------------------------- Impression  MFM Brief Note  Ms. Braddy is a G1P0 who is seen at 24 w 1d for follow up  growth given chronic hypertension.  She is seen today at the request of Delsa Grana, PA-C.  She is overall doing well. She denies s/sx of preeclampsia  with exception of fatigue.  Her blood pressure today was 162/113 with a repeat of  153/111 mmHg.  A follow up ultrasound was performed today.  Positive interval growth with measurements consistent with  severe fetal growth restriction with an EFW 1st%.  Good fetal movement and amniotic fluid volume  A BPP was performed and was 8/8.  The UA Dopplers are elevated with intermittent AEDF but no  evidence of AEDF was noted.  I discussed today's findings with Ms. Trenton Gammon. I explained that I  am concerned for superimposed preeclampsia.  We discussed the risk of placental abruption, fetal growth  restriction, stroke, stillbirth and eclampsia.  Therefore I recommend admission with preeclampsia  evaluation.  To include:  CBC, CMP, UPC  NST  Magnesium and BMZ.  IF her labs are normal we recommend discontinuing the  magnesium and titrating her blood pressure to maintain and  avg BP 135/85 mmHg.  Given FGR in the context of severe HTN we would  recommend delivery for abnormal trending labs, poor fetal  testing persistent reversed EDF or AEDF after 32 weeks.  If supper imposed preeclampsia is diagnosed inpatient  management with delivery at 34 weeks unless the UA  Dopplers become persistently AEDF.  While inpatient daily NST's, 2x weekly UA Dopplers with  BPPs.  I discussed this plan of care with Elspeth Cho  and Dr.  Nehemiah Settle.  All questions answered.  I spent 30 minutes with > 50% in face to face consultation  and care coordination.  Vikki Ports, MD ----------------------------------------------------------------------              Sander Nephew, MD Electronically Signed Final Report   05/05/2022 05:21 pm ----------------------------------------------------------------------  [place] MFM UA CORD DOPPLER  Result Date: 05/05/2022 ----------------------------------------------------------------------  OBSTETRICS REPORT                       (Signed Final 05/05/2022 05:21 pm) ---------------------------------------------------------------------- Patient Info  ID #:       [id number]                          D.O.B.:  Jan 14, 1991 (31 yrs)  Name:  Kim Miles                Visit Date: 05/05/2022 04:33 pm ---------------------------------------------------------------------- Performed By  Attending:        Sander Nephew      Ref. Address:     [place]  Performed By:     Rodrigo Ran BS      [facility]         [facility]                    RDMS RVT                                 Fetal Care at                                                             [place] for                                                             Women  Referred By:      [facility] ---------------------------------------------------------------------- Orders  #  Description                           Code        Ordered By  1  [place] MFM OB FOLLOW UP                   [phone]    YU FANG  2  [place] MFM FETAL BPP WO NON               76819.01    YU FANG     STRESS  3  [place] MFM UA CORD DOPPLER                63845.36    YU FANG ----------------------------------------------------------------------  #  Order #                     Accession #                Episode #  1  [id number]                   [id number]                 [id number]   2  [id number]                   [id number]  [id number]  3  [id number]                   [id number]                 [id number] ---------------------------------------------------------------------- Indications  [redacted] weeks gestation of pregnancy                A3E.94  Obesity complicating pregnancy, third          O99.213  trimester (nifedipine)  Diabetes - Pregestational,3rd trimester (diet  O24.313  control)  Hypertension - Chronic/Pre-existing            O10.019  LR-NIPS/AFP-Neg/Horizon-Neg  [redacted] weeks gestation of pregnancy                Z3A.28 ---------------------------------------------------------------------- Fetal Evaluation  Num Of Fetuses:         1  Fetal Heart Rate(bpm):  157  Cardiac Activity:       Observed  Presentation:           Cephalic  Placenta:               Anterior  P. Cord Insertion:      Visualized  Amniotic Fluid  AFI FV:      Subjectively low-normal  AFI Sum(cm)     %Tile       Largest Pocket(cm)  8.01            3.3         3.93  RUQ(cm)                     LUQ(cm)        LLQ(cm)  3.93                        2.72           1.36 ---------------------------------------------------------------------- Biometry  BPD:     73.87  mm     G. Age:  29w 4d          6  %    CI:        73.33   %    70 - 86                                                          FL/HC:      18.8   %    19.3 - 21.3  HC:    274.14   mm     G. Age:  30w 0d        2.3  %    HC/AC:      1.09        0.96 - 1.17  AC:    252.34   mm     G. Age:  29w 3d          8  %    FL/BPD:     69.9   %    71 - 87  FL:      51.64  mm     G. Age:  27w 4d        < 1  %    FL/AC:      20.5   %    20 - 24  HUM:  47.6  mm     G. Age:  28w 0d        < 5  %  LV:        2.9  mm  Est. FW:    1301  gm    2 lb 14 oz     1.7  % ---------------------------------------------------------------------- OB History  Gravidity:    1         Term:   0        Prem:   0        SAB:   0  TOP:          0       Ectopic:  0        Living: 0  ---------------------------------------------------------------------- Gestational Age  LMP:           31w 1d        Date:  09/29/21                  EDD:   07/06/22  U/S Today:     29w 1d                                        EDD:   07/20/22  Best:          31w 1d     Det. By:  LMP  (09/29/21)          EDD:   07/06/22 ---------------------------------------------------------------------- Anatomy  Cranium:               Appears normal         Aortic Arch:            Previously seen  Cavum:                 Appears normal         Ductal Arch:            Previously seen  Ventricles:            Appears normal         Diaphragm:              Appears normal  Choroid Plexus:        Previously seen        Stomach:                Appears normal, left                                                                        sided  Cerebellum:            Previously seen        Abdomen:                Appears normal  Posterior Fossa:       Previously seen        Abdominal Wall:         Previously seen  Nuchal Fold:           Previously seen        Cord Vessels:  Previously seen  Face:                  Appears normal         Kidneys:                Appear normal                         (orbits and profile)  Lips:                  Previously seen        Bladder:                Appears normal  Thoracic:              Appears normal         Spine:                  Previously seen  Heart:                 Previously seen        Upper Extremities:      Previously seen  RVOT:                  Appears normal         Lower Extremities:      Previously seen  LVOT:                  Previously seen  Other:  VC, 3VV and 3VTV previously visualized. Nasal bone, lenses,          maxilla, mandible and falx previously visualized. Feet and open          hands/5th digits previously visualized. Fetus appears to be a female. ---------------------------------------------------------------------- Doppler - Fetal Vessels  Umbilical Artery   S/D      %tile      RI    %tile      PI    %tile     PSV    ADFV    RDFV                                                     (cm/s)   7.06   > 97.5    0.86   > 97.5    1.61   > 97.5    30.93     Yes      No ---------------------------------------------------------------------- Impression  MFM Brief Note  Ms. Musa is a G1P0 who is seen at 76 w 1d for follow up  growth given chronic hypertension.  She is seen today at the request of Delsa Grana, PA-C.  She is overall doing well. She denies s/sx of preeclampsia  with exception of fatigue.  Her blood pressure today was 162/113 with a repeat of  153/111 mmHg.  A follow up ultrasound was performed today.  Positive interval growth with measurements consistent with  severe fetal growth restriction with an EFW 1st%.  Good fetal movement and amniotic fluid volume  A BPP was performed and was 8/8.  The UA Dopplers are elevated with intermittent AEDF but no  evidence of AEDF was noted.  I discussed today's findings with Ms. Trenton Gammon. I explained that I  am concerned for superimposed preeclampsia.  We discussed  the risk of placental abruption, fetal growth  restriction, stroke, stillbirth and eclampsia.  Therefore I recommend admission with preeclampsia  evaluation.  To include:  CBC, CMP, UPC  NST  Magnesium and BMZ.  IF her labs are normal we recommend discontinuing the  magnesium and titrating her blood pressure to maintain and  avg BP 135/85 mmHg.  Given FGR in the context of severe HTN we would  recommend delivery for abnormal trending labs, poor fetal  testing persistent reversed EDF or AEDF after 32 weeks.  If supper imposed preeclampsia is diagnosed inpatient  management with delivery at 34 weeks unless the UA  Dopplers become persistently AEDF.  While inpatient daily NST's, 2x weekly UA Dopplers with  BPPs.  I discussed this plan of care with Elspeth Cho and Dr.  Nehemiah Settle.  All questions answered.  I spent 30 minutes with > 50% in face to face consultation  and care  coordination.  Vikki Ports, MD ----------------------------------------------------------------------              Sander Nephew, MD Electronically Signed Final Report   05/05/2022 05:21 pm ----------------------------------------------------------------------  [place] MFM OB FOLLOW UP  Result Date: 04/15/2022 ----------------------------------------------------------------------  OBSTETRICS REPORT                       (Signed Final 04/15/2022 01:10 pm) ---------------------------------------------------------------------- Patient Info  ID #:       [id number]                          D.O.B.:  1991-04-03 (31 yrs)  Name:       [facility]                Visit Date: 04/15/2022 10:36 am ---------------------------------------------------------------------- Performed By  Attending:        Johnell Comings MD         Ref. Address:     [place]  Performed By:     Benson [place]          [facility]                                                             [place] [facility]  Referred By:      [facility] ---------------------------------------------------------------------- Orders  #  Description                           Code        Ordered By  1  [place] MFM OB FOLLOW UP                   98921.19    Sander Nephew ----------------------------------------------------------------------  #  Order #                     Accession #                Episode #  1  [id number]                   [id number]                 [id number] ---------------------------------------------------------------------- Indications  Obesity complicating pregnancy, third          O99.213  trimester (nifedipine)  Diabetes - Pregestational,3rd trimester (diet   O24.313  control)  Hypertension - Chronic/Pre-existing            O10.019  LR-NIPS/AFP-Neg/Horizon-Neg  [redacted] weeks gestation of pregnancy                Z3A.28 ---------------------------------------------------------------------- Fetal Evaluation  Num Of Fetuses:         1  Fetal Heart Rate(bpm):  133  Cardiac Activity:       Observed  Presentation:           Cephalic  Placenta:               Anterior  P. Cord Insertion:      Previously Visualized  Amniotic Fluid  AFI FV:      Within normal limits  AFI Sum(cm)     %Tile       Largest Pocket(cm)  12              28          4.34  RUQ(cm)       RLQ(cm)       LUQ(cm)        LLQ(cm)  4.34          4.31          1.89           1.46 ---------------------------------------------------------------------- Biometry  BPD:     69.53  mm     G. Age:  28w 0d         27  %    CI:        73.31   %    70 - 86                                                          FL/HC:      18.8   %  18.8 - 20.6  HC:    258.07   mm     G. Age:  28w 0d         14  %    HC/AC:      1.11        1.05 - 1.21  AC:    233.07   mm     G. Age:  27w 5d         24  %    FL/BPD:     69.9   %    71 - 87  FL:      48.59  mm     G. Age:  26w 2d        2.5  %    FL/AC:      20.8   %    20 - 24  Est. FW:    1048  gm      2 lb 5 oz     10  % ---------------------------------------------------------------------- OB History  Gravidity:    1         Term:   0        Prem:   0        SAB:   0  TOP:          0       Ectopic:  0        Living: 0 ---------------------------------------------------------------------- Gestational Age  LMP:           28w 2d        Date:  09/29/21                  EDD:   07/06/22  U/S Today:     27w 4d                                        EDD:   07/11/22  Best:          28w 2d     Det. By:  LMP  (09/29/21)          EDD:   07/06/22 ---------------------------------------------------------------------- Anatomy  Cranium:               Appears normal         Aortic Arch:            Previously  seen  Cavum:                 Appears normal         Ductal Arch:            Previously seen  Ventricles:            Previously seen        Diaphragm:              Appears normal  Choroid Plexus:        Previously seen        Stomach:                Appears normal, left  sided  Cerebellum:            Previously seen        Abdomen:                Appears normal  Posterior Fossa:       Previously seen        Abdominal Wall:         Previously seen  Nuchal Fold:           Previously seen        Cord Vessels:           Previously seen  Face:                  Orbits and profile     Kidneys:                Appear normal                         previously seen  Lips:                  Previously seen        Bladder:                Appears normal  Thoracic:              Appears normal         Spine:                  Previously seen  Heart:                 Previously seen        Upper Extremities:      Previously seen  RVOT:                  Previously seen        Lower Extremities:      Previously seen  LVOT:                  Previously seen  Other:  VC, 3VV and 3VTV previously visualized. Nasal bone, lenses,          maxilla, mandible and falx previously visualized. Feet and open          hands/5th digits previously visualized. Fetus appears to be a female. ---------------------------------------------------------------------- Doppler - Fetal Vessels  Umbilical Artery   S/D     %tile      RI    %tile      PI    %tile     PSV                                                     (cm/s)   3.83       85    0.74       85    1.31       93    37.17 ---------------------------------------------------------------------- Cervix Uterus Adnexa  Adnexa  No abnormality visualized. ---------------------------------------------------------------------- Comments  This patient was seen for a follow up growth scan due to  maternal obesity, chronic hypertension treated with   nifedipine, and diet-controlled gestational diabetes.  The  patient's blood pressures today were 137/104, 142/100, and  151/93.  She denies any signs or symptoms  of preeclampsia.  She had PIH labs and a P/C ratio drawn 6 days ago which  were all negative for preeclampsia.  She was informed that the fetal growth measures at the 10th  percentile for her gestational age.  There was normal  amniotic fluid noted today.  Doppler studies of the umbilical arteries performed today  shows a normal S/D ratio.  There were no signs of absent or  reversed end-diastolic flow noted.  The increased risk of IUGR due to her elevated blood  pressures was discussed.  Preeclampsia precautions were  reviewed today.  Due to chronic hypertension, we will start weekly BPP's at 32  weeks.  A follow-up growth scan and BPP was scheduled in 3 weeks. ----------------------------------------------------------------------                   Johnell Comings, MD Electronically Signed Final Report   04/15/2022 01:10 pm ----------------------------------------------------------------------    Assessment and Plan: Principal Problem:   Severe preeclampsia, third trimester Active Problems:   Supervision of high risk pregnancy, antepartum   Maternal morbid obesity, antepartum ([place])   Chronic hypertension affecting pregnancy   Gestational diabetes mellitus (GDM) affecting pregnancy, antepartum   IUGR (intrauterine growth restriction) affecting care of mother, third trimester   [redacted] weeks gestation of pregnancy  Admit to Antenatal Betamethasone x 2 doses Magnesium sulfate for eclampsia prophylaxis and neuroprotection Continue Procardia XL, increased to 60 mg po bid.  IV Labetalol ordered as needed for severe BP. Will check daily CBC, CMP and q72h T&S. Continuous FHR monitoring for now which will transition to NST BID when off magnesium.  Will do 2X weekly UA dopplers with BPPs (Tues-Fri). As per Dr. Gertie Exon: "If her labs are normal we recommend  discontinuing the magnesium and titrating her blood pressure to maintain and avg BP 135/85 mmHg.  Given FGR in the context of severe HTN we would recommend delivery for abnormal trending labs, poor fetal testing persistent reversed EDF or AEDF after 32 weeks.If supper imposed preeclampsia is diagnosed inpatient management with delivery at 34 weeks unless the UA Dopplers become persistently AEDF." Will check CBGs fasting, 2 hr PP. Expect elevation s/p BMZ, can cover with insulin as needed and patient was ordered for sensitive scale sliding scale.  Patient is aware that cesarean delivery will be indicated for any worsening fetal status, uncontrolled BP, worsening labs or other serious maternal-fetal concern. Neonatology consulted, OB Anesthesiology made aware of patient's admission. Will continue close observation and routine antenatal care   Verita Schneiders, MD, [place] Attending [place], [facility]

## 2022-05-05 NOTE — Progress Notes (Signed)
MFM Brief Note  Kim Miles is a G1P0 who is seen at 43 w 1d for follow up growth given chronic hypertension.   She is seen today at the request of Danelle Berry, PA-C.  She is overall doing well. She denies s/sx of preeclampsia with exception of fatigue.  Her blood pressure today was 162/113 with a repeat of 153/111 mmHg.  A follow up ultrasound was performed today.  Positive interval growth with measurements consistent with severe fetal growth restriction with an EFW 1st%. Good fetal movement and amniotic fluid volume  A BPP was performed and was 8/8. The UA Dopplers are elevated with intermittent AEDF but no evidence of AEDF was noted.  I discussed today's findings with Kim Miles. I explained that I am concerned for superimposed preeclampsia.  We discussed the risk of placental abruption, fetal growth restriction, stroke, stillbirth and eclampsia. Therefore I recommend admission with preeclampsia evaluation. To include: CBC, CMP, UPC NST Magnesium and BMZ.  IF her labs are normal we recommend discontinuing the magnesium and titrating her blood pressure to maintain and avg BP 135/85 mmHg.  Given FGR in the context of severe HTN we would recommend delivery for abnormal trending labs, poor fetal testing persistent reversed EDF or AEDF after 32 weeks.  If supper imposed preeclampsia is diagnosed inpatient management with delivery at 34 weeks unless the UA Dopplers become persistently AEDF.  While inpatient daily NST's, 2x weekly UA Dopplers with BPPs.  I discussed this plan of care with Kim Miles and Dr. Adrian Blackwater.  All questions answered.  I spent 30 minutes with > 50% in face to face consultation and care coordination.  Novella Olive, MD

## 2022-05-06 ENCOUNTER — Other Ambulatory Visit: Payer: Self-pay | Admitting: *Deleted

## 2022-05-06 ENCOUNTER — Encounter (HOSPITAL_COMMUNITY): Payer: Self-pay | Admitting: Obstetrics & Gynecology

## 2022-05-06 DIAGNOSIS — O365930 Maternal care for other known or suspected poor fetal growth, third trimester, not applicable or unspecified: Secondary | ICD-10-CM

## 2022-05-06 DIAGNOSIS — Z3A31 31 weeks gestation of pregnancy: Secondary | ICD-10-CM | POA: Diagnosis not present

## 2022-05-06 DIAGNOSIS — O10919 Unspecified pre-existing hypertension complicating pregnancy, unspecified trimester: Secondary | ICD-10-CM | POA: Diagnosis not present

## 2022-05-06 DIAGNOSIS — O1413 Severe pre-eclampsia, third trimester: Secondary | ICD-10-CM | POA: Diagnosis not present

## 2022-05-06 DIAGNOSIS — O10913 Unspecified pre-existing hypertension complicating pregnancy, third trimester: Secondary | ICD-10-CM

## 2022-05-06 LAB — GLUCOSE, CAPILLARY
Glucose-Capillary: 122 mg/dL — ABNORMAL HIGH (ref 70–99)
Glucose-Capillary: 130 mg/dL — ABNORMAL HIGH (ref 70–99)
Glucose-Capillary: 131 mg/dL — ABNORMAL HIGH (ref 70–99)
Glucose-Capillary: 146 mg/dL — ABNORMAL HIGH (ref 70–99)
Glucose-Capillary: 147 mg/dL — ABNORMAL HIGH (ref 70–99)
Glucose-Capillary: 207 mg/dL — ABNORMAL HIGH (ref 70–99)

## 2022-05-06 LAB — CBC
HCT: 37.5 % (ref 36.0–46.0)
Hemoglobin: 12.6 g/dL (ref 12.0–15.0)
MCH: 30.1 pg (ref 26.0–34.0)
MCHC: 33.6 g/dL (ref 30.0–36.0)
MCV: 89.7 fL (ref 80.0–100.0)
Platelets: 342 10*3/uL (ref 150–400)
RBC: 4.18 MIL/uL (ref 3.87–5.11)
RDW: 13.3 % (ref 11.5–15.5)
WBC: 16.7 10*3/uL — ABNORMAL HIGH (ref 4.0–10.5)
nRBC: 0 % (ref 0.0–0.2)

## 2022-05-06 LAB — COMPREHENSIVE METABOLIC PANEL
ALT: 15 U/L (ref 0–44)
AST: 27 U/L (ref 15–41)
Albumin: 2.7 g/dL — ABNORMAL LOW (ref 3.5–5.0)
Alkaline Phosphatase: 109 U/L (ref 38–126)
Anion gap: 10 (ref 5–15)
BUN: 9 mg/dL (ref 6–20)
CO2: 19 mmol/L — ABNORMAL LOW (ref 22–32)
Calcium: 7.1 mg/dL — ABNORMAL LOW (ref 8.9–10.3)
Chloride: 102 mmol/L (ref 98–111)
Creatinine, Ser: 0.87 mg/dL (ref 0.44–1.00)
GFR, Estimated: >60 mL/min (ref >60)
Glucose, Bld: 152 mg/dL — ABNORMAL HIGH (ref 70–99)
Potassium: 4.1 mmol/L (ref 3.5–5.1)
Sodium: 131 mmol/L — ABNORMAL LOW (ref 135–145)
Total Bilirubin: 0.3 mg/dL (ref 0.3–1.2)
Total Protein: 6.8 g/dL (ref 6.5–8.1)

## 2022-05-06 LAB — RPR: RPR Ser Ql: NONREACTIVE

## 2022-05-06 LAB — MAGNESIUM: Magnesium: 6.7 mg/dL (ref 1.7–2.4)

## 2022-05-06 MED ORDER — LABETALOL HCL 200 MG PO TABS
400.0000 mg | ORAL_TABLET | Freq: Three times a day (TID) | ORAL | Status: DC
  Administered 2022-05-06 – 2022-05-18 (×37): 400 mg via ORAL
  Filled 2022-05-06 (×37): qty 2

## 2022-05-06 MED ORDER — LABETALOL HCL 200 MG PO TABS
200.0000 mg | ORAL_TABLET | Freq: Three times a day (TID) | ORAL | Status: DC
  Administered 2022-05-06: 200 mg via ORAL
  Filled 2022-05-06: qty 1

## 2022-05-06 MED ORDER — ONDANSETRON HCL 4 MG/2ML IJ SOLN
4.0000 mg | Freq: Four times a day (QID) | INTRAMUSCULAR | Status: DC | PRN
  Administered 2022-05-06 (×2): 4 mg via INTRAVENOUS
  Filled 2022-05-06 (×3): qty 2

## 2022-05-06 NOTE — Consult Note (Signed)
[facility]   Prenatal Consult   05/05/22    I was asked by [facility] to consult on this patient for anticipated preterm delivery. I had the pleasure of meeting with Kim Miles. She is a 31yo G1P0 woman currently at [redacted]w[redacted]d gestation. She is expecting a baby boy, to be named Chiropodist. Pregnancy complicated by cHTN, prediabetes, IUGR now with superimposed preeclampsia elevated UA dopplers with intermittent AEDF. She is ordered for 2 doses of betamethasone.   I explained that the neonatal intensive care team would be present for the delivery and outlined the likely delivery room course for this baby including routine resuscitation and NRP-guided approaches to the treatment of respiratory distress. We discussed other common problems associated with prematurity including respiratory distress syndrome/CLD, apnea, feeding issues, temperature regulation. We briefly discussed IVH/PVL, ROP, infection and NEC and that these are complications associated with prematurity, but that by 32 weeks are uncommon.    We discussed the average length of stay but I noted that the actual LOS would depend on the severity of problems encountered and response to treatments. We discussed visitation policies and the resources available while her child is in the hospital.   We discussed the importance of good nutrition and various methods of providing nutrition (parenteral hyperalimentation, gavage feedings and/or oral feeding). We discussed the benefits of human milk. She is planning on providing breat milk. I encouraged pumping soon after birth, discussed oral care with colostrum, encouraged frequent skin-to-skin as able, and outlined resources that are available to support breast feeding. We discussed the possibility of using donor breast milk as a bridge to which she is amenable.    Thank you for involving [place] in the care of this patient. A member of our team will be available should the family  have additional questions.  Time for consultation: 45 minute total, approximately 20 minutes of face-to-face time in discussion of the risks and medical care associated with preterm delivery.         Servando Salina, MD Attending Neonatologist

## 2022-05-06 NOTE — Progress Notes (Signed)
Pt's mother called out for RN, stating that something wasn't right with the pt. RN went in room and pt was pale and vomiting. Pt's mother stated that the pt required a lot of assistance when she got up to the bathroom. Pt stated she felt like she was going to pass out. Pt alert and oriented x4 and RR 14. Dr. Jolayne Panther made aware. Received verbal order for stat mag level and to pause the magnesium until labs are back. Magnesium turned off at 10:44. Will continue to monitor.

## 2022-05-06 NOTE — Progress Notes (Signed)
Date and time results received: 05/06/22 12:15   Test: Magnesium Critical Value: 6.7  Name of Provider Notified: Dr. Jolayne Panther  Orders Received? Or Actions Taken?:  Magnesium infusion to remain off.

## 2022-05-06 NOTE — Progress Notes (Signed)
FACULTY PRACTICE ANTEPARTUM COMPREHENSIVE PROGRESS NOTE  Kim Miles is a 31 y.o. G1P0 at [redacted]w[redacted]d who is admitted for severe preeclampsia superimposed on CHTN in the setting of severe fetal growth restriction (EFW 1%), elevated UA dopplers with intermittent AEDF.  Estimated Date of Delivery: 07/06/22 Fetal presentation is cephalic.  Length of Stay:  1 Days. Admitted 05/05/2022  Subjective: Patient denies any headaches, visual symptoms, RUQ/epigastric pain or other concerning symptoms. Had some nausea alleviated with Zofran. Patient reports good fetal movement.  She reports no uterine contractions, no bleeding and no loss of fluid per vagina.  Vitals:  Blood pressure (!) 144/100, pulse 85, temperature 98 F (36.7 C), temperature source Oral, resp. rate 16, height 4\' 11"  (1.499 m), weight 89.8 kg, last menstrual period 09/29/2021, SpO2 96 %. Patient Vitals for the past 24 hrs:  BP Temp Temp src Pulse Resp SpO2 Height Weight  05/06/22 0659 (!) 144/100 -- -- 85 16 -- -- --  05/06/22 0602 -- -- -- -- 16 -- -- --  05/06/22 0500 -- -- -- -- 18 -- -- --  05/06/22 0439 (!) 156/106 -- -- 99 -- -- -- --  05/06/22 0400 -- -- -- -- 16 -- -- --  05/06/22 0300 (!) 143/95 -- -- 89 16 96 % -- --  05/06/22 0238 (!) 144/96 -- -- 86 -- -- -- --  05/06/22 0218 (!) 169/111 -- -- -- -- -- -- --  05/06/22 0159 (!) 162/108 -- -- 89 16 96 % -- --  05/06/22 0100 (!) 156/98 -- -- 81 16 95 % -- --  05/06/22 0005 (!) 155/100 -- -- 82 16 -- -- --  05/05/22 2256 (!) 156/101 -- -- 83 16 -- -- --  05/05/22 2205 (!) 157/108 -- -- 84 16 99 % -- --  05/05/22 2103 (!) 156/100 -- -- 89 16 99 % -- --  05/05/22 2003 (!) 151/99 98 F (36.7 C) Oral 77 16 95 % -- --  05/05/22 1906 (!) 155/103 -- -- 92 16 99 % -- --  05/05/22 1859 -- -- -- -- -- -- 4\' 11"  (1.499 m) 89.8 kg  05/05/22 1857 (!) 159/105 -- -- 91 16 -- -- --  05/05/22 1846 (!) 162/129 98.2 F (36.8 C) Oral 77 17 -- -- --  05/05/22 1818 (!) 168/115 98.3 F  (36.8 C) Oral 78 18 99 % -- --    Physical Examination: CONSTITUTIONAL: Well-developed, well-nourished female in no acute distress.  NEUROLOGIC: Alert and oriented to person, place, and time. No cranial nerve deficit noted. PSYCHIATRIC: Normal mood and affect. Normal behavior. Normal judgment and thought content. CARDIOVASCULAR: Normal heart rate noted, regular rhythm RESPIRATORY: Effort and breath sounds normal, no problems with respiration noted MUSCULOSKELETAL: Normal range of motion. No edema and no tenderness. 2+ distal pulses. ABDOMEN: Soft, nontender, nondistended, gravid. CERVIX:    Fetal monitoring: FHR: 135 bpm, Variability: moderate, Accelerations: 10 x 10 Present, Decelerations: Absent >> Category I Uterine activity: No contractions   Results for orders placed or performed during the hospital encounter of 05/05/22 (from the past 48 hour(s))  Protein / creatinine ratio, urine     Status: Abnormal   Collection Time: 05/05/22  9:26 PM  Result Value Ref Range   Creatinine, Urine 25 mg/dL   Total Protein, Urine 9 mg/dL    Comment: NO NORMAL RANGE ESTABLISHED FOR THIS TEST   Protein Creatinine Ratio 0.36 (H) 0.00 - 0.15 mg/mg[Cre]    Comment: Performed at [facility]  Lab, [address], [place], [place] [address]  Hemoglobin A1c     Status: Abnormal   Collection Time: 05/05/22  9:52 PM  Result Value Ref Range   Hgb A1c MFr Bld 5.8 (H) 4.8 - 5.6 %    Comment: (NOTE) Pre diabetes:          5.7%-6.4%  Diabetes:              >6.4%  Glycemic control for   <7.0% adults with diabetes    Mean Plasma Glucose 119.76 mg/dL    Comment: Performed at [facility], [address], [place], [place] [address]  CBC     Status: Abnormal   Collection Time: 05/05/22  9:52 PM  Result Value Ref Range   WBC 18.5 (H) 4.0 - 10.5 K/uL   RBC 4.11 3.87 - 5.11 MIL/uL   Hemoglobin 12.4 12.0 - 15.0 g/dL   HCT 09.8 11.9 - 14.7 %   MCV 87.8 80.0 - 100.0 fL   MCH 30.2 26.0 - 34.0 pg    MCHC 34.3 30.0 - 36.0 g/dL   RDW 82.9 56.2 - 13.0 %   Platelets 330 150 - 400 K/uL   nRBC 0.0 0.0 - 0.2 %    Comment: Performed at [facility], [address], [place], [place] [address]  Comprehensive metabolic panel     Status: Abnormal   Collection Time: 05/05/22  9:52 PM  Result Value Ref Range   Sodium 130 (L) 135 - 145 mmol/L   Potassium 4.5 3.5 - 5.1 mmol/L   Chloride 102 98 - 111 mmol/L   CO2 19 (L) 22 - 32 mmol/L   Glucose, Bld 213 (H) 70 - 99 mg/dL    Comment: Glucose reference range applies only to samples taken after fasting for at least 8 hours.   BUN 10 6 - 20 mg/dL   Creatinine, Ser 4.69 0.44 - 1.00 mg/dL   Calcium 8.1 (L) 8.9 - 10.3 mg/dL   Total Protein 6.7 6.5 - 8.1 g/dL   Albumin 2.6 (L) 3.5 - 5.0 g/dL   AST 23 15 - 41 U/L   ALT 13 0 - 44 U/L   Alkaline Phosphatase 110 38 - 126 U/L   Total Bilirubin 0.3 0.3 - 1.2 mg/dL   GFR, Estimated >62 >95 mL/min    Comment: (NOTE) Calculated using the CKD-EPI Creatinine Equation (2021)    Anion gap 9 5 - 15    Comment: Performed at [facility], [address] [place], [place] [address]  Type and screen [facility]     Status: None   Collection Time: 05/05/22  9:52 PM  Result Value Ref Range   ABO/RH(D) A POS    Antibody Screen NEG    Sample Expiration      05/08/2022,2359 Performed at [facility], [address], [place], [place] [address]   Glucose, capillary     Status: Abnormal   Collection Time: 05/06/22 12:15 AM  Result Value Ref Range   Glucose-Capillary 122 (H) 70 - 99 mg/dL    Comment: Glucose reference range applies only to samples taken after fasting for at least 8 hours.  Glucose, capillary     Status: Abnormal   Collection Time: 05/06/22  5:20 AM  Result Value Ref Range   Glucose-Capillary 146 (H) 70 - 99 mg/dL    Comment: Glucose reference range applies only to samples taken after fasting for at least 8  hours.  CBC     Status: Abnormal   Collection Time:  05/06/22  5:24 AM  Result Value Ref Range   WBC 16.7 (H) 4.0 - 10.5 K/uL   RBC 4.18 3.87 - 5.11 MIL/uL   Hemoglobin 12.6 12.0 - 15.0 g/dL   HCT 16.137.5 09.636.0 - 04.546.0 %   MCV 89.7 80.0 - 100.0 fL   MCH 30.1 26.0 - 34.0 pg   MCHC 33.6 30.0 - 36.0 g/dL   RDW 40.913.3 81.111.5 - 91.415.5 %   Platelets 342 150 - 400 K/uL   nRBC 0.0 0.0 - 0.2 %    Comment: Performed at [facility], [address] [place], [place] [address]  Comprehensive metabolic panel     Status: Abnormal   Collection Time: 05/06/22  5:24 AM  Result Value Ref Range   Sodium 131 (L) 135 - 145 mmol/L   Potassium 4.1 3.5 - 5.1 mmol/L   Chloride 102 98 - 111 mmol/L   CO2 19 (L) 22 - 32 mmol/L   Glucose, Bld 152 (H) 70 - 99 mg/dL    Comment: Glucose reference range applies only to samples taken after fasting for at least 8 hours.   BUN 9 6 - 20 mg/dL   Creatinine, Ser 6.210.87 0.44 - 1.00 mg/dL   Calcium 7.1 (L) 8.9 - 10.3 mg/dL   Total Protein 6.8 6.5 - 8.1 g/dL   Albumin 2.7 (L) 3.5 - 5.0 g/dL   AST 27 15 - 41 U/L   ALT 15 0 - 44 U/L   Alkaline Phosphatase 109 38 - 126 U/L   Total Bilirubin 0.3 0.3 - 1.2 mg/dL   GFR, Estimated >30>60 >86>60 mL/min    Comment: (NOTE) Calculated using the CKD-EPI Creatinine Equation (2021)    Anion gap 10 5 - 15    Comment: Performed at [facility], [address] [place], [place] [address]    US MFM OB FOLLOW UP  Result Date: 05/05/2022 ----------------------------------------------------------------------  OBSTETRICS REPORT                       (Signed Final 05/05/2022 05:21 pm) ---------------------------------------------------------------------- Patient Info  ID #:       [id number]                          D.O.B.:  May 16, 1991 (31 yrs)  Name:       Kim Miles [facility]                Visit Date: 05/05/2022 04:33 pm ---------------------------------------------------------------------- Performed By  Attending:        Lin Landsmanorenthian Booker      Ref. Address:     [address]                    [address]                                                              [address]  Performed By:     Eden Lathearrie Stalter BS      [facility]  Referred By:      [facility] ---------------------------------------------------------------------- Orders  #  Description                           Code        Ordered By  1  [place] MFM OB FOLLOW UP                   [phone]    YU FANG  2  [place] MFM FETAL BPP WO NON               76819.01    YU FANG     STRESS  3  [place] MFM UA CORD DOPPLER                76820.02    YU FANG ----------------------------------------------------------------------  #  Order #                     Accession #                Episode #  1  [id number]                   [id number]                 [id number]  2  [id number]                   [id number]                 [id number]  3  [id number]                   [id number]                 [id number] ---------------------------------------------------------------------- Indications  [redacted] weeks gestation of pregnancy                Z3A.31  Obesity complicating pregnancy, third          O99.213  trimester (nifedipine)  Diabetes - Pregestational,3rd trimester (diet  O24.313  control)  Hypertension - Chronic/Pre-existing            O10.019  LR-NIPS/AFP-Neg/Horizon-Neg  [redacted] weeks gestation of pregnancy                Z3A.28 ---------------------------------------------------------------------- Fetal Evaluation  Num Of Fetuses:         1  Fetal Heart Rate(bpm):  157  Cardiac Activity:       Observed  Presentation:  Cephalic  Placenta:               Anterior  P. Cord Insertion:      Visualized  Amniotic Fluid  AFI FV:      Subjectively low-normal  AFI Sum(cm)     %Tile       Largest Pocket(cm)  8.01            3.3         3.93  RUQ(cm)                      LUQ(cm)        LLQ(cm)  3.93                        2.72           1.36 ---------------------------------------------------------------------- Biometry  BPD:     73.87  mm     G. Age:  29w 4d          6  %    CI:        73.33   %    70 - 86                                                          FL/HC:      18.8   %    19.3 - 21.3  HC:    274.14   mm     G. Age:  30w 0d        2.3  %    HC/AC:      1.09        0.96 - 1.17  AC:    252.34   mm     G. Age:  29w 3d          8  %    FL/BPD:     69.9   %    71 - 87  FL:      51.64  mm     G. Age:  27w 4d        < 1  %    FL/AC:      20.5   %    20 - 24  HUM:      47.6  mm     G. Age:  28w 0d        < 5  %  LV:        2.9  mm  Est. FW:    1301  gm    2 lb 14 oz     1.7  % ---------------------------------------------------------------------- OB History  Gravidity:    1         Term:   0        Prem:   0        SAB:   0  TOP:          0       Ectopic:  0        Living: 0 ---------------------------------------------------------------------- Gestational Age  LMP:           31w 1d        Date:  09/29/21  EDD:   07/06/22  U/S Today:     29w 1d                                        EDD:   07/20/22  Best:          31w 1d     Det. By:  LMP  (09/29/21)          EDD:   07/06/22 ---------------------------------------------------------------------- Anatomy  Cranium:               Appears normal         Aortic Arch:            Previously seen  Cavum:                 Appears normal         Ductal Arch:            Previously seen  Ventricles:            Appears normal         Diaphragm:              Appears normal  Choroid Plexus:        Previously seen        Stomach:                Appears normal, left                                                                        sided  Cerebellum:            Previously seen        Abdomen:                Appears normal  Posterior Fossa:       Previously seen        Abdominal Wall:         Previously seen  Nuchal Fold:            Previously seen        Cord Vessels:           Previously seen  Face:                  Appears normal         Kidneys:                Appear normal                         (orbits and profile)  Lips:                  Previously seen        Bladder:                Appears normal  Thoracic:              Appears normal         Spine:                  Previously seen  Heart:                 Previously seen        Upper Extremities:      Previously seen  RVOT:                  Appears normal         Lower Extremities:      Previously seen  LVOT:                  Previously seen  Other:  VC, 3VV and 3VTV previously visualized. Nasal bone, lenses,          maxilla, mandible and falx previously visualized. Feet and open          hands/5th digits previously visualized. Fetus appears to be a female. ---------------------------------------------------------------------- Doppler - Fetal Vessels  Umbilical Artery   S/D     %tile      RI    %tile      PI    %tile     PSV    ADFV    RDFV                                                     (cm/s)   7.06   > 97.5    0.86   > 97.5    1.61   > 97.5    30.93     Yes      No ---------------------------------------------------------------------- Impression  MFM Brief Note  Ms. Lapre is a G1P0 who is seen at 53 w 1d for follow up  growth given chronic hypertension.  She is seen today at the request of Danelle Berry, PA-C.  She is overall doing well. She denies s/sx of preeclampsia  with exception of fatigue.  Her blood pressure today was 162/113 with a repeat of  153/111 mmHg.  A follow up ultrasound was performed today.  Positive interval growth with measurements consistent with  severe fetal growth restriction with an EFW 1st%.  Good fetal movement and amniotic fluid volume  A BPP was performed and was 8/8.  The UA Dopplers are elevated with intermittent AEDF but no  evidence of AEDF was noted.  I discussed today's findings with Ms. Dutch Quint. I explained that I  am concerned for superimposed  preeclampsia.  We discussed the risk of placental abruption, fetal growth  restriction, stroke, stillbirth and eclampsia.  Therefore I recommend admission with preeclampsia  evaluation.  To include:  CBC, CMP, UPC  NST  Magnesium and BMZ.  IF her labs are normal we recommend discontinuing the  magnesium and titrating her blood pressure to maintain and  avg BP 135/85 mmHg.  Given FGR in the context of severe HTN we would  recommend delivery for abnormal trending labs, poor fetal  testing persistent reversed EDF or AEDF after 32 weeks.  If supper imposed preeclampsia is diagnosed inpatient  management with delivery at 34 weeks unless the UA  Dopplers become persistently AEDF.  While inpatient daily NST's, 2x weekly UA Dopplers with  BPPs.  I discussed this plan of care with Precious Gilding and Dr.  Adrian Blackwater.  All questions answered.  I spent 30 minutes with > 50% in face to face consultation  and care coordination.  Novella Olive, MD ----------------------------------------------------------------------  Lin Landsman, MD Electronically Signed Final Report   05/05/2022 05:21 pm ----------------------------------------------------------------------  [place] MFM FETAL BPP WO NON STRESS  Result Date: 05/05/2022 ----------------------------------------------------------------------  OBSTETRICS REPORT                       (Signed Final 05/05/2022 05:21 pm) ---------------------------------------------------------------------- Patient Info  ID #:       [id number]                          D.O.B.:  [birth year] (31 yrs)  Name:       Kim Dec [facility]                Visit Date: 05/05/2022 04:33 pm ---------------------------------------------------------------------- Performed By  Attending:        Lin Landsman      Ref. Address:     [address]                    [address]                                                             [address]  Performed By:     Eden Lathe BS      [facility]  Referred By:      [facility] ---------------------------------------------------------------------- Orders  #  Description                           Code        Ordered By  1  [place] MFM OB FOLLOW UP                   [phone]    YU FANG  2  [place] MFM FETAL BPP WO NON               76819.01    YU FANG     STRESS  3  [place] MFM UA CORD DOPPLER                14782.95    YU FANG ----------------------------------------------------------------------  #  Order #  Accession #                Episode #  1  [id number]                   [id number]                 [id number]  2  [id number]                   [id number]                 [id number]  3  [id number]                   [id number]                 [id number] ---------------------------------------------------------------------- Indications  [redacted] weeks gestation of pregnancy                Z3A.31  Obesity complicating pregnancy, third          O99.213  trimester (nifedipine)  Diabetes - Pregestational,3rd trimester (diet  O24.313  control)  Hypertension - Chronic/Pre-existing            O10.019  LR-NIPS/AFP-Neg/Horizon-Neg  [redacted] weeks gestation of pregnancy                Z3A.28 ---------------------------------------------------------------------- Fetal Evaluation  Num Of Fetuses:         1  Fetal Heart Rate(bpm):  157  Cardiac Activity:       Observed  Presentation:           Cephalic  Placenta:               Anterior  P. Cord Insertion:      Visualized  Amniotic Fluid  AFI FV:      Subjectively low-normal  AFI Sum(cm)     %Tile       Largest Pocket(cm)  8.01            3.3         3.93  RUQ(cm)                     LUQ(cm)        LLQ(cm)  3.93                        2.72           1.36  ---------------------------------------------------------------------- Biometry  BPD:     73.87  mm     G. Age:  29w 4d          6  %    CI:        73.33   %    70 - 86                                                          FL/HC:      18.8   %    19.3 - 21.3  HC:    274.14   mm     G. Age:  30w 0d        2.3  %    HC/AC:      1.09        0.96 - 1.17  AC:  252.34   mm     G. Age:  29w 3d          8  %    FL/BPD:     69.9   %    71 - 87  FL:      51.64  mm     G. Age:  27w 4d        < 1  %    FL/AC:      20.5   %    20 - 24  HUM:      47.6  mm     G. Age:  28w 0d        < 5  %  LV:        2.9  mm  Est. FW:    1301  gm    2 lb 14 oz     1.7  % ---------------------------------------------------------------------- OB History  Gravidity:    1         Term:   0        Prem:   0        SAB:   0  TOP:          0       Ectopic:  0        Living: 0 ---------------------------------------------------------------------- Gestational Age  LMP:           31w 1d        Date:  09/29/21                  EDD:   07/06/22  U/S Today:     29w 1d                                        EDD:   07/20/22  Best:          31w 1d     Det. By:  LMP  (09/29/21)          EDD:   07/06/22 ---------------------------------------------------------------------- Anatomy  Cranium:               Appears normal         Aortic Arch:            Previously seen  Cavum:                 Appears normal         Ductal Arch:            Previously seen  Ventricles:            Appears normal         Diaphragm:              Appears normal  Choroid Plexus:        Previously seen        Stomach:                Appears normal, left  sided  Cerebellum:            Previously seen        Abdomen:                Appears normal  Posterior Fossa:       Previously seen        Abdominal Wall:         Previously seen  Nuchal Fold:           Previously seen        Cord Vessels:           Previously seen  Face:                   Appears normal         Kidneys:                Appear normal                         (orbits and profile)  Lips:                  Previously seen        Bladder:                Appears normal  Thoracic:              Appears normal         Spine:                  Previously seen  Heart:                 Previously seen        Upper Extremities:      Previously seen  RVOT:                  Appears normal         Lower Extremities:      Previously seen  LVOT:                  Previously seen  Other:  VC, 3VV and 3VTV previously visualized. Nasal bone, lenses,          maxilla, mandible and falx previously visualized. Feet and open          hands/5th digits previously visualized. Fetus appears to be a female. ---------------------------------------------------------------------- Doppler - Fetal Vessels  Umbilical Artery   S/D     %tile      RI    %tile      PI    %tile     PSV    ADFV    RDFV                                                     (cm/s)   7.06   > 97.5    0.86   > 97.5    1.61   > 97.5    30.93     Yes      No ---------------------------------------------------------------------- Impression  MFM Brief Note  Ms. Camino is a G1P0 who is seen at 75 w 1d for follow up  growth given chronic hypertension.  She is seen today at the request of Danelle Berry, PA-C.  She is overall  doing well. She denies s/sx of preeclampsia  with exception of fatigue.  Her blood pressure today was 162/113 with a repeat of  153/111 mmHg.  A follow up ultrasound was performed today.  Positive interval growth with measurements consistent with  severe fetal growth restriction with an EFW 1st%.  Good fetal movement and amniotic fluid volume  A BPP was performed and was 8/8.  The UA Dopplers are elevated with intermittent AEDF but no  evidence of AEDF was noted.  I discussed today's findings with Ms. Dutch Quint. I explained that I  am concerned for superimposed preeclampsia.  We discussed the risk of placental abruption, fetal  growth  restriction, stroke, stillbirth and eclampsia.  Therefore I recommend admission with preeclampsia  evaluation.  To include:  CBC, CMP, UPC  NST  Magnesium and BMZ.  IF her labs are normal we recommend discontinuing the  magnesium and titrating her blood pressure to maintain and  avg BP 135/85 mmHg.  Given FGR in the context of severe HTN we would  recommend delivery for abnormal trending labs, poor fetal  testing persistent reversed EDF or AEDF after 32 weeks.  If supper imposed preeclampsia is diagnosed inpatient  management with delivery at 34 weeks unless the UA  Dopplers become persistently AEDF.  While inpatient daily NST's, 2x weekly UA Dopplers with  BPPs.  I discussed this plan of care with Precious Gilding and Dr.  Adrian Blackwater.  All questions answered.  I spent 30 minutes with > 50% in face to face consultation  and care coordination.  Novella Olive, MD ----------------------------------------------------------------------              Lin Landsman, MD Electronically Signed Final Report   05/05/2022 05:21 pm ----------------------------------------------------------------------  [place] MFM UA CORD DOPPLER  Result Date: 05/05/2022 ----------------------------------------------------------------------  OBSTETRICS REPORT                       (Signed Final 05/05/2022 05:21 pm) ---------------------------------------------------------------------- Patient Info  ID #:       [id number]                          D.O.B.:  03/14/1991 (31 yrs)  Name:       Kim Dec [facility]                Visit Date: 05/05/2022 04:33 pm ---------------------------------------------------------------------- Performed By  Attending:        Lin Landsman      Ref. Address:     [address]                    [address]  Performed By:     Eden Lathe BS      [facility] RVT  [facility]  Referred By:      [facility] ---------------------------------------------------------------------- Orders  #  Description                           Code        Ordered By  1  [place] MFM OB FOLLOW UP                   [phone]    YU FANG  2  [place] MFM FETAL BPP WO NON               76819.01    YU FANG     STRESS  3  [place] MFM UA CORD DOPPLER                76820.02    YU FANG ----------------------------------------------------------------------  #  Order #                     Accession #                Episode #  1  [id number]                   [id number]                 [id number]  2  [id number]                   [id number]                 [id number]  3  [id number]                   [id number]                 [id number] ---------------------------------------------------------------------- Indications  [redacted] weeks gestation of pregnancy                Z3A.31  Obesity complicating pregnancy, third          O99.213  trimester (nifedipine)  Diabetes - Pregestational,3rd trimester (diet  O24.313  control)  Hypertension - Chronic/Pre-existing            O10.019  LR-NIPS/AFP-Neg/Horizon-Neg  [redacted] weeks gestation of pregnancy                Z3A.28 ---------------------------------------------------------------------- Fetal Evaluation  Num Of Fetuses:         1  Fetal Heart Rate(bpm):  157  Cardiac Activity:       Observed  Presentation:           Cephalic  Placenta:               Anterior  P. Cord Insertion:  Visualized  Amniotic Fluid  AFI FV:      Subjectively low-normal  AFI Sum(cm)     %Tile       Largest Pocket(cm)  8.01            3.3         3.93  RUQ(cm)                     LUQ(cm)        LLQ(cm)  3.93                        2.72           1.36 ---------------------------------------------------------------------- Biometry  BPD:     73.87  mm     G. Age:  29w 4d          6  %     CI:        73.33   %    70 - 86                                                          FL/HC:      18.8   %    19.3 - 21.3  HC:    274.14   mm     G. Age:  30w 0d        2.3  %    HC/AC:      1.09        0.96 - 1.17  AC:    252.34   mm     G. Age:  29w 3d          8  %    FL/BPD:     69.9   %    71 - 87  FL:      51.64  mm     G. Age:  27w 4d        < 1  %    FL/AC:      20.5   %    20 - 24  HUM:      47.6  mm     G. Age:  28w 0d        < 5  %  LV:        2.9  mm  Est. FW:    1301  gm    2 lb 14 oz     1.7  % ---------------------------------------------------------------------- OB History  Gravidity:    1         Term:   0        Prem:   0        SAB:   0  TOP:          0       Ectopic:  0        Living: 0 ---------------------------------------------------------------------- Gestational Age  LMP:           31w 1d        Date:  09/29/21                  EDD:   07/06/22  U/S Today:     29w 1d  EDD:   07/20/22  Best:          31w 1d     Det. By:  LMP  (09/29/21)          EDD:   07/06/22 ---------------------------------------------------------------------- Anatomy  Cranium:               Appears normal         Aortic Arch:            Previously seen  Cavum:                 Appears normal         Ductal Arch:            Previously seen  Ventricles:            Appears normal         Diaphragm:              Appears normal  Choroid Plexus:        Previously seen        Stomach:                Appears normal, left                                                                        sided  Cerebellum:            Previously seen        Abdomen:                Appears normal  Posterior Fossa:       Previously seen        Abdominal Wall:         Previously seen  Nuchal Fold:           Previously seen        Cord Vessels:           Previously seen  Face:                  Appears normal         Kidneys:                Appear normal                         (orbits and profile)  Lips:                   Previously seen        Bladder:                Appears normal  Thoracic:              Appears normal         Spine:                  Previously seen  Heart:                 Previously seen        Upper Extremities:      Previously seen  RVOT:  Appears normal         Lower Extremities:      Previously seen  LVOT:                  Previously seen  Other:  VC, 3VV and 3VTV previously visualized. Nasal bone, lenses,          maxilla, mandible and falx previously visualized. Feet and open          hands/5th digits previously visualized. Fetus appears to be a female. ---------------------------------------------------------------------- Doppler - Fetal Vessels  Umbilical Artery   S/D     %tile      RI    %tile      PI    %tile     PSV    ADFV    RDFV                                                     (cm/s)   7.06   > 97.5    0.86   > 97.5    1.61   > 97.5    30.93     Yes      No ---------------------------------------------------------------------- Impression  MFM Brief Note  Ms. Vanburen is a G1P0 who is seen at 8 w 1d for follow up  growth given chronic hypertension.  She is seen today at the request of Danelle Berry, PA-C.  She is overall doing well. She denies s/sx of preeclampsia  with exception of fatigue.  Her blood pressure today was 162/113 with a repeat of  153/111 mmHg.  A follow up ultrasound was performed today.  Positive interval growth with measurements consistent with  severe fetal growth restriction with an EFW 1st%.  Good fetal movement and amniotic fluid volume  A BPP was performed and was 8/8.  The UA Dopplers are elevated with intermittent AEDF but no  evidence of AEDF was noted.  I discussed today's findings with Ms. Dutch Quint. I explained that I  am concerned for superimposed preeclampsia.  We discussed the risk of placental abruption, fetal growth  restriction, stroke, stillbirth and eclampsia.  Therefore I recommend admission with preeclampsia  evaluation.  To include:  CBC,  CMP, UPC  NST  Magnesium and BMZ.  IF her labs are normal we recommend discontinuing the  magnesium and titrating her blood pressure to maintain and  avg BP 135/85 mmHg.  Given FGR in the context of severe HTN we would  recommend delivery for abnormal trending labs, poor fetal  testing persistent reversed EDF or AEDF after 32 weeks.  If supper imposed preeclampsia is diagnosed inpatient  management with delivery at 34 weeks unless the UA  Dopplers become persistently AEDF.  While inpatient daily NST's, 2x weekly UA Dopplers with  BPPs.  I discussed this plan of care with Precious Gilding and Dr.  Adrian Blackwater.  All questions answered.  I spent 30 minutes with > 50% in face to face consultation  and care coordination.  Novella Olive, MD ----------------------------------------------------------------------              Lin Landsman, MD Electronically Signed Final Report   05/05/2022 05:21 pm ----------------------------------------------------------------------   Current scheduled medications  aspirin EC  162 mg Oral Daily   betamethasone acetate-betamethasone sodium phosphate  12 mg Intramuscular Q24H   docusate sodium  100  mg Oral Daily   escitalopram  10 mg Oral QHS   insulin aspart  0-14 Units Subcutaneous TID PC   labetalol  400 mg Oral TID   NIFEdipine  60 mg Oral BID   prenatal multivitamin  1 tablet Oral Q1200    I have reviewed the patient's current medications.  ASSESSMENT: Principal Problem:   Severe preeclampsia, third trimester Active Problems:   Supervision of high risk pregnancy, antepartum   Maternal morbid obesity, antepartum (HCC)   Chronic hypertension affecting pregnancy   Gestational diabetes mellitus (GDM) affecting pregnancy, antepartum   IUGR (intrauterine growth restriction) affecting care of mother, third trimester   [redacted] weeks gestation of pregnancy   PLAN: Increased  Labetalol to 400 mg po tid, continue Procardia XL 60 mg for BP control. Continue to monitor  closely. Continue magnesium sulfate for now, consider discontinuing when BP control improves. Labs reassuring, no evidence of HELLP.  Category I FHR tracing. Continuous FHR monitoring for now which will transition to NST BID when off magnesium.  Will do 2X weekly UA dopplers with BPPs (Tues-Fri). As per Dr. Grace Bushy: "If her labs are normal we recommend discontinuing the magnesium and titrating her blood pressure to maintain and avg BP 135/85 mmHg.  Given FGR in the context of severe HTN we would recommend delivery for abnormal trending labs, poor fetal testing persistent reversed EDF or AEDF after 32 weeks.If supper imposed preeclampsia is diagnosed inpatient management with delivery at 34 weeks unless the UA Dopplers become persistently AEDF." Elevated CBG BMZ,  patient was ordered for sensitive scale sliding scale.  Patient is aware that cesarean delivery will be indicated for any worsening fetal status, uncontrolled BP, worsening labs or other serious maternal-fetal concern. Neonatology consult done, appreciate their input. OB Anesthesiology made aware of patient's admission. Will continue close observation and routine antenatal care   Jaynie Collins, MD, FACOG Obstetrician & Gynecologist, [facility] for [employer], [facility]

## 2022-05-07 DIAGNOSIS — O365930 Maternal care for other known or suspected poor fetal growth, third trimester, not applicable or unspecified: Secondary | ICD-10-CM

## 2022-05-07 DIAGNOSIS — O24419 Gestational diabetes mellitus in pregnancy, unspecified control: Secondary | ICD-10-CM

## 2022-05-07 DIAGNOSIS — O1413 Severe pre-eclampsia, third trimester: Secondary | ICD-10-CM | POA: Diagnosis not present

## 2022-05-07 DIAGNOSIS — O10919 Unspecified pre-existing hypertension complicating pregnancy, unspecified trimester: Secondary | ICD-10-CM | POA: Diagnosis not present

## 2022-05-07 LAB — GLUCOSE, CAPILLARY
Glucose-Capillary: 135 mg/dL — ABNORMAL HIGH (ref 70–99)
Glucose-Capillary: 177 mg/dL — ABNORMAL HIGH (ref 70–99)
Glucose-Capillary: 189 mg/dL — ABNORMAL HIGH (ref 70–99)
Glucose-Capillary: 177 mg/dL — ABNORMAL HIGH (ref 70–99)
Glucose-Capillary: 127 mg/dL — ABNORMAL HIGH (ref 70–99)

## 2022-05-07 LAB — COMPREHENSIVE METABOLIC PANEL
ALT: 15 U/L (ref 0–44)
AST: 21 U/L (ref 15–41)
Albumin: 2.5 g/dL — ABNORMAL LOW (ref 3.5–5.0)
Alkaline Phosphatase: 101 U/L (ref 38–126)
Anion gap: 8 (ref 5–15)
BUN: 14 mg/dL (ref 6–20)
CO2: 20 mmol/L — ABNORMAL LOW (ref 22–32)
Calcium: 7.9 mg/dL — ABNORMAL LOW (ref 8.9–10.3)
Chloride: 107 mmol/L (ref 98–111)
Creatinine, Ser: 0.76 mg/dL (ref 0.44–1.00)
GFR, Estimated: >60 mL/min (ref >60)
Glucose, Bld: 146 mg/dL — ABNORMAL HIGH (ref 70–99)
Potassium: 4.3 mmol/L (ref 3.5–5.1)
Sodium: 135 mmol/L (ref 135–145)
Total Bilirubin: 0.2 mg/dL — ABNORMAL LOW (ref 0.3–1.2)
Total Protein: 6.3 g/dL — ABNORMAL LOW (ref 6.5–8.1)

## 2022-05-07 LAB — CBC
HCT: 35.5 % — ABNORMAL LOW (ref 36.0–46.0)
Hemoglobin: 11.6 g/dL — ABNORMAL LOW (ref 12.0–15.0)
MCH: 30.1 pg (ref 26.0–34.0)
MCHC: 32.7 g/dL (ref 30.0–36.0)
MCV: 92 fL (ref 80.0–100.0)
Platelets: 295 10*3/uL (ref 150–400)
RBC: 3.86 MIL/uL — ABNORMAL LOW (ref 3.87–5.11)
RDW: 13.7 % (ref 11.5–15.5)
WBC: 16.3 10*3/uL — ABNORMAL HIGH (ref 4.0–10.5)
nRBC: 0.1 % (ref 0.0–0.2)

## 2022-05-07 NOTE — Progress Notes (Signed)
Patient ID: Kim Miles, female   DOB: Apr 11, 1991, 31 y.o.   MRN: [id number] FACULTY PRACTICE ANTEPARTUM COMPREHENSIVE PROGRESS NOTE  Kim Miles is a 31 y.o. G1P0 at [redacted]w[redacted]d who is admitted for severe preeclampsia superimposed on CHTN in the setting of severe fetal growth restriction (EFW 1%), elevated UA dopplers with intermittent AEDF.  Estimated Date of Delivery: 07/06/22 Fetal presentation is cephalic.  Length of Stay:  2 Days. Admitted 05/05/2022  Subjective: Patient denies any headaches, visual symptoms, RUQ/epigastric pain or other concerning symptoms.  Patient reports good fetal movement.  She reports no uterine contractions, no bleeding and no loss of fluid per vagina.  Vitals:  Blood pressure 138/90, pulse 77, temperature 98.1 F (36.7 C), temperature source Oral, resp. rate 16, height  (1.499 m), weight 89.8 kg, last menstrual period 09/29/2021, SpO2 98 %. Patient Vitals for the past 24 hrs:  BP Temp Temp src Pulse Resp SpO2  05/07/22 0730 -- -- -- -- -- 98 %  05/07/22 0729 138/90 98.1 F (36.7 C) Oral 77 16 99 %  05/07/22 0728 (!) 145/91 -- -- 74 -- --  05/07/22 0442 132/80 98.1 F (36.7 C) Oral 87 16 100 %  05/06/22 2357 140/88 98.2 F (36.8 C) Oral 84 15 100 %  05/06/22 2058 (!) 144/88 98.4 F (36.9 C) Oral 81 14 100 %  05/06/22 1516 (!) 132/95 -- Oral 81 -- 99 %  05/06/22 1143 128/82 98 F (36.7 C) Oral 81 15 98 %  05/06/22 1046 -- -- -- -- 14 --  05/06/22 1000 -- -- -- -- 18 --  05/06/22 0900 -- -- -- -- 18 --    Physical Examination: CONSTITUTIONAL: Well-developed, well-nourished female in no acute distress.  NEUROLOGIC: Alert and oriented to person, place, and time. No cranial nerve deficit noted. PSYCHIATRIC: Normal mood and affect. Normal behavior. Normal judgment and thought content. CARDIOVASCULAR: Normal heart rate noted, regular rhythm RESPIRATORY: Effort and breath sounds normal, no problems with respiration noted MUSCULOSKELETAL: Normal  range of motion. No edema and no tenderness. 2+ distal pulses. ABDOMEN: Soft, nontender, nondistended, gravid. CERVIX:    Fetal monitoring: FHR: 140 bpm, Variability: moderate, Accelerations: 10 x 10 Present, Decelerations: Absent >> Category I Uterine activity: No contractions   Results for orders placed or performed during the hospital encounter of 05/05/22 (from the past 48 hour(s))  Protein / creatinine ratio, urine     Status: Abnormal   Collection Time: 05/05/22  9:26 PM  Result Value Ref Range   Creatinine, Urine 25 mg/dL   Total Protein, Urine 9 mg/dL    Comment: NO NORMAL RANGE ESTABLISHED FOR THIS TEST   Protein Creatinine Ratio 0.36 (H) 0.00 - 0.15 mg/mg[Cre]    Comment: Performed at [facility], [address], [place], [place] [address]  RPR     Status: None   Collection Time: 05/05/22  9:52 PM  Result Value Ref Range   RPR Ser Ql NON REACTIVE NON REACTIVE    Comment: Performed at [facility], [address] [place], [place] [address]  Hemoglobin A1c     Status: Abnormal   Collection Time: 05/05/22  9:52 PM  Result Value Ref Range   Hgb A1c MFr Bld 5.8 (H) 4.8 - 5.6 %    Comment: (NOTE) Pre diabetes:          5.7%-6.4%  Diabetes:              >6.4%  Glycemic control  for   <7.0% adults with diabetes    Mean Plasma Glucose 119.76 mg/dL    Comment: Performed at [facility], [address], [place], [place] [address]  CBC     Status: Abnormal   Collection Time: 05/05/22  9:52 PM  Result Value Ref Range   WBC 18.5 (H) 4.0 - 10.5 K/uL   RBC 4.11 3.87 - 5.11 MIL/uL   Hemoglobin 12.4 12.0 - 15.0 g/dL   HCT 01.0 93.2 - 35.5 %   MCV 87.8 80.0 - 100.0 fL   MCH 30.2 26.0 - 34.0 pg   MCHC 34.3 30.0 - 36.0 g/dL   RDW 73.2 20.2 - 54.2 %   Platelets 330 150 - 400 K/uL   nRBC 0.0 0.0 - 0.2 %    Comment: Performed at [facility], [address], [place], [place] [address]  Comprehensive metabolic panel     Status: Abnormal   Collection Time:  05/05/22  9:52 PM  Result Value Ref Range   Sodium 130 (L) 135 - 145 mmol/L   Potassium 4.5 3.5 - 5.1 mmol/L   Chloride 102 98 - 111 mmol/L   CO2 19 (L) 22 - 32 mmol/L   Glucose, Bld 213 (H) 70 - 99 mg/dL    Comment: Glucose reference range applies only to samples taken after fasting for at least 8 hours.   BUN 10 6 - 20 mg/dL   Creatinine, Ser 7.62 0.44 - 1.00 mg/dL   Calcium 8.1 (L) 8.9 - 10.3 mg/dL   Total Protein 6.7 6.5 - 8.1 g/dL   Albumin 2.6 (L) 3.5 - 5.0 g/dL   AST 23 15 - 41 U/L   ALT 13 0 - 44 U/L   Alkaline Phosphatase 110 38 - 126 U/L   Total Bilirubin 0.3 0.3 - 1.2 mg/dL   GFR, Estimated >83 >15 mL/min    Comment: (NOTE) Calculated using the CKD-EPI Creatinine Equation (2021)    Anion gap 9 5 - 15    Comment: Performed at [facility], [address], [place], [place] [address]  Type and screen [facility]     Status: None   Collection Time: 05/05/22  9:52 PM  Result Value Ref Range   ABO/RH(D) A POS    Antibody Screen NEG    Sample Expiration      05/08/2022,2359 Performed at [facility], [address] [place], [place] [address]   Glucose, capillary     Status: Abnormal   Collection Time: 05/06/22 12:15 AM  Result Value Ref Range   Glucose-Capillary 122 (H) 70 - 99 mg/dL    Comment: Glucose reference range applies only to samples taken after fasting for at least 8 hours.  Glucose, capillary     Status: Abnormal   Collection Time: 05/06/22  5:20 AM  Result Value Ref Range   Glucose-Capillary 146 (H) 70 - 99 mg/dL    Comment: Glucose reference range applies only to samples taken after fasting for at least 8 hours.  CBC     Status: Abnormal   Collection Time: 05/06/22  5:24 AM  Result Value Ref Range   WBC 16.7 (H) 4.0 - 10.5 K/uL   RBC 4.18 3.87 - 5.11 MIL/uL   Hemoglobin 12.6 12.0 - 15.0 g/dL   HCT 06.2 69.4 - 85.4 %   MCV 89.7 80.0 - 100.0 fL   MCH 30.1 26.0 - 34.0 pg   MCHC 33.6 30.0 - 36.0  g/dL   RDW 16.1 09.6 - 04.5  %   Platelets 342 150 - 400 K/uL   nRBC 0.0 0.0 - 0.2 %    Comment: Performed at [facility], [address], [place], [place] [address]  Comprehensive metabolic panel     Status: Abnormal   Collection Time: 05/06/22  5:24 AM  Result Value Ref Range   Sodium 131 (L) 135 - 145 mmol/L   Potassium 4.1 3.5 - 5.1 mmol/L   Chloride 102 98 - 111 mmol/L   CO2 19 (L) 22 - 32 mmol/L   Glucose, Bld 152 (H) 70 - 99 mg/dL    Comment: Glucose reference range applies only to samples taken after fasting for at least 8 hours.   BUN 9 6 - 20 mg/dL   Creatinine, Ser 1.91 0.44 - 1.00 mg/dL   Calcium 7.1 (L) 8.9 - 10.3 mg/dL   Total Protein 6.8 6.5 - 8.1 g/dL   Albumin 2.7 (L) 3.5 - 5.0 g/dL   AST 27 15 - 41 U/L   ALT 15 0 - 44 U/L   Alkaline Phosphatase 109 38 - 126 U/L   Total Bilirubin 0.3 0.3 - 1.2 mg/dL   GFR, Estimated >47 >82 mL/min    Comment: (NOTE) Calculated using the CKD-EPI Creatinine Equation (2021)    Anion gap 10 5 - 15    Comment: Performed at [facility], [address], [place], [place] [address]  Magnesium     Status: Abnormal   Collection Time: 05/06/22 10:52 AM  Result Value Ref Range   Magnesium 6.7 (HH) 1.7 - 2.4 mg/dL    Comment: CRITICAL RESULT CALLED TO, READ BACK BY AND VERIFIED WITH JENN COOKE RN.  ON 7.12.23 BY TCALDWELL MT. RESULT CONFIRMED BY MANUAL DILUTION Performed at [facility], [address] [place], [place] [address]   Glucose, capillary     Status: Abnormal   Collection Time: 05/06/22 11:41 AM  Result Value Ref Range   Glucose-Capillary 130 (H) 70 - 99 mg/dL    Comment: Glucose reference range applies only to samples taken after fasting for at least 8 hours.  Glucose, capillary     Status: Abnormal   Collection Time: 05/06/22  3:15 PM  Result Value Ref Range   Glucose-Capillary 131 (H) 70 - 99 mg/dL    Comment: Glucose reference range applies only to samples taken after fasting for at least 8 hours.  Glucose, capillary      Status: Abnormal   Collection Time: 05/06/22  8:55 PM  Result Value Ref Range   Glucose-Capillary 207 (H) 70 - 99 mg/dL    Comment: Glucose reference range applies only to samples taken after fasting for at least 8 hours.   Comment 1 Notify RN   Glucose, capillary     Status: Abnormal   Collection Time: 05/06/22 10:12 PM  Result Value Ref Range   Glucose-Capillary 147 (H) 70 - 99 mg/dL    Comment: Glucose reference range applies only to samples taken after fasting for at least 8 hours.   Comment 1 Notify RN   CBC     Status: Abnormal   Collection Time: 05/07/22  5:15 AM  Result Value Ref Range   WBC 16.3 (H) 4.0 - 10.5 K/uL   RBC 3.86 (L) 3.87 - 5.11 MIL/uL   Hemoglobin 11.6 (L) 12.0 - 15.0 g/dL   HCT 78.4 (L) 69.6 - 29.5 %   MCV 92.0 80.0 - 100.0 fL  MCH 30.1 26.0 - 34.0 pg   MCHC 32.7 30.0 - 36.0 g/dL   RDW 16.1 09.6 - 04.5 %   Platelets 295 150 - 400 K/uL   nRBC 0.1 0.0 - 0.2 %    Comment: Performed at [facility], [address] [place], [place] [address]  Comprehensive metabolic panel     Status: Abnormal   Collection Time: 05/07/22  5:15 AM  Result Value Ref Range   Sodium 135 135 - 145 mmol/L   Potassium 4.3 3.5 - 5.1 mmol/L   Chloride 107 98 - 111 mmol/L   CO2 20 (L) 22 - 32 mmol/L   Glucose, Bld 146 (H) 70 - 99 mg/dL    Comment: Glucose reference range applies only to samples taken after fasting for at least 8 hours.   BUN 14 6 - 20 mg/dL   Creatinine, Ser 1.91 0.44 - 1.00 mg/dL   Calcium 7.9 (L) 8.9 - 10.3 mg/dL   Total Protein 6.3 (L) 6.5 - 8.1 g/dL   Albumin 2.5 (L) 3.5 - 5.0 g/dL   AST 21 15 - 41 U/L   ALT 15 0 - 44 U/L   Alkaline Phosphatase 101 38 - 126 U/L   Total Bilirubin 0.2 (L) 0.3 - 1.2 mg/dL   GFR, Estimated >47 >82 mL/min    Comment: (NOTE) Calculated using the CKD-EPI Creatinine Equation (2021)    Anion gap 8 5 - 15    Comment: Performed at [facility], [address], [place], [place] [address]  Glucose, capillary     Status:  Abnormal   Collection Time: 05/07/22  6:04 AM  Result Value Ref Range   Glucose-Capillary 135 (H) 70 - 99 mg/dL    Comment: Glucose reference range applies only to samples taken after fasting for at least 8 hours.   Comment 1 Notify RN     [place] MFM OB FOLLOW UP  Result Date: 05/05/2022 ----------------------------------------------------------------------  OBSTETRICS REPORT                       (Signed Final 05/05/2022 05:21 pm) ---------------------------------------------------------------------- Patient Info  ID #:       [id number]                          D.O.B.:  January 04, 1991 (31 yrs)  Name:       Kim Miles                Visit Date: 05/05/2022 04:33 pm ---------------------------------------------------------------------- Performed By  Attending:        Lin Landsman      Ref. Address:     [address]                    [address]                                                             [address]  Performed By:     Eden Lathe BS      [facility]  Referred By:      [facility] ---------------------------------------------------------------------- Orders  #  Description                           Code        Ordered By  1  [place] MFM OB FOLLOW UP                   [phone]    YU FANG  2  [place] MFM FETAL BPP WO NON               76819.01    YU FANG     STRESS  3  [place] MFM UA CORD DOPPLER                76820.02    YU FANG ----------------------------------------------------------------------  #  Order #                     Accession #                Episode #  1  [id number]                   [id number]                 [id number]  2  [id number]                   [id number]                 [id number]  3  [id number]                   [id number]                 [id number]  ---------------------------------------------------------------------- Indications  [redacted] weeks gestation of pregnancy                Z3A.31  Obesity complicating pregnancy, third          O99.213  trimester (nifedipine)  Diabetes - Pregestational,3rd trimester (diet  O24.313  control)  Hypertension - Chronic/Pre-existing            O10.019  LR-NIPS/AFP-Neg/Horizon-Neg  [redacted] weeks gestation of pregnancy                Z3A.28 ---------------------------------------------------------------------- Fetal Evaluation  Num Of Fetuses:         1  Fetal Heart Rate(bpm):  157  Cardiac Activity:       Observed  Presentation:           Cephalic  Placenta:               Anterior  P. Cord Insertion:  Visualized  Amniotic Fluid  AFI FV:      Subjectively low-normal  AFI Sum(cm)     %Tile       Largest Pocket(cm)  8.01            3.3         3.93  RUQ(cm)                     LUQ(cm)        LLQ(cm)  3.93                        2.72           1.36 ---------------------------------------------------------------------- Biometry  BPD:     73.87  mm     G. Age:  29w 4d          6  %    CI:        73.33   %    70 - 86                                                          FL/HC:      18.8   %    19.3 - 21.3  HC:    274.14   mm     G. Age:  30w 0d        2.3  %    HC/AC:      1.09        0.96 - 1.17  AC:    252.34   mm     G. Age:  29w 3d          8  %    FL/BPD:     69.9   %    71 - 87  FL:      51.64  mm     G. Age:  27w 4d        < 1  %    FL/AC:      20.5   %    20 - 24  HUM:      47.6  mm     G. Age:  28w 0d        < 5  %  LV:        2.9  mm  Est. FW:    1301  gm    2 lb 14 oz     1.7  % ---------------------------------------------------------------------- OB History  Gravidity:    1         Term:   0        Prem:   0        SAB:   0  TOP:          0       Ectopic:  0        Living: 0 ---------------------------------------------------------------------- Gestational Age  LMP:           31w 1d        Date:  09/29/21                   EDD:   07/06/22  U/S Today:     29w 1d  EDD:   07/20/22  Best:          31w 1d     Det. By:  LMP  (09/29/21)          EDD:   07/06/22 ---------------------------------------------------------------------- Anatomy  Cranium:               Appears normal         Aortic Arch:            Previously seen  Cavum:                 Appears normal         Ductal Arch:            Previously seen  Ventricles:            Appears normal         Diaphragm:              Appears normal  Choroid Plexus:        Previously seen        Stomach:                Appears normal, left                                                                        sided  Cerebellum:            Previously seen        Abdomen:                Appears normal  Posterior Fossa:       Previously seen        Abdominal Wall:         Previously seen  Nuchal Fold:           Previously seen        Cord Vessels:           Previously seen  Face:                  Appears normal         Kidneys:                Appear normal                         (orbits and profile)  Lips:                  Previously seen        Bladder:                Appears normal  Thoracic:              Appears normal         Spine:                  Previously seen  Heart:                 Previously seen        Upper Extremities:      Previously seen  RVOT:  Appears normal         Lower Extremities:      Previously seen  LVOT:                  Previously seen  Other:  VC, 3VV and 3VTV previously visualized. Nasal bone, lenses,          maxilla, mandible and falx previously visualized. Feet and open          hands/5th digits previously visualized. Fetus appears to be a female. ---------------------------------------------------------------------- Doppler - Fetal Vessels  Umbilical Artery   S/D     %tile      RI    %tile      PI    %tile     PSV    ADFV    RDFV                                                     (cm/s)   7.06   > 97.5    0.86   >  97.5    1.61   > 97.5    30.93     Yes      No ---------------------------------------------------------------------- Impression  MFM Brief Note  Ms. Dutch Quintoole is a G1P0 who is seen at 8631 w 1d for follow up  growth given chronic hypertension.  She is seen today at the request of Danelle BerryLeisa Tapia, PA-C.  She is overall doing well. She denies s/sx of preeclampsia  with exception of fatigue.  Her blood pressure today was 162/113 with a repeat of  153/111 mmHg.  A follow up ultrasound was performed today.  Positive interval growth with measurements consistent with  severe fetal growth restriction with an EFW 1st%.  Good fetal movement and amniotic fluid volume  A BPP was performed and was 8/8.  The UA Dopplers are elevated with intermittent AEDF but no  evidence of AEDF was noted.  I discussed today's findings with Ms. Dutch QuintPoole. I explained that I  am concerned for superimposed preeclampsia.  We discussed the risk of placental abruption, fetal growth  restriction, stroke, stillbirth and eclampsia.  Therefore I recommend admission with preeclampsia  evaluation.  To include:  CBC, CMP, UPC  NST  Magnesium and BMZ.  IF her labs are normal we recommend discontinuing the  magnesium and titrating her blood pressure to maintain and  avg BP 135/85 mmHg.  Given FGR in the context of severe HTN we would  recommend delivery for abnormal trending labs, poor fetal  testing persistent reversed EDF or AEDF after 32 weeks.  If supper imposed preeclampsia is diagnosed inpatient  management with delivery at 34 weeks unless the UA  Dopplers become persistently AEDF.  While inpatient daily NST's, 2x weekly UA Dopplers with  BPPs.  I discussed this plan of care with Precious GildingSamantha Wienhold and Dr.  Adrian BlackwaterStinson.  All questions answered.  I spent 30 minutes with > 50% in face to face consultation  and care coordination.  Novella Oliveorenthian J. Booker, MD ----------------------------------------------------------------------              Lin Landsmanorenthian Booker, MD Electronically  Signed Final Report   05/05/2022 05:21 pm ----------------------------------------------------------------------  US MFM FETAL BPP WO NON STRESS  Result Date: 05/05/2022 ----------------------------------------------------------------------  OBSTETRICS REPORT                       (  Signed Final 05/05/2022 05:21 pm) ---------------------------------------------------------------------- Patient Info  ID #:       [id number]                          D.O.B.:  03-27-1991 (31 yrs)  Name:       Kim Miles                Visit Date: 05/05/2022 04:33 pm ---------------------------------------------------------------------- Performed By  Attending:        Lin Landsman      Ref. Address:     [address]                    [address]                                                             [address]  Performed By:     Eden Lathe BS      [facility]                    RDMS RVT                                 [facility]  Referred By:      [facility] ---------------------------------------------------------------------- Orders  #  Description                           Code        Ordered By  1  [place] MFM OB FOLLOW UP                   [phone]    YU FANG  2  [place] MFM FETAL BPP WO NON               76819.01    YU FANG     STRESS  3  [place] MFM UA CORD DOPPLER                14782.95    YU FANG ----------------------------------------------------------------------  #  Order #                     Accession #                Episode #  1  [id number]  [id number]                 [id number]  2  [id number]                   [id number]                 [id number]  3  [id number]                   [id number]                 [id number] ---------------------------------------------------------------------- Indications  [redacted] weeks gestation of pregnancy                 Z3A.31  Obesity complicating pregnancy, third          O99.213  trimester (nifedipine)  Diabetes - Pregestational,3rd trimester (diet  O24.313  control)  Hypertension - Chronic/Pre-existing            O10.019  LR-NIPS/AFP-Neg/Horizon-Neg  [redacted] weeks gestation of pregnancy                Z3A.28 ---------------------------------------------------------------------- Fetal Evaluation  Num Of Fetuses:         1  Fetal Heart Rate(bpm):  157  Cardiac Activity:       Observed  Presentation:           Cephalic  Placenta:               Anterior  P. Cord Insertion:      Visualized  Amniotic Fluid  AFI FV:      Subjectively low-normal  AFI Sum(cm)     %Tile       Largest Pocket(cm)  8.01            3.3         3.93  RUQ(cm)                     LUQ(cm)        LLQ(cm)  3.93                        2.72           1.36 ---------------------------------------------------------------------- Biometry  BPD:     73.87  mm     G. Age:  29w 4d          6  %    CI:        73.33   %    70 - 86                                                          FL/HC:      18.8   %    19.3 - 21.3  HC:    274.14   mm     G. Age:  30w 0d        2.3  %    HC/AC:      1.09        0.96 - 1.17  AC:    252.34   mm     G. Age:  29w 3d          8  %    FL/BPD:     69.9   %  71 - 87  FL:      51.64  mm     G. Age:  27w 4d        < 1  %    FL/AC:      20.5   %    20 - 24  HUM:      47.6  mm     G. Age:  28w 0d        < 5  %  LV:        2.9  mm  Est. FW:    1301  gm    2 lb 14 oz     1.7  % ---------------------------------------------------------------------- OB History  Gravidity:    1         Term:   0        Prem:   0        SAB:   0  TOP:          0       Ectopic:  0        Living: 0 ---------------------------------------------------------------------- Gestational Age  LMP:           31w 1d        Date:  09/29/21                  EDD:   07/06/22  U/S Today:     29w 1d                                        EDD:   07/20/22  Best:          31w 1d     Det. By:   LMP  (09/29/21)          EDD:   07/06/22 ---------------------------------------------------------------------- Anatomy  Cranium:               Appears normal         Aortic Arch:            Previously seen  Cavum:                 Appears normal         Ductal Arch:            Previously seen  Ventricles:            Appears normal         Diaphragm:              Appears normal  Choroid Plexus:        Previously seen        Stomach:                Appears normal, left                                                                        sided  Cerebellum:            Previously seen        Abdomen:  Appears normal  Posterior Fossa:       Previously seen        Abdominal Wall:         Previously seen  Nuchal Fold:           Previously seen        Cord Vessels:           Previously seen  Face:                  Appears normal         Kidneys:                Appear normal                         (orbits and profile)  Lips:                  Previously seen        Bladder:                Appears normal  Thoracic:              Appears normal         Spine:                  Previously seen  Heart:                 Previously seen        Upper Extremities:      Previously seen  RVOT:                  Appears normal         Lower Extremities:      Previously seen  LVOT:                  Previously seen  Other:  VC, 3VV and 3VTV previously visualized. Nasal bone, lenses,          maxilla, mandible and falx previously visualized. Feet and open          hands/5th digits previously visualized. Fetus appears to be a female. ---------------------------------------------------------------------- Doppler - Fetal Vessels  Umbilical Artery   S/D     %tile      RI    %tile      PI    %tile     PSV    ADFV    RDFV                                                     (cm/s)   7.06   > 97.5    0.86   > 97.5    1.61   > 97.5    30.93     Yes      No ---------------------------------------------------------------------- Impression   MFM Brief Note  Kim Miles is a G1P0 who is seen at 50 w 1d for follow up  growth given chronic hypertension.  She is seen today at the request of Danelle Berry, PA-C.  She is overall doing well. She denies s/sx of preeclampsia  with exception of fatigue.  Her blood pressure today was 162/113 with a repeat of  153/111 mmHg.  A follow up ultrasound was performed today.  Positive interval growth with  measurements consistent with  severe fetal growth restriction with an EFW 1st%.  Good fetal movement and amniotic fluid volume  A BPP was performed and was 8/8.  The UA Dopplers are elevated with intermittent AEDF but no  evidence of AEDF was noted.  I discussed today's findings with Kim Miles. I explained that I  am concerned for superimposed preeclampsia.  We discussed the risk of placental abruption, fetal growth  restriction, stroke, stillbirth and eclampsia.  Therefore I recommend admission with preeclampsia  evaluation.  To include:  CBC, CMP, UPC  NST  Magnesium and BMZ.  IF her labs are normal we recommend discontinuing the  magnesium and titrating her blood pressure to maintain and  avg BP 135/85 mmHg.  Given FGR in the context of severe HTN we would  recommend delivery for abnormal trending labs, poor fetal  testing persistent reversed EDF or AEDF after 32 weeks.  If supper imposed preeclampsia is diagnosed inpatient  management with delivery at 34 weeks unless the UA  Dopplers become persistently AEDF.  While inpatient daily NST's, 2x weekly UA Dopplers with  BPPs.  I discussed this plan of care with Precious Gilding and Dr.  Adrian Blackwater.  All questions answered.  I spent 30 minutes with > 50% in face to face consultation  and care coordination.  Novella Olive, MD ----------------------------------------------------------------------              Lin Landsman, MD Electronically Signed Final Report   05/05/2022 05:21 pm ----------------------------------------------------------------------  [place] MFM UA CORD  DOPPLER  Result Date: 05/05/2022 ----------------------------------------------------------------------  OBSTETRICS REPORT                       (Signed Final 05/05/2022 05:21 pm) ---------------------------------------------------------------------- Patient Info  ID #:       [id number]                          D.O.B.:  [birth year] (31 yrs)  Name:       Kim Miles                Visit Date: 05/05/2022 04:33 pm ---------------------------------------------------------------------- Performed By  Attending:        Lin Landsman      Ref. Address:     [address]                    [address]                                                             [address]  Performed By:     Eden Lathe BS      [facility]  Referred By:      [facility] Mila Merry ---------------------------------------------------------------------- Orders  #  Description                           Code        Ordered By  1  [place] MFM OB FOLLOW UP                   [phone]    Rosana Hoes  2  [place] MFM FETAL BPP WO NON               76819.01    YU FANG     STRESS  3  [place] MFM UA CORD DOPPLER                76820.02    YU FANG ----------------------------------------------------------------------  #  Order #                     Accession #                Episode #  1  [id number]                   [id number]                 [id number]  2  [id number]                   [id number]  3  [id number]                   [id number]                 [id number] ---------------------------------------------------------------------- Indications  [redacted] weeks gestation of pregnancy                Z3A.31  Obesity complicating pregnancy, third          O99.213  trimester (nifedipine)  Diabetes - Pregestational,3rd trimester (diet   O24.313  control)  Hypertension - Chronic/Pre-existing            O10.019  LR-NIPS/AFP-Neg/Horizon-Neg  [redacted] weeks gestation of pregnancy                Z3A.28 ---------------------------------------------------------------------- Fetal Evaluation  Num Of Fetuses:         1  Fetal Heart Rate(bpm):  157  Cardiac Activity:       Observed  Presentation:           Cephalic  Placenta:               Anterior  P. Cord Insertion:      Visualized  Amniotic Fluid  AFI FV:      Subjectively low-normal  AFI Sum(cm)     %Tile       Largest Pocket(cm)  8.01            3.3         3.93  RUQ(cm)  LUQ(cm)        LLQ(cm)  3.93                        2.72           1.36 ---------------------------------------------------------------------- Biometry  BPD:     73.87  mm     G. Age:  29w 4d          6  %    CI:        73.33   %    70 - 86                                                          FL/HC:      18.8   %    19.3 - 21.3  HC:    274.14   mm     G. Age:  30w 0d        2.3  %    HC/AC:      1.09        0.96 - 1.17  AC:    252.34   mm     G. Age:  29w 3d          8  %    FL/BPD:     69.9   %    71 - 87  FL:      51.64  mm     G. Age:  27w 4d        < 1  %    FL/AC:      20.5   %    20 - 24  HUM:      47.6  mm     G. Age:  28w 0d        < 5  %  LV:        2.9  mm  Est. FW:    1301  gm    2 lb 14 oz     1.7  % ---------------------------------------------------------------------- OB History  Gravidity:    1         Term:   0        Prem:   0        SAB:   0  TOP:          0       Ectopic:  0        Living: 0 ---------------------------------------------------------------------- Gestational Age  LMP:           31w 1d        Date:  09/29/21                  EDD:   07/06/22  U/S Today:     29w 1d                                        EDD:   07/20/22  Best:          31w 1d     Det. By:  LMP  (09/29/21)          EDD:   07/06/22 ---------------------------------------------------------------------- Anatomy  Cranium:  Appears normal         Aortic Arch:            Previously seen  Cavum:                 Appears normal         Ductal Arch:            Previously seen  Ventricles:            Appears normal         Diaphragm:              Appears normal  Choroid Plexus:        Previously seen        Stomach:                Appears normal, left                                                                        sided  Cerebellum:            Previously seen        Abdomen:                Appears normal  Posterior Fossa:       Previously seen        Abdominal Wall:         Previously seen  Nuchal Fold:           Previously seen        Cord Vessels:           Previously seen  Face:                  Appears normal         Kidneys:                Appear normal                         (orbits and profile)  Lips:                  Previously seen        Bladder:                Appears normal  Thoracic:              Appears normal         Spine:                  Previously seen  Heart:                 Previously seen        Upper Extremities:      Previously seen  RVOT:                  Appears normal         Lower Extremities:      Previously seen  LVOT:                  Previously seen  Other:  VC, 3VV and 3VTV previously visualized. Nasal bone, lenses,  maxilla, mandible and falx previously visualized. Feet and open          hands/5th digits previously visualized. Fetus appears to be a female. ---------------------------------------------------------------------- Doppler - Fetal Vessels  Umbilical Artery   S/D     %tile      RI    %tile      PI    %tile     PSV    ADFV    RDFV                                                     (cm/s)   7.06   > 97.5    0.86   > 97.5    1.61   > 97.5    30.93     Yes      No ---------------------------------------------------------------------- Impression  MFM Brief Note  Kim Miles is a G1P0 who is seen at 59 w 1d for follow up  growth given chronic hypertension.  She is seen today at the  request of Danelle Berry, PA-C.  She is overall doing well. She denies s/sx of preeclampsia  with exception of fatigue.  Her blood pressure today was 162/113 with a repeat of  153/111 mmHg.  A follow up ultrasound was performed today.  Positive interval growth with measurements consistent with  severe fetal growth restriction with an EFW 1st%.  Good fetal movement and amniotic fluid volume  A BPP was performed and was 8/8.  The UA Dopplers are elevated with intermittent AEDF but no  evidence of AEDF was noted.  I discussed today's findings with Kim Miles. I explained that I  am concerned for superimposed preeclampsia.  We discussed the risk of placental abruption, fetal growth  restriction, stroke, stillbirth and eclampsia.  Therefore I recommend admission with preeclampsia  evaluation.  To include:  CBC, CMP, UPC  NST  Magnesium and BMZ.  IF her labs are normal we recommend discontinuing the  magnesium and titrating her blood pressure to maintain and  avg BP 135/85 mmHg.  Given FGR in the context of severe HTN we would  recommend delivery for abnormal trending labs, poor fetal  testing persistent reversed EDF or AEDF after 32 weeks.  If supper imposed preeclampsia is diagnosed inpatient  management with delivery at 34 weeks unless the UA  Dopplers become persistently AEDF.  While inpatient daily NST's, 2x weekly UA Dopplers with  BPPs.  I discussed this plan of care with Precious Gilding and Dr.  Adrian Blackwater.  All questions answered.  I spent 30 minutes with > 50% in face to face consultation  and care coordination.  Novella Olive, MD ----------------------------------------------------------------------              Lin Landsman, MD Electronically Signed Final Report   05/05/2022 05:21 pm ----------------------------------------------------------------------   Current scheduled medications  aspirin EC  162 mg Oral Daily   docusate sodium  100 mg Oral Daily   escitalopram  10 mg Oral QHS   insulin  aspart  0-14 Units Subcutaneous TID PC   labetalol  400 mg Oral TID   NIFEdipine  60 mg Oral BID   prenatal multivitamin  1 tablet Oral Q1200    I have reviewed the patient's current medications.  ASSESSMENT: Principal Problem:   Severe preeclampsia, third trimester Active Problems:   Supervision  of high risk pregnancy, antepartum   Maternal morbid obesity, antepartum (HCC)   Chronic hypertension affecting pregnancy   Gestational diabetes mellitus (GDM) affecting pregnancy, antepartum   IUGR (intrauterine growth restriction) affecting care of mother, third trimester   [redacted] weeks gestation of pregnancy   PLAN: BP improved and stable on current regimen of procardia and labbetalol- Continue to monitor closely. Patient completed magnesium sulfate for over 24 hours Labs reassuring, no evidence of HELLP.  Continue twice daily fetal monitoring Next ultrasound with doppler tomorrow Elevated CBG s/p BMZ,  patient was ordered for sensitive scale sliding scale.  Patient is aware that cesarean delivery will be indicated for any worsening fetal status, uncontrolled BP, worsening labs or other serious maternal-fetal concern. Neonatology consult done, appreciate their input. Will continue close observation and routine antenatal care   Catalina Antigua, MD, FACOG Obstetrician & Gynecologist, [facility] [employer], [facility]

## 2022-05-08 ENCOUNTER — Inpatient Hospital Stay (HOSPITAL_COMMUNITY): Payer: Managed Care, Other (non HMO)

## 2022-05-08 DIAGNOSIS — O24439 Gestational diabetes mellitus in the puerperium, unspecified control: Secondary | ICD-10-CM | POA: Diagnosis not present

## 2022-05-08 DIAGNOSIS — O10013 Pre-existing essential hypertension complicating pregnancy, third trimester: Secondary | ICD-10-CM

## 2022-05-08 DIAGNOSIS — E669 Obesity, unspecified: Secondary | ICD-10-CM | POA: Diagnosis not present

## 2022-05-08 DIAGNOSIS — O99213 Obesity complicating pregnancy, third trimester: Secondary | ICD-10-CM | POA: Diagnosis not present

## 2022-05-08 DIAGNOSIS — O1413 Severe pre-eclampsia, third trimester: Secondary | ICD-10-CM | POA: Diagnosis not present

## 2022-05-08 DIAGNOSIS — Z3A31 31 weeks gestation of pregnancy: Secondary | ICD-10-CM

## 2022-05-08 LAB — COMPREHENSIVE METABOLIC PANEL
ALT: 14 U/L (ref 0–44)
AST: 19 U/L (ref 15–41)
Albumin: 2.3 g/dL — ABNORMAL LOW (ref 3.5–5.0)
Alkaline Phosphatase: 88 U/L (ref 38–126)
Anion gap: 9 (ref 5–15)
BUN: 16 mg/dL (ref 6–20)
CO2: 20 mmol/L — ABNORMAL LOW (ref 22–32)
Calcium: 8.3 mg/dL — ABNORMAL LOW (ref 8.9–10.3)
Chloride: 105 mmol/L (ref 98–111)
Creatinine, Ser: 0.69 mg/dL (ref 0.44–1.00)
GFR, Estimated: >60 mL/min (ref >60)
Glucose, Bld: 105 mg/dL — ABNORMAL HIGH (ref 70–99)
Potassium: 4.7 mmol/L (ref 3.5–5.1)
Sodium: 134 mmol/L — ABNORMAL LOW (ref 135–145)
Total Bilirubin: 0.1 mg/dL — ABNORMAL LOW (ref 0.3–1.2)
Total Protein: 5.8 g/dL — ABNORMAL LOW (ref 6.5–8.1)

## 2022-05-08 LAB — CBC
HCT: 33.2 % — ABNORMAL LOW (ref 36.0–46.0)
Hemoglobin: 11.1 g/dL — ABNORMAL LOW (ref 12.0–15.0)
MCH: 30.2 pg (ref 26.0–34.0)
MCHC: 33.4 g/dL (ref 30.0–36.0)
MCV: 90.5 fL (ref 80.0–100.0)
Platelets: 277 10*3/uL (ref 150–400)
RBC: 3.67 MIL/uL — ABNORMAL LOW (ref 3.87–5.11)
RDW: 13.7 % (ref 11.5–15.5)
WBC: 15 10*3/uL — ABNORMAL HIGH (ref 4.0–10.5)
nRBC: 0.3 % — ABNORMAL HIGH (ref 0.0–0.2)

## 2022-05-08 LAB — TYPE AND SCREEN
ABO/RH(D): A POS
Antibody Screen: NEGATIVE

## 2022-05-08 LAB — GLUCOSE, CAPILLARY
Glucose-Capillary: 100 mg/dL — ABNORMAL HIGH (ref 70–99)
Glucose-Capillary: 119 mg/dL — ABNORMAL HIGH (ref 70–99)
Glucose-Capillary: 131 mg/dL — ABNORMAL HIGH (ref 70–99)
Glucose-Capillary: 105 mg/dL — ABNORMAL HIGH (ref 70–99)

## 2022-05-08 MED ORDER — POLYETHYLENE GLYCOL 3350 17 G PO PACK
17.0000 g | PACK | Freq: Every day | ORAL | Status: DC
Start: 2022-05-08 — End: 2022-05-21
  Administered 2022-05-08 – 2022-05-20 (×10): 17 g via ORAL
  Filled 2022-05-08 (×11): qty 1

## 2022-05-08 MED ORDER — SODIUM CHLORIDE 0.9% FLUSH
3.0000 mL | Freq: Two times a day (BID) | INTRAVENOUS | Status: DC
  Administered 2022-05-08 – 2022-05-16 (×13): 3 mL via INTRAVENOUS

## 2022-05-08 NOTE — Progress Notes (Signed)
Patient ID: Kim Miles, female   DOB: 08/25/1991, 31 y.o.   MRN: [id number] FACULTY PRACTICE ANTEPARTUM COMPREHENSIVE PROGRESS NOTE  Kim Miles is a 31 y.o. G1P0 at [redacted]w[redacted]d who is admitted for severe preeclampsia superimposed on CHTN in the setting of severe fetal growth restriction (EFW 1%), elevated UA dopplers with intermittent AEDF.  Estimated Date of Delivery: 07/06/22 Fetal presentation is cephalic.  Length of Stay:  3 Days. Admitted 05/05/2022  Subjective: Patient denies any headaches, visual symptoms, RUQ/epigastric pain or other concerning symptoms.  Patient reports good fetal movement.  She reports no uterine contractions, no bleeding and no loss of fluid per vagina.  Vitals:  Blood pressure (!) 140/93, pulse 86, temperature 98.3 F (36.8 C), temperature source Oral, resp. rate 19, height 4\' 11"  (1.499 m), weight 89.8 kg, last menstrual period 09/29/2021, SpO2 99 %. Patient Vitals for the past 24 hrs:  BP Temp Temp src Pulse Resp SpO2  05/08/22 0751 (!) 140/93 98.3 F (36.8 C) Oral 86 19 99 %  05/08/22 0328 133/80 98.1 F (36.7 C) Oral 78 18 100 %  05/07/22 2352 134/84 98.5 F (36.9 C) Oral 78 18 98 %  05/07/22 1946 (!) 146/81 97.8 F (36.6 C) Oral 85 17 99 %  05/07/22 1547 136/88 98.1 F (36.7 C) Oral 79 16 100 %  05/07/22 1158 126/84 98.1 F (36.7 C) Oral 91 16 --    Physical Examination: CONSTITUTIONAL: Well-developed, well-nourished female in no acute distress.  NEUROLOGIC: Alert and oriented to person, place, and time. No cranial nerve deficit noted. PSYCHIATRIC: Normal mood and affect. Normal behavior. Normal judgment and thought content. CARDIOVASCULAR: Normal heart rate noted, regular rhythm RESPIRATORY: Effort and breath sounds normal, no problems with respiration noted MUSCULOSKELETAL: Normal range of motion. No edema and no tenderness. 2+ distal pulses. ABDOMEN: Soft, nontender, nondistended, gravid. CERVIX:    Fetal monitoring: FHR: 140 bpm,  Variability: moderate, Accelerations: 10 x 10 Present, Decelerations: Absent >> Category I Uterine activity: No contractions   Results for orders placed or performed during the hospital encounter of 05/05/22 (from the past 48 hour(s))  Magnesium     Status: Abnormal   Collection Time: 05/06/22 10:52 AM  Result Value Ref Range   Magnesium 6.7 (HH) 1.7 - 2.4 mg/dL    Comment: CRITICAL RESULT CALLED TO, READ BACK BY AND VERIFIED WITH JENN COOKE RN.@[email or web address]  ON 7.12.23 BY TCALDWELL MT. RESULT CONFIRMED BY MANUAL DILUTION Performed at [facility], [address], [place], [address] [place]   Glucose, capillary     Status: Abnormal   Collection Time: 05/06/22 11:41 AM  Result Value Ref Range   Glucose-Capillary 130 (H) 70 - 99 mg/dL    Comment: Glucose reference range applies only to samples taken after fasting for at least 8 hours.  Glucose, capillary     Status: Abnormal   Collection Time: 05/06/22  3:15 PM  Result Value Ref Range   Glucose-Capillary 131 (H) 70 - 99 mg/dL    Comment: Glucose reference range applies only to samples taken after fasting for at least 8 hours.  Glucose, capillary     Status: Abnormal   Collection Time: 05/06/22  8:55 PM  Result Value Ref Range   Glucose-Capillary 207 (H) 70 - 99 mg/dL    Comment: Glucose reference range applies only to samples taken after fasting for at least 8 hours.   Comment 1 Notify RN   Glucose, capillary     Status: Abnormal   Collection  Time: 05/06/22 10:12 PM  Result Value Ref Range   Glucose-Capillary 147 (H) 70 - 99 mg/dL    Comment: Glucose reference range applies only to samples taken after fasting for at least 8 hours.   Comment 1 Notify RN   CBC     Status: Abnormal   Collection Time: 05/07/22  5:15 AM  Result Value Ref Range   WBC 16.3 (H) 4.0 - 10.5 K/uL   RBC 3.86 (L) 3.87 - 5.11 MIL/uL   Hemoglobin 11.6 (L) 12.0 - 15.0 g/dL   HCT 23.5 (L) 57.3 - 22.0 %   MCV 92.0 80.0 - 100.0 fL   MCH 30.1 26.0 - 34.0 pg   MCHC  32.7 30.0 - 36.0 g/dL   RDW 25.4 27.0 - 62.3 %   Platelets 295 150 - 400 K/uL   nRBC 0.1 0.0 - 0.2 %    Comment: Performed at [facility], [address], [place], [place] [address]  Comprehensive metabolic panel     Status: Abnormal   Collection Time: 05/07/22  5:15 AM  Result Value Ref Range   Sodium 135 135 - 145 mmol/L   Potassium 4.3 3.5 - 5.1 mmol/L   Chloride 107 98 - 111 mmol/L   CO2 20 (L) 22 - 32 mmol/L   Glucose, Bld 146 (H) 70 - 99 mg/dL    Comment: Glucose reference range applies only to samples taken after fasting for at least 8 hours.   BUN 14 6 - 20 mg/dL   Creatinine, Ser 1.51 0.44 - 1.00 mg/dL   Calcium 7.9 (L) 8.9 - 10.3 mg/dL   Total Protein 6.3 (L) 6.5 - 8.1 g/dL   Albumin 2.5 (L) 3.5 - 5.0 g/dL   AST 21 15 - 41 U/L   ALT 15 0 - 44 U/L   Alkaline Phosphatase 101 38 - 126 U/L   Total Bilirubin 0.2 (L) 0.3 - 1.2 mg/dL   GFR, Estimated >76 >16 mL/min    Comment: (NOTE) Calculated using the CKD-EPI Creatinine Equation (2021)    Anion gap 8 5 - 15    Comment: Performed at [facility], [address], [place], [place] [address]  Glucose, capillary     Status: Abnormal   Collection Time: 05/07/22  6:04 AM  Result Value Ref Range   Glucose-Capillary 135 (H) 70 - 99 mg/dL    Comment: Glucose reference range applies only to samples taken after fasting for at least 8 hours.   Comment 1 Notify RN   Glucose, capillary     Status: Abnormal   Collection Time: 05/07/22  9:54 AM  Result Value Ref Range   Glucose-Capillary 177 (H) 70 - 99 mg/dL    Comment: Glucose reference range applies only to samples taken after fasting for at least 8 hours.  Glucose, capillary     Status: Abnormal   Collection Time: 05/07/22  1:31 PM  Result Value Ref Range   Glucose-Capillary 189 (H) 70 - 99 mg/dL    Comment: Glucose reference range applies only to samples taken after fasting for at least 8 hours.  Glucose, capillary     Status: Abnormal   Collection Time: 05/07/22   8:45 PM  Result Value Ref Range   Glucose-Capillary 177 (H) 70 - 99 mg/dL    Comment: Glucose reference range applies only to samples taken after fasting for at least 8 hours.  Glucose, capillary     Status: Abnormal   Collection Time: 05/07/22 10:48  PM  Result Value Ref Range   Glucose-Capillary 127 (H) 70 - 99 mg/dL    Comment: Glucose reference range applies only to samples taken after fasting for at least 8 hours.  CBC     Status: Abnormal   Collection Time: 05/08/22  4:37 AM  Result Value Ref Range   WBC 15.0 (H) 4.0 - 10.5 K/uL   RBC 3.67 (L) 3.87 - 5.11 MIL/uL   Hemoglobin 11.1 (L) 12.0 - 15.0 g/dL   HCT 80.9 (L) 98.3 - 38.2 %   MCV 90.5 80.0 - 100.0 fL   MCH 30.2 26.0 - 34.0 pg   MCHC 33.4 30.0 - 36.0 g/dL   RDW 50.5 39.7 - 67.3 %   Platelets 277 150 - 400 K/uL   nRBC 0.3 (H) 0.0 - 0.2 %    Comment: Performed at [facility], [address] [place], [place] [address]  Comprehensive metabolic panel     Status: Abnormal   Collection Time: 05/08/22  4:37 AM  Result Value Ref Range   Sodium 134 (L) 135 - 145 mmol/L   Potassium 4.7 3.5 - 5.1 mmol/L   Chloride 105 98 - 111 mmol/L   CO2 20 (L) 22 - 32 mmol/L   Glucose, Bld 105 (H) 70 - 99 mg/dL    Comment: Glucose reference range applies only to samples taken after fasting for at least 8 hours.   BUN 16 6 - 20 mg/dL   Creatinine, Ser 9.02 0.44 - 1.00 mg/dL   Calcium 8.3 (L) 8.9 - 10.3 mg/dL   Total Protein 5.8 (L) 6.5 - 8.1 g/dL   Albumin 2.3 (L) 3.5 - 5.0 g/dL   AST 19 15 - 41 U/L   ALT 14 0 - 44 U/L   Alkaline Phosphatase 88 38 - 126 U/L   Total Bilirubin 0.1 (L) 0.3 - 1.2 mg/dL   GFR, Estimated >40 >97 mL/min    Comment: (NOTE) Calculated using the CKD-EPI Creatinine Equation (2021)    Anion gap 9 5 - 15    Comment: Performed at [facility], [address] [place], [place] [address]  Glucose, capillary     Status: Abnormal   Collection Time: 05/08/22  6:20 AM  Result Value Ref Range    Glucose-Capillary 100 (H) 70 - 99 mg/dL    Comment: Glucose reference range applies only to samples taken after fasting for at least 8 hours.  Glucose, capillary     Status: Abnormal   Collection Time: 05/08/22  9:46 AM  Result Value Ref Range   Glucose-Capillary 119 (H) 70 - 99 mg/dL    Comment: Glucose reference range applies only to samples taken after fasting for at least 8 hours.    No results found.  Current scheduled medications  aspirin EC  162 mg Oral Daily   docusate sodium  100 mg Oral Daily   escitalopram  10 mg Oral QHS   insulin aspart  0-14 Units Subcutaneous TID PC   labetalol  400 mg Oral TID   NIFEdipine  60 mg Oral BID   prenatal multivitamin  1 tablet Oral Q1200    I have reviewed the patient's current medications.  ASSESSMENT: Principal Problem:   Severe preeclampsia, third trimester Active Problems:   Supervision of high risk pregnancy, antepartum   Maternal morbid obesity, antepartum (HCC)   Chronic hypertension affecting pregnancy   Gestational diabetes mellitus (GDM) affecting pregnancy, antepartum   IUGR (intrauterine growth restriction) affecting care of  mother, third trimester   [redacted] weeks gestation of pregnancy   PLAN: BP stable on current regimen of procardia and labbetalol- Continue to monitor closely. Patient completed magnesium sulfate for over 24 hours Labs remain normal, no evidence of HELLP.  Continue twice daily fetal monitoring Follow up UA dopplers today Elevated CBG s/p BMZ,  patient was ordered for sensitive scale sliding scale.  Patient is aware that cesarean delivery will be indicated for any worsening fetal status, uncontrolled BP, worsening labs or other serious maternal-fetal concern. Neonatology consult done, appreciate their input. Will continue close observation and routine antenatal care   Catalina Antigua, MD, FACOG Obstetrician & Gynecologist, [facility] [employer], [facility]

## 2022-05-09 DIAGNOSIS — Z3A31 31 weeks gestation of pregnancy: Secondary | ICD-10-CM

## 2022-05-09 DIAGNOSIS — O1413 Severe pre-eclampsia, third trimester: Secondary | ICD-10-CM | POA: Diagnosis not present

## 2022-05-09 LAB — GLUCOSE, CAPILLARY
Glucose-Capillary: 89 mg/dL (ref 70–99)
Glucose-Capillary: 80 mg/dL (ref 70–99)
Glucose-Capillary: 102 mg/dL — ABNORMAL HIGH (ref 70–99)
Glucose-Capillary: 86 mg/dL (ref 70–99)
Glucose-Capillary: 98 mg/dL (ref 70–99)
Glucose-Capillary: 100 mg/dL — ABNORMAL HIGH (ref 70–99)
Glucose-Capillary: 113 mg/dL — ABNORMAL HIGH (ref 70–99)

## 2022-05-09 LAB — COMPREHENSIVE METABOLIC PANEL
ALT: 14 U/L (ref 0–44)
ALT: 14 U/L (ref 0–44)
AST: 19 U/L (ref 15–41)
AST: 18 U/L (ref 15–41)
Albumin: 2.5 g/dL — ABNORMAL LOW (ref 3.5–5.0)
Albumin: 2.4 g/dL — ABNORMAL LOW (ref 3.5–5.0)
Alkaline Phosphatase: 86 U/L (ref 38–126)
Alkaline Phosphatase: 98 U/L (ref 38–126)
Anion gap: 10 (ref 5–15)
Anion gap: 11 (ref 5–15)
BUN: 14 mg/dL (ref 6–20)
BUN: 17 mg/dL (ref 6–20)
CO2: 20 mmol/L — ABNORMAL LOW (ref 22–32)
CO2: 21 mmol/L — ABNORMAL LOW (ref 22–32)
Calcium: 8.2 mg/dL — ABNORMAL LOW (ref 8.9–10.3)
Calcium: 9.3 mg/dL (ref 8.9–10.3)
Chloride: 105 mmol/L (ref 98–111)
Chloride: 103 mmol/L (ref 98–111)
Creatinine, Ser: 0.67 mg/dL (ref 0.44–1.00)
Creatinine, Ser: 0.8 mg/dL (ref 0.44–1.00)
GFR, Estimated: >60 mL/min (ref >60)
GFR, Estimated: >60 mL/min (ref >60)
Glucose, Bld: 86 mg/dL (ref 70–99)
Glucose, Bld: 90 mg/dL (ref 70–99)
Potassium: 4.3 mmol/L (ref 3.5–5.1)
Potassium: 4.3 mmol/L (ref 3.5–5.1)
Sodium: 135 mmol/L (ref 135–145)
Sodium: 135 mmol/L (ref 135–145)
Total Bilirubin: 0.4 mg/dL (ref 0.3–1.2)
Total Bilirubin: 0.3 mg/dL (ref 0.3–1.2)
Total Protein: 5.9 g/dL — ABNORMAL LOW (ref 6.5–8.1)
Total Protein: 6.2 g/dL — ABNORMAL LOW (ref 6.5–8.1)

## 2022-05-09 LAB — CBC
HCT: 35.1 % — ABNORMAL LOW (ref 36.0–46.0)
HCT: 35.9 % — ABNORMAL LOW (ref 36.0–46.0)
Hemoglobin: 11.7 g/dL — ABNORMAL LOW (ref 12.0–15.0)
Hemoglobin: 11.9 g/dL — ABNORMAL LOW (ref 12.0–15.0)
MCH: 30.5 pg (ref 26.0–34.0)
MCH: 30.1 pg (ref 26.0–34.0)
MCHC: 33.3 g/dL (ref 30.0–36.0)
MCHC: 33.1 g/dL (ref 30.0–36.0)
MCV: 91.4 fL (ref 80.0–100.0)
MCV: 90.7 fL (ref 80.0–100.0)
Platelets: 270 10*3/uL (ref 150–400)
Platelets: 308 10*3/uL (ref 150–400)
RBC: 3.84 MIL/uL — ABNORMAL LOW (ref 3.87–5.11)
RBC: 3.96 MIL/uL (ref 3.87–5.11)
RDW: 13.6 % (ref 11.5–15.5)
RDW: 13.3 % (ref 11.5–15.5)
WBC: 15 10*3/uL — ABNORMAL HIGH (ref 4.0–10.5)
WBC: 15.9 10*3/uL — ABNORMAL HIGH (ref 4.0–10.5)
nRBC: 0.1 % (ref 0.0–0.2)
nRBC: 0.1 % (ref 0.0–0.2)

## 2022-05-09 MED ORDER — FAMOTIDINE 20 MG PO TABS
20.0000 mg | ORAL_TABLET | Freq: Two times a day (BID) | ORAL | Status: DC | PRN
  Administered 2022-05-09: 20 mg via ORAL
  Filled 2022-05-09: qty 1

## 2022-05-09 NOTE — Progress Notes (Signed)
Patient ID: Kim Miles, female   DOB: 03-Jul-1991, 31 y.o.   MRN: [id number] FACULTY PRACTICE ANTEPARTUM(COMPREHENSIVE) NOTE  Kim Miles is a 31 y.o. G1P0 with Estimated Date of Delivery: 07/06/22   By  early ultrasound [redacted]w[redacted]d  who is admitted for . for severe preeclampsia superimposed on CHTN in the setting of severe fetal growth restriction (EFW 1%), elevated UA dopplers with intermittent AEDF.  Estimated Date of Delivery: 07/06/22 Fetal presentation is cephalic.    Fetal presentation is cephalic. Length of Stay:  4  Days  Date of admission:05/05/2022  Subjective: No complaints Patient reports the fetal movement as active. Patient reports uterine contraction  activity as none. Patient reports  vaginal bleeding as none. Patient describes fluid per vagina as None.  Vitals:  Blood pressure 122/77, pulse 78, temperature 98 F (36.7 C), temperature source Oral, resp. rate 16, height 4\' 11"  (1.499 m), weight 89.8 kg, last menstrual period 09/29/2021, SpO2 100 %. Vitals:   05/08/22 1646 05/08/22 2012 05/08/22 2307 05/09/22 0350  BP: (!) 129/93 121/78 126/82 122/77  Pulse: 84 77 78 78  Resp: 17 18 16 16   Temp: 97.6 F (36.4 C) 98.9 F (37.2 C) 98.4 F (36.9 C) 98 F (36.7 C)  TempSrc: Oral Oral Oral Oral  SpO2: 99% 99% 99% 100%  Weight:      Height:       Physical Examination:  General appearance - alert, well appearing, and in no distress Abdomen - soft, nontender, nondistended, no masses or organomegaly Fundal Height:  size equals dates Pelvic Exam:  examination not indicated Cervical Exam: Not evaluated.  Extremities: extremities normal, atraumatic, no cyanosis or edema with DTRs 2+ bilaterally Membranes:intact  Fetal Monitoring:  Baseline: 140s bpm, Variability: Good {> 6 bpm), Accelerations: Reactive, and Decelerations: Absent   reactive  Labs:  Results for orders placed or performed during the hospital encounter of 05/05/22 (from the past 24 hour(s))  Glucose,  capillary   Collection Time: 05/08/22  9:46 AM  Result Value Ref Range   Glucose-Capillary 119 (H) 70 - 99 mg/dL  Glucose, capillary   Collection Time: 05/08/22  6:53 PM  Result Value Ref Range   Glucose-Capillary 131 (H) 70 - 99 mg/dL  Type and screen [facility]   Collection Time: 05/08/22  7:01 PM  Result Value Ref Range   ABO/RH(D) A POS    Antibody Screen NEG    Sample Expiration      05/11/2022,2359 Performed at [facility], [address], [place], [address] [place]   Glucose, capillary   Collection Time: 05/08/22 10:13 PM  Result Value Ref Range   Glucose-Capillary 105 (H) 70 - 99 mg/dL  CBC   Collection Time: 05/09/22  5:18 AM  Result Value Ref Range   WBC 15.0 (H) 4.0 - 10.5 K/uL   RBC 3.84 (L) 3.87 - 5.11 MIL/uL   Hemoglobin 11.7 (L) 12.0 - 15.0 g/dL   HCT 05/10/22 (L) 05/11/22 - 37.9 %   MCV 91.4 80.0 - 100.0 fL   MCH 30.5 26.0 - 34.0 pg   MCHC 33.3 30.0 - 36.0 g/dL   RDW 02.4 09.7 - 35.3 %   Platelets 270 150 - 400 K/uL   nRBC 0.1 0.0 - 0.2 %  Comprehensive metabolic panel   Collection Time: 05/09/22  5:18 AM  Result Value Ref Range   Sodium 135 135 - 145 mmol/L   Potassium 4.3 3.5 - 5.1 mmol/L   Chloride 105 98 -  111 mmol/L   CO2 20 (L) 22 - 32 mmol/L   Glucose, Bld 86 70 - 99 mg/dL   BUN 14 6 - 20 mg/dL   Creatinine, Ser 1.61 0.44 - 1.00 mg/dL   Calcium 8.2 (L) 8.9 - 10.3 mg/dL   Total Protein 5.9 (L) 6.5 - 8.1 g/dL   Albumin 2.5 (L) 3.5 - 5.0 g/dL   AST 19 15 - 41 U/L   ALT 14 0 - 44 U/L   Alkaline Phosphatase 86 38 - 126 U/L   Total Bilirubin 0.4 0.3 - 1.2 mg/dL   GFR, Estimated >09 >60 mL/min   Anion gap 10 5 - 15  Glucose, capillary   Collection Time: 05/09/22  6:27 AM  Result Value Ref Range   Glucose-Capillary 89 70 - 99 mg/dL    Imaging Studies:    [place] MFM UA Cord Doppler  Result Date: 05/08/2022 ----------------------------------------------------------------------  OBSTETRICS REPORT                       (Signed  Final 05/08/2022 03:17 pm) ---------------------------------------------------------------------- Patient Info  ID #:       [id number]                          D.O.B.:  1991-06-06 (31 yrs)  Name:       Kim Miles              Visit Date: 05/08/2022 01:26 pm ---------------------------------------------------------------------- Performed By  Attending:        Lin Landsman      Ref. Address:     [address]                    [place]                                                             [address]  Performed By:     Isac Sarna        [facility]  Referred By:      [facility] Kim Miles ---------------------------------------------------------------------- Orders  #  Description                           Code        Ordered By  1  [place] MFM UA CORD DOPPLER                76820.02    PEGGY CONSTANT  2  [place] MFM FETAL BPP WO NON               14782.95    PEGGY CONSTANT     STRESS ----------------------------------------------------------------------  #  Order #                     Accession #  Episode #  1  [id number]                   [id number]                 [id number]  2  [id number]                   [id number]                 [id number] ---------------------------------------------------------------------- Indications  Obesity complicating pregnancy, third          O99.213  trimester (nifedipine)  Diabetes - Pregestational,3rd trimester (diet  O24.313  control)  Hypertension - Chronic/Pre-existing            O10.019  [redacted] weeks gestation of pregnancy                Z3A.31  LR-NIPS/AFP-Neg/Horizon-Neg ---------------------------------------------------------------------- Fetal Evaluation  Num Of Fetuses:         1  Fetal Heart Rate(bpm):  145  Cardiac Activity:       Observed  Presentation:           Cephalic  Placenta:               Anterior  P. Cord Insertion:      Previously Visualized  Amniotic Fluid  AFI  FV:      Within normal limits  AFI Sum(cm)     %Tile       Largest Pocket(cm)  14.96           53          5.31  RUQ(cm)       RLQ(cm)       LUQ(cm)        LLQ(cm)  3.31          3.58          5.31           2.76 ---------------------------------------------------------------------- Biophysical Evaluation  Amniotic F.V:   Within normal limits       F. Tone:        Observed  F. Movement:    Observed                   Score:          8/8  F. Breathing:   Observed ---------------------------------------------------------------------- Biometry  LV:        4.3  mm ---------------------------------------------------------------------- OB History  Gravidity:    1         Term:   0        Prem:   0        SAB:   0  TOP:          0       Ectopic:  0        Living: 0 ---------------------------------------------------------------------- Gestational Age  LMP:           31w 4d        Date:  09/29/21                  EDD:   07/06/22  Best:          31w 4d     Det. By:  LMP  (09/29/21)          EDD:   07/06/22 ---------------------------------------------------------------------- Anatomy  Ventricles:            Appears normal  Kidneys:                Appear normal  Diaphragm:             Appears normal         Bladder:                Appears normal  Stomach:               Appears normal, left                         sided ---------------------------------------------------------------------- Doppler - Fetal Vessels  Umbilical Artery   S/D     %tile      RI    %tile      PI    %tile     PSV    ADFV    RDFV                                                     (cm/s)   5.76   > 97.5    0.83   > 97.5    1.52   > 97.5    28.16      No      No ---------------------------------------------------------------------- Cervix Uterus Adnexa  Cervix  Not visualized (advanced GA >24wks)  Uterus  No abnormality visualized.  Right Ovary  Not visualized.  Left Ovary  Not visualized.  Cul De Sac  No free fluid seen.  Adnexa  No abnormality  visualized. ---------------------------------------------------------------------- Impression  Antenatal testing due to severe FGR  Biophysical profile 8/8 with good fetal movement and  amniotic fluid volume  UA Dopplers are elevated with no evidence of AEDF or  REDF. ---------------------------------------------------------------------- Recommendations  Continue 2x weekly  UA/BPP with daily NST. ----------------------------------------------------------------------              Lin Landsman, MD Electronically Signed Final Report   05/08/2022 03:17 pm ----------------------------------------------------------------------  [place] MFM FETAL BPP WO NON STRESS  Result Date: 05/08/2022 ----------------------------------------------------------------------  OBSTETRICS REPORT                       (Signed Final 05/08/2022 03:17 pm) ---------------------------------------------------------------------- Patient Info  ID #:       [id number]                          D.O.B.:  Nov 21, 1990 (31 yrs)  Name:       Kim Dec PASCHAL [place]              Visit Date: 05/08/2022 01:26 pm ---------------------------------------------------------------------- Performed By  Attending:        Lin Landsman      Ref. Address:     [address]                    [address]                                                             [address]  Performed By:     Isac Sarna  [facility]  Referred By:      [facility] ---------------------------------------------------------------------- Orders  #  Description                           Code        Ordered By  1  [place] MFM UA CORD DOPPLER                [id number]    PEGGY CONSTANT  2  [place] MFM FETAL BPP WO NON               [id number]    PEGGY CONSTANT     STRESS ----------------------------------------------------------------------  #  Order #                     Accession #                Episode #  1   [id number]                   [id number]                 [id number]  2  [id number]                   [id number]                 [id number] ---------------------------------------------------------------------- Indications  Obesity complicating pregnancy, third          O99.213  trimester (nifedipine)  Diabetes - Pregestational,3rd trimester (diet  O24.313  control)  Hypertension - Chronic/Pre-existing            O10.019  [redacted] weeks gestation of pregnancy                Z3A.31  LR-NIPS/AFP-Neg/Horizon-Neg ---------------------------------------------------------------------- Fetal Evaluation  Num Of Fetuses:         1  Fetal Heart Rate(bpm):  145  Cardiac Activity:       Observed  Presentation:           Cephalic  Placenta:               Anterior  P. Cord Insertion:      Previously Visualized  Amniotic Fluid  AFI FV:      Within normal limits  AFI Sum(cm)     %Tile       Largest Pocket(cm)  14.96           53          5.31  RUQ(cm)       RLQ(cm)       LUQ(cm)        LLQ(cm)  3.31          3.58          5.31           2.76 ---------------------------------------------------------------------- Biophysical Evaluation  Amniotic F.V:   Within normal limits       F. Tone:        Observed  F. Movement:    Observed  Score:          8/8  F. Breathing:   Observed ---------------------------------------------------------------------- Biometry  LV:        4.3  mm ---------------------------------------------------------------------- OB History  Gravidity:    1         Term:   0        Prem:   0        SAB:   0  TOP:          0       Ectopic:  0        Living: 0 ---------------------------------------------------------------------- Gestational Age  LMP:           31w 4d        Date:  09/29/21                  EDD:   07/06/22  Best:          31w 4d     Det. By:  LMP  (09/29/21)          EDD:   07/06/22 ---------------------------------------------------------------------- Anatomy  Ventricles:            Appears normal          Kidneys:                Appear normal  Diaphragm:             Appears normal         Bladder:                Appears normal  Stomach:               Appears normal, left                         sided ---------------------------------------------------------------------- Doppler - Fetal Vessels  Umbilical Artery   S/D     %tile      RI    %tile      PI    %tile     PSV    ADFV    RDFV                                                     (cm/s)   5.76   > 97.5    0.83   > 97.5    1.52   > 97.5    28.16      No      No ---------------------------------------------------------------------- Cervix Uterus Adnexa  Cervix  Not visualized (advanced GA >24wks)  Uterus  No abnormality visualized.  Right Ovary  Not visualized.  Left Ovary  Not visualized.  Cul De Sac  No free fluid seen.  Adnexa  No abnormality visualized. ---------------------------------------------------------------------- Impression  Antenatal testing due to severe FGR  Biophysical profile 8/8 with good fetal movement and  amniotic fluid volume  UA Dopplers are elevated with no evidence of AEDF or  REDF. ---------------------------------------------------------------------- Recommendations  Continue 2x weekly  UA/BPP with daily NST. ----------------------------------------------------------------------              Lin Landsman, MD Electronically Signed Final Report   05/08/2022 03:17 pm ----------------------------------------------------------------------  [place] MFM OB FOLLOW UP  Result Date: 05/05/2022 ----------------------------------------------------------------------  OBSTETRICS REPORT                       (  Signed Final 05/05/2022 05:21 pm) ---------------------------------------------------------------------- Patient Info  ID #:       [id number]                          D.O.B.:  05/22/1991 (31 yrs)  Name:       Kim Miles                Visit Date: 05/05/2022 04:33 pm ----------------------------------------------------------------------  Performed By  Attending:        Lin Landsman      Ref. Address:     [address]                    [address]                                                             [address]  Performed By:     Eden Lathe BS      [facility]  Referred By:      [facility] ---------------------------------------------------------------------- Orders  #  Description                           Code        Ordered By  1  [place] MFM OB FOLLOW UP                   [phone]    YU FANG  2  [place] MFM FETAL BPP WO NON               76819.01    YU FANG     STRESS  3  [place] MFM UA CORD DOPPLER                14782.95    YU FANG ----------------------------------------------------------------------  #  Order #                     Accession #                Episode #  1  [id number]  [id number]                 [id number]  2  [id number]                   [id number]                 [id number]  3  [id number]                   [id number]                 [id number] ---------------------------------------------------------------------- Indications  [redacted] weeks gestation of pregnancy                Z3A.31  Obesity complicating pregnancy, third          O99.213  trimester (nifedipine)  Diabetes - Pregestational,3rd trimester (diet  O24.313  control)  Hypertension - Chronic/Pre-existing            O10.019  LR-NIPS/AFP-Neg/Horizon-Neg  [redacted] weeks gestation of pregnancy                Z3A.28 ---------------------------------------------------------------------- Fetal Evaluation  Num Of Fetuses:         1  Fetal Heart Rate(bpm):  157  Cardiac Activity:       Observed  Presentation:           Cephalic  Placenta:               Anterior  P. Cord Insertion:      Visualized  Amniotic Fluid  AFI FV:      Subjectively  low-normal  AFI Sum(cm)     %Tile       Largest Pocket(cm)  8.01            3.3         3.93  RUQ(cm)                     LUQ(cm)        LLQ(cm)  3.93                        2.72           1.36 ---------------------------------------------------------------------- Biometry  BPD:     73.87  mm     G. Age:  29w 4d          6  %    CI:        73.33   %    70 - 86                                                          FL/HC:      18.8   %    19.3 - 21.3  HC:    274.14   mm     G. Age:  30w 0d        2.3  %    HC/AC:      1.09        0.96 - 1.17  AC:    252.34   mm     G. Age:  29w 3d          8  %    FL/BPD:     69.9   %  71 - 87  FL:      51.64  mm     G. Age:  27w 4d        < 1  %    FL/AC:      20.5   %    20 - 24  HUM:      47.6  mm     G. Age:  28w 0d        < 5  %  LV:        2.9  mm  Est. FW:    1301  gm    2 lb 14 oz     1.7  % ---------------------------------------------------------------------- OB History  Gravidity:    1         Term:   0        Prem:   0        SAB:   0  TOP:          0       Ectopic:  0        Living: 0 ---------------------------------------------------------------------- Gestational Age  LMP:           31w 1d        Date:  09/29/21                  EDD:   07/06/22  U/S Today:     29w 1d                                        EDD:   07/20/22  Best:          31w 1d     Det. By:  LMP  (09/29/21)          EDD:   07/06/22 ---------------------------------------------------------------------- Anatomy  Cranium:               Appears normal         Aortic Arch:            Previously seen  Cavum:                 Appears normal         Ductal Arch:            Previously seen  Ventricles:            Appears normal         Diaphragm:              Appears normal  Choroid Plexus:        Previously seen        Stomach:                Appears normal, left                                                                        sided  Cerebellum:            Previously seen        Abdomen:  Appears normal  Posterior Fossa:       Previously seen        Abdominal Wall:         Previously seen  Nuchal Fold:           Previously seen        Cord Vessels:           Previously seen  Face:                  Appears normal         Kidneys:                Appear normal                         (orbits and profile)  Lips:                  Previously seen        Bladder:                Appears normal  Thoracic:              Appears normal         Spine:                  Previously seen  Heart:                 Previously seen        Upper Extremities:      Previously seen  RVOT:                  Appears normal         Lower Extremities:      Previously seen  LVOT:                  Previously seen  Other:  VC, 3VV and 3VTV previously visualized. Nasal bone, lenses,          maxilla, mandible and falx previously visualized. Feet and open          hands/5th digits previously visualized. Fetus appears to be a female. ---------------------------------------------------------------------- Doppler - Fetal Vessels  Umbilical Artery   S/D     %tile      RI    %tile      PI    %tile     PSV    ADFV    RDFV                                                     (cm/s)   7.06   > 97.5    0.86   > 97.5    1.61   > 97.5    30.93     Yes      No ---------------------------------------------------------------------- Impression  MFM Brief Note  Kim Miles is a G1P0 who is seen at 81 w 1d for follow up  growth given chronic hypertension.  She is seen today at the request of Danelle Berry, PA-C.  She is overall doing well. She denies s/sx of preeclampsia  with exception of fatigue.  Her blood pressure today was 162/113 with a repeat of  153/111 mmHg.  A follow up ultrasound was performed today.  Positive interval growth with measurements  consistent with  severe fetal growth restriction with an EFW 1st%.  Good fetal movement and amniotic fluid volume  A BPP was performed and was 8/8.  The UA Dopplers are elevated with intermittent AEDF but no   evidence of AEDF was noted.  I discussed today's findings with Ms. Dutch Quint. I explained that I  am concerned for superimposed preeclampsia.  We discussed the risk of placental abruption, fetal growth  restriction, stroke, stillbirth and eclampsia.  Therefore I recommend admission with preeclampsia  evaluation.  To include:  CBC, CMP, UPC  NST  Magnesium and BMZ.  IF her labs are normal we recommend discontinuing the  magnesium and titrating her blood pressure to maintain and  avg BP 135/85 mmHg.  Given FGR in the context of severe HTN we would  recommend delivery for abnormal trending labs, poor fetal  testing persistent reversed EDF or AEDF after 32 weeks.  If supper imposed preeclampsia is diagnosed inpatient  management with delivery at 34 weeks unless the UA  Dopplers become persistently AEDF.  While inpatient daily NST's, 2x weekly UA Dopplers with  BPPs.  I discussed this plan of care with Precious Gilding and Dr.  Adrian Blackwater.  All questions answered.  I spent 30 minutes with > 50% in face to face consultation  and care coordination.  Novella Olive, MD ----------------------------------------------------------------------              Lin Landsman, MD Electronically Signed Final Report   05/05/2022 05:21 pm ----------------------------------------------------------------------  [place] MFM FETAL BPP WO NON STRESS  Result Date: 05/05/2022 ----------------------------------------------------------------------  OBSTETRICS REPORT                       (Signed Final 05/05/2022 05:21 pm) ---------------------------------------------------------------------- Patient Info  ID #:       [id number]                          D.O.B.:  [birth year] (31 yrs)  Name:       Kim Dec [facility]                Visit Date: 05/05/2022 04:33 pm ---------------------------------------------------------------------- Performed By  Attending:        Lin Landsman      Ref. Address:     [address]                    [address]                                                              [address]  Performed By:     Eden Lathe BS      [facility]  Referred By:      [facility] Kim Miles ---------------------------------------------------------------------- Orders  #  Description                           Code        Ordered By  1  [place] MFM OB FOLLOW UP                   [phone]    Rosana Hoes  2  [place] MFM FETAL BPP WO NON               76819.01    YU FANG     STRESS  3  [place] MFM UA CORD DOPPLER                76820.02    YU FANG ----------------------------------------------------------------------  #  Order #                     Accession #                Episode #  1  [id number]                   [id number]                 [id number]  2  [id number]                   [id number]                 [id number]  3  [id number]                   [id number]                 [id number] ---------------------------------------------------------------------- Indications  [redacted] weeks gestation of pregnancy                Z3A.31  Obesity complicating pregnancy, third          O99.213  trimester (nifedipine)  Diabetes - Pregestational,3rd trimester (diet  O24.313  control)  Hypertension - Chronic/Pre-existing            O10.019  LR-NIPS/AFP-Neg/Horizon-Neg  [redacted] weeks gestation of pregnancy                Z3A.28 ---------------------------------------------------------------------- Fetal Evaluation  Num Of Fetuses:         1  Fetal Heart Rate(bpm):  157  Cardiac Activity:       Observed  Presentation:           Cephalic  Placenta:               Anterior  P. Cord Insertion:      Visualized  Amniotic Fluid  AFI FV:      Subjectively low-normal  AFI Sum(cm)     %Tile       Largest Pocket(cm)  8.01            3.3         3.93  RUQ(cm)  LUQ(cm)         LLQ(cm)  3.93                        2.72           1.36 ---------------------------------------------------------------------- Biometry  BPD:     73.87  mm     G. Age:  29w 4d          6  %    CI:        73.33   %    70 - 86                                                          FL/HC:      18.8   %    19.3 - 21.3  HC:    274.14   mm     G. Age:  30w 0d        2.3  %    HC/AC:      1.09        0.96 - 1.17  AC:    252.34   mm     G. Age:  29w 3d          8  %    FL/BPD:     69.9   %    71 - 87  FL:      51.64  mm     G. Age:  27w 4d        < 1  %    FL/AC:      20.5   %    20 - 24  HUM:      47.6  mm     G. Age:  28w 0d        < 5  %  LV:        2.9  mm  Est. FW:    1301  gm    2 lb 14 oz     1.7  % ---------------------------------------------------------------------- OB History  Gravidity:    1         Term:   0        Prem:   0        SAB:   0  TOP:          0       Ectopic:  0        Living: 0 ---------------------------------------------------------------------- Gestational Age  LMP:           31w 1d        Date:  09/29/21                  EDD:   07/06/22  U/S Today:     29w 1d                                        EDD:   07/20/22  Best:          31w 1d     Det. By:  LMP  (09/29/21)          EDD:   07/06/22 ---------------------------------------------------------------------- Anatomy  Cranium:  Appears normal         Aortic Arch:            Previously seen  Cavum:                 Appears normal         Ductal Arch:            Previously seen  Ventricles:            Appears normal         Diaphragm:              Appears normal  Choroid Plexus:        Previously seen        Stomach:                Appears normal, left                                                                        sided  Cerebellum:            Previously seen        Abdomen:                Appears normal  Posterior Fossa:       Previously seen        Abdominal Wall:         Previously seen  Nuchal Fold:            Previously seen        Cord Vessels:           Previously seen  Face:                  Appears normal         Kidneys:                Appear normal                         (orbits and profile)  Lips:                  Previously seen        Bladder:                Appears normal  Thoracic:              Appears normal         Spine:                  Previously seen  Heart:                 Previously seen        Upper Extremities:      Previously seen  RVOT:                  Appears normal         Lower Extremities:      Previously seen  LVOT:                  Previously seen  Other:  VC, 3VV and 3VTV previously visualized. Nasal bone, lenses,  maxilla, mandible and falx previously visualized. Feet and open          hands/5th digits previously visualized. Fetus appears to be a female. ---------------------------------------------------------------------- Doppler - Fetal Vessels  Umbilical Artery   S/D     %tile      RI    %tile      PI    %tile     PSV    ADFV    RDFV                                                     (cm/s)   7.06   > 97.5    0.86   > 97.5    1.61   > 97.5    30.93     Yes      No ---------------------------------------------------------------------- Impression  MFM Brief Note  Kim Miles is a G1P0 who is seen at 21 w 1d for follow up  growth given chronic hypertension.  She is seen today at the request of Danelle Berry, PA-C.  She is overall doing well. She denies s/sx of preeclampsia  with exception of fatigue.  Her blood pressure today was 162/113 with a repeat of  153/111 mmHg.  A follow up ultrasound was performed today.  Positive interval growth with measurements consistent with  severe fetal growth restriction with an EFW 1st%.  Good fetal movement and amniotic fluid volume  A BPP was performed and was 8/8.  The UA Dopplers are elevated with intermittent AEDF but no  evidence of AEDF was noted.  I discussed today's findings with Ms. Dutch Quint. I explained that I  am concerned for superimposed  preeclampsia.  We discussed the risk of placental abruption, fetal growth  restriction, stroke, stillbirth and eclampsia.  Therefore I recommend admission with preeclampsia  evaluation.  To include:  CBC, CMP, UPC  NST  Magnesium and BMZ.  IF her labs are normal we recommend discontinuing the  magnesium and titrating her blood pressure to maintain and  avg BP 135/85 mmHg.  Given FGR in the context of severe HTN we would  recommend delivery for abnormal trending labs, poor fetal  testing persistent reversed EDF or AEDF after 32 weeks.  If supper imposed preeclampsia is diagnosed inpatient  management with delivery at 34 weeks unless the UA  Dopplers become persistently AEDF.  While inpatient daily NST's, 2x weekly UA Dopplers with  BPPs.  I discussed this plan of care with Precious Gilding and Dr.  Adrian Blackwater.  All questions answered.  I spent 30 minutes with > 50% in face to face consultation  and care coordination.  Novella Olive, MD ----------------------------------------------------------------------              Lin Landsman, MD Electronically Signed Final Report   05/05/2022 05:21 pm ----------------------------------------------------------------------  [place] MFM UA CORD DOPPLER  Result Date: 05/05/2022 ----------------------------------------------------------------------  OBSTETRICS REPORT                       (Signed Final 05/05/2022 05:21 pm) ---------------------------------------------------------------------- Patient Info  ID #:       [id number]                          D.O.B.:  [birth year] (31 yrs)  Name:  Kim Miles                Visit Date: 05/05/2022 04:33 pm ---------------------------------------------------------------------- Performed By  Attending:        Lin Landsman      Ref. Address:     [address]                    [address]                                                             [address]  Performed By:     Eden Lathe BS      [facility]  Referred By:      [facility] ---------------------------------------------------------------------- Orders  #  Description                           Code        Ordered By  1  [place] MFM OB FOLLOW UP                   [id number]    YU FANG  2  [place] MFM FETAL BPP WO NON               76819.01    YU FANG     STRESS  3  [place] MFM UA CORD DOPPLER                12751.70    YU FANG ----------------------------------------------------------------------  #  Order #                     Accession #                Episode #  1  [id number]                   [id number]                 [id number]  2  [id number]                   [id number]  [id number]  3  [id number]                   [id number]                 [id number] ---------------------------------------------------------------------- Indications  [redacted] weeks gestation of pregnancy                Z3A.31  Obesity complicating pregnancy, third          O99.213  trimester (nifedipine)  Diabetes - Pregestational,3rd trimester (diet  O24.313  control)  Hypertension - Chronic/Pre-existing            O10.019  LR-NIPS/AFP-Neg/Horizon-Neg  [redacted] weeks gestation of pregnancy                Z3A.28 ---------------------------------------------------------------------- Fetal Evaluation  Num Of Fetuses:         1  Fetal Heart Rate(bpm):  157  Cardiac Activity:       Observed  Presentation:           Cephalic  Placenta:               Anterior  P. Cord Insertion:      Visualized  Amniotic Fluid  AFI FV:      Subjectively low-normal  AFI Sum(cm)     %Tile       Largest Pocket(cm)  8.01            3.3         3.93  RUQ(cm)                     LUQ(cm)        LLQ(cm)  3.93                        2.72           1.36  ---------------------------------------------------------------------- Biometry  BPD:     73.87  mm     G. Age:  29w 4d          6  %    CI:        73.33   %    70 - 86                                                          FL/HC:      18.8   %    19.3 - 21.3  HC:    274.14   mm     G. Age:  30w 0d        2.3  %    HC/AC:      1.09        0.96 - 1.17  AC:    252.34   mm     G. Age:  29w 3d          8  %    FL/BPD:     69.9   %    71 - 87  FL:      51.64  mm     G. Age:  27w 4d        < 1  %    FL/AC:      20.5   %    20 - 24  HUM:  47.6  mm     G. Age:  28w 0d        < 5  %  LV:        2.9  mm  Est. FW:    1301  gm    2 lb 14 oz     1.7  % ---------------------------------------------------------------------- OB History  Gravidity:    1         Term:   0        Prem:   0        SAB:   0  TOP:          0       Ectopic:  0        Living: 0 ---------------------------------------------------------------------- Gestational Age  LMP:           31w 1d        Date:  09/29/21                  EDD:   07/06/22  U/S Today:     29w 1d                                        EDD:   07/20/22  Best:          31w 1d     Det. By:  LMP  (09/29/21)          EDD:   07/06/22 ---------------------------------------------------------------------- Anatomy  Cranium:               Appears normal         Aortic Arch:            Previously seen  Cavum:                 Appears normal         Ductal Arch:            Previously seen  Ventricles:            Appears normal         Diaphragm:              Appears normal  Choroid Plexus:        Previously seen        Stomach:                Appears normal, left                                                                        sided  Cerebellum:            Previously seen        Abdomen:                Appears normal  Posterior Fossa:       Previously seen        Abdominal Wall:         Previously seen  Nuchal Fold:           Previously seen        Cord Vessels:  Previously seen  Face:                   Appears normal         Kidneys:                Appear normal                         (orbits and profile)  Lips:                  Previously seen        Bladder:                Appears normal  Thoracic:              Appears normal         Spine:                  Previously seen  Heart:                 Previously seen        Upper Extremities:      Previously seen  RVOT:                  Appears normal         Lower Extremities:      Previously seen  LVOT:                  Previously seen  Other:  VC, 3VV and 3VTV previously visualized. Nasal bone, lenses,          maxilla, mandible and falx previously visualized. Feet and open          hands/5th digits previously visualized. Fetus appears to be a female. ---------------------------------------------------------------------- Doppler - Fetal Vessels  Umbilical Artery   S/D     %tile      RI    %tile      PI    %tile     PSV    ADFV    RDFV                                                     (cm/s)   7.06   > 97.5    0.86   > 97.5    1.61   > 97.5    30.93     Yes      No ---------------------------------------------------------------------- Impression  MFM Brief Note  Kim Miles is a G1P0 who is seen at 30 w 1d for follow up  growth given chronic hypertension.  She is seen today at the request of Danelle Berry, PA-C.  She is overall doing well. She denies s/sx of preeclampsia  with exception of fatigue.  Her blood pressure today was 162/113 with a repeat of  153/111 mmHg.  A follow up ultrasound was performed today.  Positive interval growth with measurements consistent with  severe fetal growth restriction with an EFW 1st%.  Good fetal movement and amniotic fluid volume  A BPP was performed and was 8/8.  The UA Dopplers are elevated with intermittent AEDF but no  evidence of AEDF was noted.  I discussed today's findings with Ms. Dutch Quint. I explained that I  am concerned for superimposed preeclampsia.  We discussed  the risk of placental abruption, fetal  growth  restriction, stroke, stillbirth and eclampsia.  Therefore I recommend admission with preeclampsia  evaluation.  To include:  CBC, CMP, UPC  NST  Magnesium and BMZ.  IF her labs are normal we recommend discontinuing the  magnesium and titrating her blood pressure to maintain and  avg BP 135/85 mmHg.  Given FGR in the context of severe HTN we would  recommend delivery for abnormal trending labs, poor fetal  testing persistent reversed EDF or AEDF after 32 weeks.  If supper imposed preeclampsia is diagnosed inpatient  management with delivery at 34 weeks unless the UA  Dopplers become persistently AEDF.  While inpatient daily NST's, 2x weekly UA Dopplers with  BPPs.  I discussed this plan of care with Precious Gilding and Dr.  Adrian Blackwater.  All questions answered.  I spent 30 minutes with > 50% in face to face consultation  and care coordination.  Novella Olive, MD ----------------------------------------------------------------------              Lin Landsman, MD Electronically Signed Final Report   05/05/2022 05:21 pm ----------------------------------------------------------------------    Medications:  Scheduled  aspirin EC  162 mg Oral Daily   docusate sodium  100 mg Oral Daily   escitalopram  10 mg Oral QHS   insulin aspart  0-14 Units Subcutaneous TID PC   labetalol  400 mg Oral TID   NIFEdipine  60 mg Oral BID   polyethylene glycol  17 g Oral Daily   prenatal multivitamin  1 tablet Oral Q1200   sodium chloride flush  3 mL Intravenous Q12H   I have reviewed the patient's current medications.  ASSESSMENT:  No change in care plan G1P0 [redacted]w[redacted]d Estimated Date of Delivery: 07/06/22  Principal Problem:   Severe preeclampsia, third trimester Active Problems:   Supervision of high risk pregnancy, antepartum   Maternal morbid obesity, antepartum (HCC)   Chronic hypertension affecting pregnancy   Gestational diabetes mellitus (GDM) affecting pregnancy, antepartum   IUGR (intrauterine  growth restriction) affecting care of mother, third trimester   [redacted] weeks gestation of pregnancy  PLAN: BP stable on current regimen of procardia and labbetalol- Continue to monitor closely. Patient completed magnesium sulfate for over 24 hours Labs remain normal, no evidence of HELLP.  Continue twice daily fetal monitoring Follow up UA dopplers today Elevated CBG s/p BMZ,  patient was ordered for sensitive scale sliding scale.  Patient is aware that cesarean delivery will be indicated for any worsening fetal status, uncontrolled BP, worsening labs or other serious maternal-fetal concern. Neonatology consult done, appreciate their input. Will continue close observation and routine antenatal care    Lazaro Arms 05/09/2022,8:27 AM

## 2022-05-09 NOTE — Progress Notes (Signed)
Called to room to assess pt. Patient diaphoretic and nauseated. Pt stated she just had insulin. BP and CBG checked (See assessment), juice given and MD made aware.

## 2022-05-10 DIAGNOSIS — Z3A31 31 weeks gestation of pregnancy: Secondary | ICD-10-CM | POA: Diagnosis not present

## 2022-05-10 DIAGNOSIS — O1413 Severe pre-eclampsia, third trimester: Secondary | ICD-10-CM | POA: Diagnosis not present

## 2022-05-10 LAB — GLUCOSE, CAPILLARY
Glucose-Capillary: 84 mg/dL (ref 70–99)
Glucose-Capillary: 134 mg/dL — ABNORMAL HIGH (ref 70–99)
Glucose-Capillary: 100 mg/dL — ABNORMAL HIGH (ref 70–99)
Glucose-Capillary: 137 mg/dL — ABNORMAL HIGH (ref 70–99)
Glucose-Capillary: 119 mg/dL — ABNORMAL HIGH (ref 70–99)

## 2022-05-10 LAB — COMPREHENSIVE METABOLIC PANEL
ALT: 12 U/L (ref 0–44)
AST: 18 U/L (ref 15–41)
Albumin: 2.4 g/dL — ABNORMAL LOW (ref 3.5–5.0)
Alkaline Phosphatase: 88 U/L (ref 38–126)
Anion gap: 10 (ref 5–15)
BUN: 15 mg/dL (ref 6–20)
CO2: 22 mmol/L (ref 22–32)
Calcium: 8.8 mg/dL — ABNORMAL LOW (ref 8.9–10.3)
Chloride: 103 mmol/L (ref 98–111)
Creatinine, Ser: 0.8 mg/dL (ref 0.44–1.00)
GFR, Estimated: >60 mL/min (ref >60)
Glucose, Bld: 86 mg/dL (ref 70–99)
Potassium: 4.3 mmol/L (ref 3.5–5.1)
Sodium: 135 mmol/L (ref 135–145)
Total Bilirubin: 0.4 mg/dL (ref 0.3–1.2)
Total Protein: 5.9 g/dL — ABNORMAL LOW (ref 6.5–8.1)

## 2022-05-10 LAB — CBC
HCT: 34.1 % — ABNORMAL LOW (ref 36.0–46.0)
Hemoglobin: 11.8 g/dL — ABNORMAL LOW (ref 12.0–15.0)
MCH: 30.6 pg (ref 26.0–34.0)
MCHC: 34.6 g/dL (ref 30.0–36.0)
MCV: 88.6 fL (ref 80.0–100.0)
Platelets: 287 10*3/uL (ref 150–400)
RBC: 3.85 MIL/uL — ABNORMAL LOW (ref 3.87–5.11)
RDW: 13.4 % (ref 11.5–15.5)
WBC: 14.3 10*3/uL — ABNORMAL HIGH (ref 4.0–10.5)
nRBC: 0 % (ref 0.0–0.2)

## 2022-05-10 NOTE — Inpatient Diabetes Management (Signed)
Inpatient Diabetes Program Recommendations  ADA Standards of Care 2023 Diabetes in Pregnancy Target Glucose Ranges:  Fasting: 70 - 95 mg/dL 1 hr postprandial:  347 - 140mg /dL (from first bite of meal) 2 hr postprandial:  100 - 120 mg/dL (from first bit of meal)    Lab Results  Component Value Date   GLUCAP 100 (H) 05/10/2022   HGBA1C 5.8 (H) 05/05/2022    Review of Glycemic Control  Diabetes history: GDM  Outpatient Diabetes medications: Diet controlled Current orders for Inpatient glycemic control:  Novolog 0-14 units tid  Spoke with pt regarding diet and glucose control in the hospital. Pt is insulin naive. Pt was unsure of insulin dose due to an undesirable feeling she had yesterday when insulin was given. However, glucose never went low. Today pt postprandial glucose increased to 134. Pt last had BMZ on 7/12-7/13. Trends gradually decreasing.  Note: Pt reports being taught outpatient she could have 60 grams of carbs per meal (this information is consistent with regular patient that has type 2 DM). I compared pts with Type 2 Diabetes versus pts who have GDM. Discussed glucose goals for each to help explain how many carbs she needs to have during pregnancy (30 gm breakfast, 45 gm lunch, 45 gm lunch).   The time that pt is needing Novolog postprandially is at lunchtime. Pt consuming more than 30 gm of carbs for breakfast. Discussed with pt to reduce the breakfast carbs for tomorrow and see if her glucose levels stay down without the need for insulin. Will follow glucose trends.  Thanks,  06-06-1984 RN, MSN, BC-ADM Inpatient Diabetes Coordinator Team Pager [phone] (8a-5p)

## 2022-05-10 NOTE — Progress Notes (Signed)
Patient ID: Mikayela Deats, female   DOB: November 02, 1990, 31 y.o.   MRN: [id number] FACULTY PRACTICE ANTEPARTUM(COMPREHENSIVE) NOTE  Missy Baksh is a 31 y.o. G1P0 with Estimated Date of Delivery: 07/06/22   By  early ultrasound [redacted]w[redacted]d  who is admitted for . for severe preeclampsia superimposed on CHTN in the setting of severe fetal growth restriction (EFW 1%), elevated UA dopplers with intermittent AEDF.  Estimated Date of Delivery: 07/06/22 Fetal presentation is cephalic.    Fetal presentation is cephalic. Length of Stay:  5  Days  Date of admission:05/05/2022  Subjective: No complaints Patient reports the fetal movement as active. Patient reports uterine contraction  activity as none. Patient reports  vaginal bleeding as none. Patient describes fluid per vagina as None.  Vitals:  Blood pressure 125/86, pulse 75, temperature 98.2 F (36.8 C), temperature source Oral, resp. rate 16, height 4\' 11"  (1.499 m), weight 89.8 kg, last menstrual period 09/29/2021, SpO2 99 %. Vitals:   05/09/22 2124 05/09/22 2125 05/09/22 2331 05/10/22 0351  BP:   127/81 125/86  Pulse: 81  83 75  Resp:   16   Temp:   98.2 F (36.8 C)   TempSrc:   Oral   SpO2:  99% 99%   Weight:      Height:       Physical Examination:  General appearance - alert, well appearing, and in no distress Abdomen - soft, nontender, nondistended, no masses or organomegaly Fundal Height:  size equals dates Pelvic Exam:  examination not indicated Cervical Exam: Not evaluated.  Extremities: extremities normal, atraumatic, no cyanosis or edema with DTRs 2+ bilaterally Membranes:intact  Fetal Monitoring:  Baseline: 140s bpm, Variability: Good {> 6 bpm), Accelerations: Reactive, and Decelerations: Absent   reactive  Labs:  Results for orders placed or performed during the hospital encounter of 05/05/22 (from the past 24 hour(s))  Glucose, capillary   Collection Time: 05/09/22 10:49 AM  Result Value Ref Range    Glucose-Capillary 102 (H) 70 - 99 mg/dL  Glucose, capillary   Collection Time: 05/09/22 12:09 PM  Result Value Ref Range   Glucose-Capillary 86 70 - 99 mg/dL  Glucose, capillary   Collection Time: 05/09/22  4:50 PM  Result Value Ref Range   Glucose-Capillary 98 70 - 99 mg/dL  Comprehensive metabolic panel   Collection Time: 05/09/22  5:06 PM  Result Value Ref Range   Sodium 135 135 - 145 mmol/L   Potassium 4.3 3.5 - 5.1 mmol/L   Chloride 103 98 - 111 mmol/L   CO2 21 (L) 22 - 32 mmol/L   Glucose, Bld 90 70 - 99 mg/dL   BUN 17 6 - 20 mg/dL   Creatinine, Ser 05/11/22 0.44 - 1.00 mg/dL   Calcium 9.3 8.9 - 0.10 mg/dL   Total Protein 6.2 (L) 6.5 - 8.1 g/dL   Albumin 2.4 (L) 3.5 - 5.0 g/dL   AST 18 15 - 41 U/L   ALT 14 0 - 44 U/L   Alkaline Phosphatase 98 38 - 126 U/L   Total Bilirubin 0.3 0.3 - 1.2 mg/dL   GFR, Estimated 27.2 >53 mL/min   Anion gap 11 5 - 15  CBC   Collection Time: 05/09/22  5:06 PM  Result Value Ref Range   WBC 15.9 (H) 4.0 - 10.5 K/uL   RBC 3.96 3.87 - 5.11 MIL/uL   Hemoglobin 11.9 (L) 12.0 - 15.0 g/dL   HCT 05/11/22 (L) 44.0 - 34.7 %   MCV 90.7 80.0 -  100.0 fL   MCH 30.1 26.0 - 34.0 pg   MCHC 33.1 30.0 - 36.0 g/dL   RDW 50.5 39.7 - 67.3 %   Platelets 308 150 - 400 K/uL   nRBC 0.1 0.0 - 0.2 %  Glucose, capillary   Collection Time: 05/09/22  7:53 PM  Result Value Ref Range   Glucose-Capillary 100 (H) 70 - 99 mg/dL   Comment 1 Notify RN   Glucose, capillary   Collection Time: 05/09/22 10:09 PM  Result Value Ref Range   Glucose-Capillary 113 (H) 70 - 99 mg/dL   Comment 1 Notify RN   CBC   Collection Time: 05/10/22  4:39 AM  Result Value Ref Range   WBC 14.3 (H) 4.0 - 10.5 K/uL   RBC 3.85 (L) 3.87 - 5.11 MIL/uL   Hemoglobin 11.8 (L) 12.0 - 15.0 g/dL   HCT 41.9 (L) 37.9 - 02.4 %   MCV 88.6 80.0 - 100.0 fL   MCH 30.6 26.0 - 34.0 pg   MCHC 34.6 30.0 - 36.0 g/dL   RDW 09.7 35.3 - 29.9 %   Platelets 287 150 - 400 K/uL   nRBC 0.0 0.0 - 0.2 %  Comprehensive  metabolic panel   Collection Time: 05/10/22  4:39 AM  Result Value Ref Range   Sodium 135 135 - 145 mmol/L   Potassium 4.3 3.5 - 5.1 mmol/L   Chloride 103 98 - 111 mmol/L   CO2 22 22 - 32 mmol/L   Glucose, Bld 86 70 - 99 mg/dL   BUN 15 6 - 20 mg/dL   Creatinine, Ser 2.42 0.44 - 1.00 mg/dL   Calcium 8.8 (L) 8.9 - 10.3 mg/dL   Total Protein 5.9 (L) 6.5 - 8.1 g/dL   Albumin 2.4 (L) 3.5 - 5.0 g/dL   AST 18 15 - 41 U/L   ALT 12 0 - 44 U/L   Alkaline Phosphatase 88 38 - 126 U/L   Total Bilirubin 0.4 0.3 - 1.2 mg/dL   GFR, Estimated >68 >34 mL/min   Anion gap 10 5 - 15  Glucose, capillary   Collection Time: 05/10/22  6:04 AM  Result Value Ref Range   Glucose-Capillary 84 70 - 99 mg/dL   Comment 1 Notify RN     Imaging Studies:    [place] MFM UA Cord Doppler  Result Date: 05/08/2022 ----------------------------------------------------------------------  OBSTETRICS REPORT                       (Signed Final 05/08/2022 03:17 pm) ---------------------------------------------------------------------- Patient Info  ID #:       [id number]                          D.O.B.:  08-Aug-1991 (31 yrs)  Name:       Huntley Dec PASCHAL Negro              Visit Date: 05/08/2022 01:26 pm ---------------------------------------------------------------------- Performed By  Attending:        Lin Landsman      Ref. Address:     [address]                    [address]  Performed By:     Isac Sarna        [facility]  Referred By:      [facility] ---------------------------------------------------------------------- Orders  #  Description                           Code        Ordered By  1  [place] MFM UA CORD DOPPLER                76820.02    PEGGY CONSTANT  2  [place] MFM FETAL BPP WO NON               76819.01    PEGGY CONSTANT     STRESS  ----------------------------------------------------------------------  #  Order #                     Accession #                Episode #  1  [id number]                   [id number]                 [id number]  2  [id number]                   [id number]                 [id number] ---------------------------------------------------------------------- Indications  Obesity complicating pregnancy, third          O99.213  trimester (nifedipine)  Diabetes - Pregestational,3rd trimester (diet  O24.313  control)  Hypertension - Chronic/Pre-existing            O10.019  [redacted] weeks gestation of pregnancy                Z3A.31  LR-NIPS/AFP-Neg/Horizon-Neg ---------------------------------------------------------------------- Fetal Evaluation  Num Of Fetuses:         1  Fetal Heart Rate(bpm):  145  Cardiac Activity:       Observed  Presentation:           Cephalic  Placenta:               Anterior  P. Cord Insertion:      Previously Visualized  Amniotic Fluid  AFI FV:      Within normal limits  AFI Sum(cm)     %Tile       Largest Pocket(cm)  14.96           53          5.31  RUQ(cm)       RLQ(cm)       LUQ(cm)        LLQ(cm)  3.31          3.58          5.31           2.76 ---------------------------------------------------------------------- Biophysical Evaluation  Amniotic F.V:   Within normal limits       F. Tone:  Observed  F. Movement:    Observed                   Score:          8/8  F. Breathing:   Observed ---------------------------------------------------------------------- Biometry  LV:        4.3  mm ---------------------------------------------------------------------- OB History  Gravidity:    1         Term:   0        Prem:   0        SAB:   0  TOP:          0       Ectopic:  0        Living: 0 ---------------------------------------------------------------------- Gestational Age  LMP:           31w 4d        Date:  09/29/21                  EDD:   07/06/22  Best:          31w 4d     Det. By:  LMP  (09/29/21)           EDD:   07/06/22 ---------------------------------------------------------------------- Anatomy  Ventricles:            Appears normal         Kidneys:                Appear normal  Diaphragm:             Appears normal         Bladder:                Appears normal  Stomach:               Appears normal, left                         sided ---------------------------------------------------------------------- Doppler - Fetal Vessels  Umbilical Artery   S/D     %tile      RI    %tile      PI    %tile     PSV    ADFV    RDFV                                                     (cm/s)   5.76   > 97.5    0.83   > 97.5    1.52   > 97.5    28.16      No      No ---------------------------------------------------------------------- Cervix Uterus Adnexa  Cervix  Not visualized (advanced GA >24wks)  Uterus  No abnormality visualized.  Right Ovary  Not visualized.  Left Ovary  Not visualized.  Cul De Sac  No free fluid seen.  Adnexa  No abnormality visualized. ---------------------------------------------------------------------- Impression  Antenatal testing due to severe FGR  Biophysical profile 8/8 with good fetal movement and  amniotic fluid volume  UA Dopplers are elevated with no evidence of AEDF or  REDF. ---------------------------------------------------------------------- Recommendations  Continue 2x weekly  UA/BPP with daily NST. ----------------------------------------------------------------------              Lin Landsman, MD Electronically Signed Final Report   05/08/2022  03:17 pm ----------------------------------------------------------------------  [place] MFM FETAL BPP WO NON STRESS  Result Date: 05/08/2022 ----------------------------------------------------------------------  OBSTETRICS REPORT                       (Signed Final 05/08/2022 03:17 pm) ---------------------------------------------------------------------- Patient Info  ID #:       [id number]                          D.O.B.:  24-Oct-1991 (31  yrs)  Name:       Huntley Dec PASCHAL Pyeatt              Visit Date: 05/08/2022 01:26 pm ---------------------------------------------------------------------- Performed By  Attending:        Lin Landsman      Ref. Address:     [address]                    [address]                                                             [address]  Performed By:     Isac Sarna        [facility]  Referred By:      [facility] ---------------------------------------------------------------------- Orders  #  Description                           Code        Ordered By  1  [place] MFM UA CORD DOPPLER                [phone]    PEGGY CONSTANT  2  [place] MFM FETAL BPP WO NON               [id number]    PEGGY CONSTANT     STRESS ----------------------------------------------------------------------  #  Order #                     Accession #                Episode #  1  [id number]                   [id number]                 [id number]  2  [id number]                   [id number]                 [id number] ---------------------------------------------------------------------- Indications  Obesity complicating pregnancy, third          O99.213  trimester (nifedipine)  Diabetes - Pregestational,3rd trimester (diet  O24.313  control)  Hypertension - Chronic/Pre-existing            O10.019  [redacted] weeks gestation of pregnancy                Z3A.31  LR-NIPS/AFP-Neg/Horizon-Neg ---------------------------------------------------------------------- Fetal Evaluation  Num Of Fetuses:         1  Fetal Heart Rate(bpm):  145  Cardiac Activity:       Observed  Presentation:           Cephalic  Placenta:               Anterior  P. Cord Insertion:      Previously Visualized  Amniotic Fluid  AFI FV:      Within normal limits  AFI Sum(cm)     %Tile       Largest Pocket(cm)  14.96           53          5.31  RUQ(cm)       RLQ(cm)       LUQ(cm)        LLQ(cm)  3.31           3.58          5.31           2.76 ---------------------------------------------------------------------- Biophysical Evaluation  Amniotic F.V:   Within normal limits       F. Tone:        Observed  F. Movement:    Observed                   Score:          8/8  F. Breathing:   Observed ---------------------------------------------------------------------- Biometry  LV:        4.3  mm ---------------------------------------------------------------------- OB History  Gravidity:    1         Term:   0        Prem:   0        SAB:   0  TOP:          0       Ectopic:  0        Living: 0 ---------------------------------------------------------------------- Gestational Age  LMP:           31w 4d        Date:  09/29/21                  EDD:   07/06/22  Best:          31w 4d     Det. By:  LMP  (09/29/21)          EDD:   07/06/22 ---------------------------------------------------------------------- Anatomy  Ventricles:            Appears normal         Kidneys:                Appear normal  Diaphragm:             Appears normal         Bladder:                Appears normal  Stomach:               Appears normal, left                         sided ---------------------------------------------------------------------- Doppler - Fetal Vessels  Umbilical Artery   S/D     %tile      RI    %  tile      PI    %tile     PSV    ADFV    RDFV                                                     (cm/s)   5.76   > 97.5    0.83   > 97.5    1.52   > 97.5    28.16      No      No ---------------------------------------------------------------------- Cervix Uterus Adnexa  Cervix  Not visualized (advanced GA >24wks)  Uterus  No abnormality visualized.  Right Ovary  Not visualized.  Left Ovary  Not visualized.  Cul De Sac  No free fluid seen.  Adnexa  No abnormality visualized. ---------------------------------------------------------------------- Impression  Antenatal testing due to severe FGR  Biophysical profile 8/8 with good fetal movement and   amniotic fluid volume  UA Dopplers are elevated with no evidence of AEDF or  REDF. ---------------------------------------------------------------------- Recommendations  Continue 2x weekly  UA/BPP with daily NST. ----------------------------------------------------------------------              Lin Landsmanorenthian Booker, MD Electronically Signed Final Report   05/08/2022 03:17 pm ----------------------------------------------------------------------    Medications:  Scheduled  aspirin EC  162 mg Oral Daily   docusate sodium  100 mg Oral Daily   escitalopram  10 mg Oral QHS   insulin aspart  0-14 Units Subcutaneous TID PC   labetalol  400 mg Oral TID   NIFEdipine  60 mg Oral BID   polyethylene glycol  17 g Oral Daily   prenatal multivitamin  1 tablet Oral Q1200   sodium chloride flush  3 mL Intravenous Q12H   I have reviewed the patient's current medications.  ASSESSMENT:  No change in care plan G1P0 8146w6d  Estimated Date of Delivery: 07/06/22  Principal Problem:   Severe preeclampsia, third trimester Active Problems:   Supervision of high risk pregnancy, antepartum   Maternal morbid obesity, antepartum (HCC)   Chronic hypertension affecting pregnancy   Gestational diabetes mellitus (GDM) affecting pregnancy, antepartum   IUGR (intrauterine growth restriction) affecting care of mother, third trimester   [redacted] weeks gestation of pregnancy  PLAN: BP stable on current regimen of procardia and labbetalol- Continue to monitor closely. Patient completed magnesium sulfate for over 24 hours Labs remain normal, no evidence of HELLP.  Continue twice daily fetal monitoring Follow up UA dopplers today Elevated CBG s/p BMZ,  patient was ordered for sensitive scale sliding scale.  Patient is aware that cesarean delivery will be indicated for any worsening fetal status, uncontrolled BP, worsening labs or other serious maternal-fetal concern. Neonatology consult done, appreciate their input. Will continue  close observation and routine antenatal care    Lazaro ArmsLuther H Aviyanna Colbaugh 05/10/2022,8:46 AM    Patient ID: Barnett ApplebaumSara Paschal Rady, female   DOB: Dec 10, 1990, 31 y.o.   MRN: [id number]

## 2022-05-11 DIAGNOSIS — Z3A32 32 weeks gestation of pregnancy: Secondary | ICD-10-CM

## 2022-05-11 DIAGNOSIS — O1413 Severe pre-eclampsia, third trimester: Secondary | ICD-10-CM | POA: Diagnosis not present

## 2022-05-11 LAB — COMPREHENSIVE METABOLIC PANEL
ALT: 12 U/L (ref 0–44)
AST: 18 U/L (ref 15–41)
Albumin: 2.3 g/dL — ABNORMAL LOW (ref 3.5–5.0)
Alkaline Phosphatase: 90 U/L (ref 38–126)
Anion gap: 7 (ref 5–15)
BUN: 19 mg/dL (ref 6–20)
CO2: 22 mmol/L (ref 22–32)
Calcium: 8.7 mg/dL — ABNORMAL LOW (ref 8.9–10.3)
Chloride: 105 mmol/L (ref 98–111)
Creatinine, Ser: 0.7 mg/dL (ref 0.44–1.00)
GFR, Estimated: >60 mL/min (ref >60)
Glucose, Bld: 87 mg/dL (ref 70–99)
Potassium: 4.2 mmol/L (ref 3.5–5.1)
Sodium: 134 mmol/L — ABNORMAL LOW (ref 135–145)
Total Bilirubin: 0.5 mg/dL (ref 0.3–1.2)
Total Protein: 5.8 g/dL — ABNORMAL LOW (ref 6.5–8.1)

## 2022-05-11 LAB — GLUCOSE, CAPILLARY
Glucose-Capillary: 83 mg/dL (ref 70–99)
Glucose-Capillary: 111 mg/dL — ABNORMAL HIGH (ref 70–99)
Glucose-Capillary: 106 mg/dL — ABNORMAL HIGH (ref 70–99)
Glucose-Capillary: 104 mg/dL — ABNORMAL HIGH (ref 70–99)
Glucose-Capillary: 103 mg/dL — ABNORMAL HIGH (ref 70–99)

## 2022-05-11 LAB — CBC
HCT: 35.6 % — ABNORMAL LOW (ref 36.0–46.0)
Hemoglobin: 12 g/dL (ref 12.0–15.0)
MCH: 30.3 pg (ref 26.0–34.0)
MCHC: 33.7 g/dL (ref 30.0–36.0)
MCV: 89.9 fL (ref 80.0–100.0)
Platelets: 309 10*3/uL (ref 150–400)
RBC: 3.96 MIL/uL (ref 3.87–5.11)
RDW: 13.2 % (ref 11.5–15.5)
WBC: 14 10*3/uL — ABNORMAL HIGH (ref 4.0–10.5)
nRBC: 0 % (ref 0.0–0.2)

## 2022-05-11 LAB — TYPE AND SCREEN
ABO/RH(D): A POS
Antibody Screen: NEGATIVE

## 2022-05-11 NOTE — Progress Notes (Signed)
Nutrition  Checked in with patient and husband to make sure CHO allotment for GDM diet here is understood. Pt seems to feel more comfortable with diet today. Initial GDM diet teaching allowed 60 g CHO at meals, which is different from inpatient diet order.  Double protein portions encouraged to help with hunger. Snacks delivered have not been accurate. Pt feels confident in counting CHO's and can adjust snack if inaccurate. Suggested appropriate snack foods be kept in room in case of non-delivered snacks

## 2022-05-11 NOTE — Progress Notes (Signed)
Patient ID: Kim Miles, female   DOB: Dec 28, 1990, 31 y.o.   MRN: [id number] FACULTY PRACTICE ANTEPARTUM(COMPREHENSIVE) NOTE  Kim ApplebaumSara Paschal Miles is a 31 y.o. G1P0 with Estimated Date of Delivery: 07/06/22   By  early ultrasound [id number]  [facility], severe FGR <1% with IAEDF.    Fetal presentation is cephalic. Length of Stay:  6  Days  Date of admission:05/05/2022  Subjective: No complaints this am Patient reports the fetal movement as active. Patient reports uterine contraction  activity as none. Patient reports  vaginal bleeding as none. Patient describes fluid per vagina as None.  Vitals:  Blood pressure 129/86, pulse 85, temperature 98.2 F (36.8 C), temperature source Oral, resp. rate 18, height 4\' 11"  (1.499 m), weight 89.8 kg, last menstrual period 09/29/2021, SpO2 99 %. Vitals:   05/10/22 2020 05/10/22 2213 05/10/22 2336 05/11/22 0335  BP: 131/87  120/74 129/86  Pulse: 83 82 75 85  Resp: 17  16 18   Temp: 97.9 F (36.6 C)  98.3 F (36.8 C) 98.2 F (36.8 C)  TempSrc: Oral  Oral Oral  SpO2: 100%  95% 99%  Weight:      Height:       Physical Examination:  General appearance - alert, well appearing, and in no distress Abdomen - soft, nontender, nondistended, no masses or organomegaly Fundal Height:  size <<dates Pelvic Exam:  examination not indicated Cervical Exam: Not evaluated. . Extremities: extremities normal, atraumatic, no cyanosis or edema with DTRs 3+ bilaterally Membranes:intact  Fetal Monitoring:  Baseline: 140s bpm, Variability: Good {> 6 bpm), Accelerations: Reactive, and Decelerations: Absent   reactive  Labs:  Results for orders placed or performed during the hospital encounter of 05/05/22 (from the past 24 hour(s))  Glucose, capillary   Collection Time: 05/10/22 10:28 AM  Result Value Ref Range   Glucose-Capillary 134 (H) 70 - 99 mg/dL  Glucose, capillary   Collection Time: 05/10/22  2:15 PM  Result Value Ref Range    Glucose-Capillary 100 (H) 70 - 99 mg/dL  Glucose, capillary   Collection Time: 05/10/22  8:21 PM  Result Value Ref Range   Glucose-Capillary 137 (H) 70 - 99 mg/dL   Comment 1 Notify RN   Glucose, capillary   Collection Time: 05/10/22 10:16 PM  Result Value Ref Range   Glucose-Capillary 119 (H) 70 - 99 mg/dL  CBC   Collection Time: 05/11/22  5:23 AM  Result Value Ref Range   WBC 14.0 (H) 4.0 - 10.5 K/uL   RBC 3.96 3.87 - 5.11 MIL/uL   Hemoglobin 12.0 12.0 - 15.0 g/dL   HCT 32.435.6 (L) 40.136.0 - 02.746.0 %   MCV 89.9 80.0 - 100.0 fL   MCH 30.3 26.0 - 34.0 pg   MCHC 33.7 30.0 - 36.0 g/dL   RDW 25.313.2 66.411.5 - 40.315.5 %   Platelets 309 150 - 400 K/uL   nRBC 0.0 0.0 - 0.2 %  Comprehensive metabolic panel   Collection Time: 05/11/22  5:23 AM  Result Value Ref Range   Sodium 134 (L) 135 - 145 mmol/L   Potassium 4.2 3.5 - 5.1 mmol/L   Chloride 105 98 - 111 mmol/L   CO2 22 22 - 32 mmol/L   Glucose, Bld 87 70 - 99 mg/dL   BUN 19 6 - 20 mg/dL   Creatinine, Ser 4.740.70 0.44 - 1.00 mg/dL   Calcium 8.7 (L) 8.9 - 10.3 mg/dL   Total Protein 5.8 (L) 6.5 - 8.1  g/dL   Albumin 2.3 (L) 3.5 - 5.0 g/dL   AST 18 15 - 41 U/L   ALT 12 0 - 44 U/L   Alkaline Phosphatase 90 38 - 126 U/L   Total Bilirubin 0.5 0.3 - 1.2 mg/dL   GFR, Estimated >59 >16 mL/min   Anion gap 7 5 - 15  Glucose, capillary   Collection Time: 05/11/22  6:05 AM  Result Value Ref Range   Glucose-Capillary 83 70 - 99 mg/dL   Comment 1 Notify RN     Imaging Studies:    [place] MFM UA Cord Doppler  Result Date: 05/08/2022 ----------------------------------------------------------------------  OBSTETRICS REPORT                       (Signed Final 05/08/2022 03:17 pm) ---------------------------------------------------------------------- Patient Info  ID #:       [id number]                          D.O.B.:  1991-04-10 (31 yrs)  Name:       Kim Miles              Visit Date: 05/08/2022 01:26 pm  ---------------------------------------------------------------------- Performed By  Attending:        Lin Landsman      Ref. Address:     [address]                    [place]                                                             [address]  Performed By:     Isac Sarna        [facility]  Referred By:      [facility] Mila Merry ---------------------------------------------------------------------- Orders  #  Description                           Code        Ordered By  1  [place] MFM UA CORD DOPPLER                76820.02    PEGGY CONSTANT  2  [place] MFM FETAL BPP WO NON               57017.79    PEGGY CONSTANT     STRESS ----------------------------------------------------------------------  #  Order #                     Accession #                Episode #  1  [id number]                   [id number]  [id number]  2  [id number]                   [id number]                 [id number] ---------------------------------------------------------------------- Indications  Obesity complicating pregnancy, third          O99.213  trimester (nifedipine)  Diabetes - Pregestational,3rd trimester (diet  O24.313  control)  Hypertension - Chronic/Pre-existing            O10.019  [redacted] weeks gestation of pregnancy                Z3A.31  LR-NIPS/AFP-Neg/Horizon-Neg ---------------------------------------------------------------------- Fetal Evaluation  Num Of Fetuses:         1  Fetal Heart Rate(bpm):  145  Cardiac Activity:       Observed  Presentation:           Cephalic  Placenta:               Anterior  P. Cord Insertion:      Previously Visualized  Amniotic Fluid  AFI FV:      Within normal limits  AFI Sum(cm)     %Tile       Largest Pocket(cm)  14.96           53          5.31  RUQ(cm)       RLQ(cm)       LUQ(cm)        LLQ(cm)  3.31          3.58          5.31           2.76  ---------------------------------------------------------------------- Biophysical Evaluation  Amniotic F.V:   Within normal limits       F. Tone:        Observed  F. Movement:    Observed                   Score:          8/8  F. Breathing:   Observed ---------------------------------------------------------------------- Biometry  LV:        4.3  mm ---------------------------------------------------------------------- OB History  Gravidity:    1         Term:   0        Prem:   0        SAB:   0  TOP:          0       Ectopic:  0        Living: 0 ---------------------------------------------------------------------- Gestational Age  LMP:           31w 4d        Date:  09/29/21                  EDD:   07/06/22  Best:          31w 4d     Det. By:  LMP  (09/29/21)          EDD:   07/06/22 ---------------------------------------------------------------------- Anatomy  Ventricles:            Appears normal         Kidneys:                Appear normal  Diaphragm:             Appears normal  Bladder:                Appears normal  Stomach:               Appears normal, left                         sided ---------------------------------------------------------------------- Doppler - Fetal Vessels  Umbilical Artery   S/D     %tile      RI    %tile      PI    %tile     PSV    ADFV    RDFV                                                     (cm/s)   5.76   > 97.5    0.83   > 97.5    1.52   > 97.5    28.16      No      No ---------------------------------------------------------------------- Cervix Uterus Adnexa  Cervix  Not visualized (advanced GA >24wks)  Uterus  No abnormality visualized.  Right Ovary  Not visualized.  Left Ovary  Not visualized.  Cul De Sac  No free fluid seen.  Adnexa  No abnormality visualized. ---------------------------------------------------------------------- Impression  Antenatal testing due to severe FGR  Biophysical profile 8/8 with good fetal movement and  amniotic fluid volume  UA  Dopplers are elevated with no evidence of AEDF or  REDF. ---------------------------------------------------------------------- Recommendations  Continue 2x weekly  UA/BPP with daily NST. ----------------------------------------------------------------------              Lin Landsman, MD Electronically Signed Final Report   05/08/2022 03:17 pm ----------------------------------------------------------------------  [place] MFM FETAL BPP WO NON STRESS  Result Date: 05/08/2022 ----------------------------------------------------------------------  OBSTETRICS REPORT                       (Signed Final 05/08/2022 03:17 pm) ---------------------------------------------------------------------- Patient Info  ID #:       [id number]                          D.O.B.:  [birth year] (31 yrs)  Name:       Kim Dec PASCHAL Louison              Visit Date: 05/08/2022 01:26 pm ---------------------------------------------------------------------- Performed By  Attending:        Lin Landsman      Ref. Address:     [address]                    [place]                                                             [address]  Performed By:     Isac Sarna        [facility]  Referred By:      [facility] Mila Merry ---------------------------------------------------------------------- Orders  #  Description                           Code        Ordered By  1  [place] MFM UA CORD DOPPLER                [id number]    PEGGY CONSTANT  2  [place] MFM FETAL BPP WO NON               [id number]    PEGGY CONSTANT     STRESS ----------------------------------------------------------------------  #  Order #                     Accession #                Episode #  1  [id number]                   [id number]                 [id number]  2  [id number]                   [id number]                 [id number] ----------------------------------------------------------------------  Indications  Obesity complicating pregnancy, third          O99.213  trimester (nifedipine)  Diabetes - Pregestational,3rd trimester (diet  O24.313  control)  Hypertension - Chronic/Pre-existing            O10.019  [redacted] weeks gestation of pregnancy                Z3A.31  LR-NIPS/AFP-Neg/Horizon-Neg ---------------------------------------------------------------------- Fetal Evaluation  Num Of Fetuses:         1  Fetal Heart Rate(bpm):  145  Cardiac Activity:       Observed  Presentation:           Cephalic  Placenta:               Anterior  P. Cord Insertion:      Previously Visualized  Amniotic Fluid  AFI FV:      Within normal limits  AFI Sum(cm)     %Tile       Largest Pocket(cm)  14.96           53          5.31  RUQ(cm)       RLQ(cm)       LUQ(cm)        LLQ(cm)  3.31          3.58          5.31           2.76 ---------------------------------------------------------------------- Biophysical Evaluation  Amniotic F.V:   Within normal limits       F. Tone:        Observed  F. Movement:    Observed                   Score:          8/8  F. Breathing:   Observed ---------------------------------------------------------------------- Biometry  LV:        4.3  mm ---------------------------------------------------------------------- OB History  Gravidity:    1         Term:  0        Prem:   0        SAB:   0  TOP:          0       Ectopic:  0        Living: 0 ---------------------------------------------------------------------- Gestational Age  LMP:           31w 4d        Date:  09/29/21                  EDD:   07/06/22  Best:          31w 4d     Det. By:  LMP  (09/29/21)          EDD:   07/06/22 ---------------------------------------------------------------------- Anatomy  Ventricles:            Appears normal         Kidneys:                Appear normal  Diaphragm:             Appears normal         Bladder:                Appears normal  Stomach:               Appears normal, left                         sided  ---------------------------------------------------------------------- Doppler - Fetal Vessels  Umbilical Artery   S/D     %tile      RI    %tile      PI    %tile     PSV    ADFV    RDFV                                                     (cm/s)   5.76   > 97.5    0.83   > 97.5    1.52   > 97.5    28.16      No      No ---------------------------------------------------------------------- Cervix Uterus Adnexa  Cervix  Not visualized (advanced GA >24wks)  Uterus  No abnormality visualized.  Right Ovary  Not visualized.  Left Ovary  Not visualized.  Cul De Sac  No free fluid seen.  Adnexa  No abnormality visualized. ---------------------------------------------------------------------- Impression  Antenatal testing due to severe FGR  Biophysical profile 8/8 with good fetal movement and  amniotic fluid volume  UA Dopplers are elevated with no evidence of AEDF or  REDF. ---------------------------------------------------------------------- Recommendations  Continue 2x weekly  UA/BPP with daily NST. ----------------------------------------------------------------------              Lin Landsman, MD Electronically Signed Final Report   05/08/2022 03:17 pm ----------------------------------------------------------------------    Medications:  Scheduled  aspirin EC  162 mg Oral Daily   docusate sodium  100 mg Oral Daily   escitalopram  10 mg Oral QHS   insulin aspart  0-14 Units Subcutaneous TID PC   labetalol  400 mg Oral TID   NIFEdipine  60 mg Oral BID   polyethylene glycol  17 g Oral Daily   prenatal multivitamin  1 tablet Oral  Q1200   sodium chloride flush  3 mL Intravenous Q12H   I have reviewed the patient's current medications.  ASSESSMENT: G1P0 [redacted]w[redacted]d Estimated Date of Delivery: 07/06/22  CHTN with severe superimposed pre eclampsia FGR, severe <1%, with IAEDF A1DM  PLAN: >Labs all normal today with improved creatinine >Dopplers + BPP Tuesday/Fridays >Deliver at 34 weeks or as clinically  indicated >Twice daily NSTs >S/P BMZ >S/P NICU consult >CBG monitoring  Lazaro Arms 05/11/2022,7:29 AM

## 2022-05-12 ENCOUNTER — Inpatient Hospital Stay (HOSPITAL_COMMUNITY): Payer: Managed Care, Other (non HMO)

## 2022-05-12 ENCOUNTER — Encounter (HOSPITAL_COMMUNITY): Payer: Self-pay | Admitting: Anesthesiology

## 2022-05-12 DIAGNOSIS — O1413 Severe pre-eclampsia, third trimester: Secondary | ICD-10-CM | POA: Diagnosis not present

## 2022-05-12 DIAGNOSIS — O24439 Gestational diabetes mellitus in the puerperium, unspecified control: Secondary | ICD-10-CM | POA: Diagnosis not present

## 2022-05-12 DIAGNOSIS — O365930 Maternal care for other known or suspected poor fetal growth, third trimester, not applicable or unspecified: Secondary | ICD-10-CM | POA: Diagnosis not present

## 2022-05-12 DIAGNOSIS — E669 Obesity, unspecified: Secondary | ICD-10-CM

## 2022-05-12 DIAGNOSIS — O99213 Obesity complicating pregnancy, third trimester: Secondary | ICD-10-CM

## 2022-05-12 DIAGNOSIS — Z3A32 32 weeks gestation of pregnancy: Secondary | ICD-10-CM

## 2022-05-12 LAB — COMPREHENSIVE METABOLIC PANEL
ALT: 12 U/L (ref 0–44)
AST: 18 U/L (ref 15–41)
Albumin: 2.4 g/dL — ABNORMAL LOW (ref 3.5–5.0)
Alkaline Phosphatase: 89 U/L (ref 38–126)
Anion gap: 7 (ref 5–15)
BUN: 18 mg/dL (ref 6–20)
CO2: 21 mmol/L — ABNORMAL LOW (ref 22–32)
Calcium: 8.8 mg/dL — ABNORMAL LOW (ref 8.9–10.3)
Chloride: 107 mmol/L (ref 98–111)
Creatinine, Ser: 0.67 mg/dL (ref 0.44–1.00)
GFR, Estimated: >60 mL/min (ref >60)
Glucose, Bld: 82 mg/dL (ref 70–99)
Potassium: 4.1 mmol/L (ref 3.5–5.1)
Sodium: 135 mmol/L (ref 135–145)
Total Bilirubin: 0.3 mg/dL (ref 0.3–1.2)
Total Protein: 5.9 g/dL — ABNORMAL LOW (ref 6.5–8.1)

## 2022-05-12 LAB — CBC
HCT: 35 % — ABNORMAL LOW (ref 36.0–46.0)
Hemoglobin: 11.9 g/dL — ABNORMAL LOW (ref 12.0–15.0)
MCH: 30.7 pg (ref 26.0–34.0)
MCHC: 34 g/dL (ref 30.0–36.0)
MCV: 90.4 fL (ref 80.0–100.0)
Platelets: 298 10*3/uL (ref 150–400)
RBC: 3.87 MIL/uL (ref 3.87–5.11)
RDW: 13.4 % (ref 11.5–15.5)
WBC: 15 10*3/uL — ABNORMAL HIGH (ref 4.0–10.5)
nRBC: 0 % (ref 0.0–0.2)

## 2022-05-12 LAB — GLUCOSE, CAPILLARY
Glucose-Capillary: 91 mg/dL (ref 70–99)
Glucose-Capillary: 128 mg/dL — ABNORMAL HIGH (ref 70–99)
Glucose-Capillary: 114 mg/dL — ABNORMAL HIGH (ref 70–99)
Glucose-Capillary: 140 mg/dL — ABNORMAL HIGH (ref 70–99)
Glucose-Capillary: 99 mg/dL (ref 70–99)

## 2022-05-12 MED ORDER — LACTATED RINGERS IV BOLUS
500.0000 mL | Freq: Once | INTRAVENOUS | Status: AC
  Administered 2022-05-12: 500 mL via INTRAVENOUS

## 2022-05-12 NOTE — Progress Notes (Signed)
Patient ID: Kim Miles, female   DOB: Aug 02, 1991, 31 y.o.   MRN: [id number] [place]) NOTE  Kim Miles is a 31 y.o. G1P0 with Estimated Date of Delivery: 07/06/22   By  early ultrasound [redacted]w[redacted]d   who is admitted for [facility] with SIPE, severe FGR <1% with IAEDF.    Fetal presentation is cephalic. Length of Stay:  7  Days  Date of admission:05/05/2022  Subjective: No complaints this am Patient reports the fetal movement as active. Patient reports uterine contraction  activity as none. Patient reports  vaginal bleeding as none. Patient describes fluid per vagina as None.  Vitals:  Blood pressure 123/78, pulse 76, temperature 97.9 F (36.6 C), temperature source Oral, resp. rate 17, height 4\' 11"  (1.499 m), weight 89.8 kg, last menstrual period 09/29/2021, SpO2 98 %. Vitals:   05/11/22 1934 05/11/22 2149 05/12/22 0156 05/12/22 0531  BP: 121/83 132/87 136/90 123/78  Pulse: 76 77 77 76  Resp: 17  17   Temp: 98 F (36.7 C)  97.9 F (36.6 C)   TempSrc: Oral  Oral   SpO2: 98%  98%   Weight:      Height:       Physical Examination:  General appearance - alert, well appearing, and in no distress Abdomen - soft, nontender, nondistended, no masses or organomegaly Fundal Height:  size <<dates Pelvic Exam:  examination not indicated Cervical Exam: Not evaluated. . Extremities: extremities normal, atraumatic, no cyanosis or edema with DTRs 3+ bilaterally Membranes:intact  Fetal Monitoring:  Baseline: 140s bpm, Variability: Good {> 6 bpm), Accelerations: Reactive, and Decelerations: Absent   reactive  Labs:  Results for orders placed or performed during the hospital encounter of 05/05/22 (from the past 24 hour(s))  Glucose, capillary   Collection Time: 05/11/22 11:16 AM  Result Value Ref Range   Glucose-Capillary 111 (H) 70 - 99 mg/dL  Glucose, capillary   Collection Time: 05/11/22  2:37 PM  Result Value Ref Range   Glucose-Capillary 106 (H) 70  - 99 mg/dL  Type and screen [place]   Collection Time: 05/11/22  6:01 PM  Result Value Ref Range   ABO/RH(D) A POS    Antibody Screen NEG    Sample Expiration      05/14/2022,2359 Performed at [facility], [place] [address] [place], [place] [address]   Glucose, capillary   Collection Time: 05/11/22  7:36 PM  Result Value Ref Range   Glucose-Capillary 104 (H) 70 - 99 mg/dL  Glucose, capillary   Collection Time: 05/11/22 10:26 PM  Result Value Ref Range   Glucose-Capillary 103 (H) 70 - 99 mg/dL  Glucose, capillary   Collection Time: 05/12/22  5:31 AM  Result Value Ref Range   Glucose-Capillary 91 70 - 99 mg/dL  CBC   Collection Time: 05/12/22  6:07 AM  Result Value Ref Range   WBC 15.0 (H) 4.0 - 10.5 K/uL   RBC 3.87 3.87 - 5.11 MIL/uL   Hemoglobin 11.9 (L) 12.0 - 15.0 g/dL   HCT 35.0 (L) 36.0 - 46.0 %   MCV 90.4 80.0 - 100.0 fL   MCH 30.7 26.0 - 34.0 pg   MCHC 34.0 30.0 - 36.0 g/dL   RDW 13.4 11.5 - 15.5 %   Platelets 298 150 - 400 K/uL   nRBC 0.0 0.0 - 0.2 %  Comprehensive metabolic panel   Collection Time: 05/12/22  6:07 AM  Result Value Ref Range   Sodium 135  135 - 145 mmol/L   Potassium 4.1 3.5 - 5.1 mmol/L   Chloride 107 98 - 111 mmol/L   CO2 21 (L) 22 - 32 mmol/L   Glucose, Bld 82 70 - 99 mg/dL   BUN 18 6 - 20 mg/dL   Creatinine, Ser 0.67 0.44 - 1.00 mg/dL   Calcium 8.8 (L) 8.9 - 10.3 mg/dL   Total Protein 5.9 (L) 6.5 - 8.1 g/dL   Albumin 2.4 (L) 3.5 - 5.0 g/dL   AST 18 15 - 41 U/L   ALT 12 0 - 44 U/L   Alkaline Phosphatase 89 38 - 126 U/L   Total Bilirubin 0.3 0.3 - 1.2 mg/dL   GFR, Estimated >60 >60 mL/min   Anion gap 7 5 - 15      Latest Ref Rng & Units 05/12/2022    6:07 AM 05/11/2022    5:23 AM 05/10/2022    4:39 AM  CBC  WBC 4.0 - 10.5 K/uL 15.0  14.0  14.3   Hemoglobin 12.0 - 15.0 g/dL 11.9  12.0  11.8   Hematocrit 36.0 - 46.0 % 35.0  35.6  34.1   Platelets 150 - 400 K/uL 298  309  287        Latest Ref Rng & Units  05/12/2022    6:07 AM 05/11/2022    5:23 AM 05/10/2022    4:39 AM  CMP  Glucose 70 - 99 mg/dL 82  87  86   BUN 6 - 20 mg/dL 18  19  15    Creatinine 0.44 - 1.00 mg/dL 0.67  0.70  0.80   Sodium 135 - 145 mmol/L 135  134  135   Potassium 3.5 - 5.1 mmol/L 4.1  4.2  4.3   Chloride 98 - 111 mmol/L 107  105  103   CO2 22 - 32 mmol/L 21  22  22    Calcium 8.9 - 10.3 mg/dL 8.8  8.7  8.8   Total Protein 6.5 - 8.1 g/dL 5.9  5.8  5.9   Total Bilirubin 0.3 - 1.2 mg/dL 0.3  0.5  0.4   Alkaline Phos 38 - 126 U/L 89  90  88   AST 15 - 41 U/L 18  18  18    ALT 0 - 44 U/L 12  12  12      Imaging Studies:    [place] MFM UA Cord Doppler  Result Date: 05/08/2022 ----------------------------------------------------------------------  OBSTETRICS REPORT                       (Signed Final 05/08/2022 03:17 pm) ---------------------------------------------------------------------- Patient Info  ID #:       [id number]                          D.O.B.:  12-Nov-1990 (31 yrs)  Name:       Kim Miles              Visit Date: 05/08/2022 01:26 pm ---------------------------------------------------------------------- Performed By  Attending:        Sander Nephew      Ref. Address:     [address]                    [address]  Performed By:     Isac Sarna        [facility]  Referred By:      [facility] ---------------------------------------------------------------------- Orders  #  Description                           Code        Ordered By  1  [place] MFM UA CORD DOPPLER                76820.02    PEGGY CONSTANT  2  [place] MFM FETAL BPP WO NON               76819.01    PEGGY CONSTANT     STRESS ----------------------------------------------------------------------  #  Order #                     Accession #                Episode #  1  [id number]                    [id number]                 [id number]  2  [id number]                   [id number]                 [id number] ---------------------------------------------------------------------- Indications  Obesity complicating pregnancy, third          O99.213  trimester (nifedipine)  Diabetes - Pregestational,3rd trimester (diet  O24.313  control)  Hypertension - Chronic/Pre-existing            O10.019  [redacted] weeks gestation of pregnancy                Z3A.31  LR-NIPS/AFP-Neg/Horizon-Neg ---------------------------------------------------------------------- Fetal Evaluation  Num Of Fetuses:         1  Fetal Heart Rate(bpm):  145  Cardiac Activity:       Observed  Presentation:           Cephalic  Placenta:               Anterior  P. Cord Insertion:      Previously Visualized  Amniotic Fluid  AFI FV:      Within normal limits  AFI Sum(cm)     %Tile       Largest Pocket(cm)  14.96           53          5.31  RUQ(cm)       RLQ(cm)       LUQ(cm)        LLQ(cm)  3.31          3.58          5.31           2.76 ---------------------------------------------------------------------- Biophysical Evaluation  Amniotic F.V:   Within normal limits       F. Tone:  Observed  F. Movement:    Observed                   Score:          8/8  F. Breathing:   Observed ---------------------------------------------------------------------- Biometry  LV:        4.3  mm ---------------------------------------------------------------------- OB History  Gravidity:    1         Term:   0        Prem:   0        SAB:   0  TOP:          0       Ectopic:  0        Living: 0 ---------------------------------------------------------------------- Gestational Age  LMP:           31w 4d        Date:  09/29/21                  EDD:   07/06/22  Best:          31w 4d     Det. By:  LMP  (09/29/21)          EDD:   07/06/22 ---------------------------------------------------------------------- Anatomy  Ventricles:            Appears normal         Kidneys:                 Appear normal  Diaphragm:             Appears normal         Bladder:                Appears normal  Stomach:               Appears normal, left                         sided ---------------------------------------------------------------------- Doppler - Fetal Vessels  Umbilical Artery   S/D     %tile      RI    %tile      PI    %tile     PSV    ADFV    RDFV                                                     (cm/s)   5.76   > 97.5    0.83   > 97.5    1.52   > 97.5    28.16      No      No ---------------------------------------------------------------------- Cervix Uterus Adnexa  Cervix  Not visualized (advanced GA >24wks)  Uterus  No abnormality visualized.  Right Ovary  Not visualized.  Left Ovary  Not visualized.  Cul De Sac  No free fluid seen.  Adnexa  No abnormality visualized. ---------------------------------------------------------------------- Impression  Antenatal testing due to severe FGR  Biophysical profile 8/8 with good fetal movement and  amniotic fluid volume  UA Dopplers are elevated with no evidence of AEDF or  REDF. ---------------------------------------------------------------------- Recommendations  Continue 2x weekly  UA/BPP with daily NST. ----------------------------------------------------------------------              Sander Nephew, MD Electronically Signed Final Report   05/08/2022  03:17 pm ----------------------------------------------------------------------  [place] MFM FETAL BPP WO NON STRESS  Result Date: 05/08/2022 ----------------------------------------------------------------------  OBSTETRICS REPORT                       (Signed Final 05/08/2022 03:17 pm) ---------------------------------------------------------------------- Patient Info  ID #:       [id number]                          D.O.B.:  1991-10-06 (31 yrs)  Name:       Kim Miles              Visit Date: 05/08/2022 01:26 pm ---------------------------------------------------------------------- Performed By   Attending:        Lin Landsman      Ref. Address:     [address]                    [address]                                                             [address]  Performed By:     Isac Sarna        [facility]  Referred By:      [facility] ---------------------------------------------------------------------- Orders  #  Description                           Code        Ordered By  1  [place] MFM UA CORD DOPPLER                [phone]    PEGGY CONSTANT  2  [place] MFM FETAL BPP WO NON               [id number]    PEGGY CONSTANT     STRESS ----------------------------------------------------------------------  #  Order #                     Accession #                Episode #  1  [id number]                   [id number]                 [id number]  2  [id number]                   [id number]                 [id number] ---------------------------------------------------------------------- Indications  Obesity complicating pregnancy, third          O99.213  trimester (nifedipine)  Diabetes - Pregestational,3rd trimester (diet  O24.313  control)  Hypertension - Chronic/Pre-existing            O10.019  [redacted] weeks gestation of pregnancy                Z3A.31  LR-NIPS/AFP-Neg/Horizon-Neg ---------------------------------------------------------------------- Fetal Evaluation  Num Of Fetuses:         1  Fetal Heart Rate(bpm):  145  Cardiac Activity:       Observed  Presentation:           Cephalic  Placenta:               Anterior  P. Cord Insertion:      Previously Visualized  Amniotic Fluid  AFI FV:      Within normal limits  AFI Sum(cm)     %Tile       Largest Pocket(cm)  14.96           53          5.31  RUQ(cm)       RLQ(cm)       LUQ(cm)        LLQ(cm)  3.31          3.58          5.31           2.76 ---------------------------------------------------------------------- Biophysical Evaluation  Amniotic F.V:   Within normal limits        F. Tone:        Observed  F. Movement:    Observed                   Score:          8/8  F. Breathing:   Observed ---------------------------------------------------------------------- Biometry  LV:        4.3  mm ---------------------------------------------------------------------- OB History  Gravidity:    1         Term:   0        Prem:   0        SAB:   0  TOP:          0       Ectopic:  0        Living: 0 ---------------------------------------------------------------------- Gestational Age  LMP:           31w 4d        Date:  09/29/21                  EDD:   07/06/22  Best:          31w 4d     Det. By:  LMP  (09/29/21)          EDD:   07/06/22 ---------------------------------------------------------------------- Anatomy  Ventricles:            Appears normal         Kidneys:                Appear normal  Diaphragm:             Appears normal         Bladder:                Appears normal  Stomach:               Appears normal, left                         sided ---------------------------------------------------------------------- Doppler - Fetal Vessels  Umbilical Artery   S/D     %tile      RI    %  tile      PI    %tile     PSV    ADFV    RDFV                                                     (cm/s)   5.76   > 97.5    0.83   > 97.5    1.52   > 97.5    28.16      No      No ---------------------------------------------------------------------- Cervix Uterus Adnexa  Cervix  Not visualized (advanced GA >24wks)  Uterus  No abnormality visualized.  Right Ovary  Not visualized.  Left Ovary  Not visualized.  Cul De Sac  No free fluid seen.  Adnexa  No abnormality visualized. ---------------------------------------------------------------------- Impression  Antenatal testing due to severe FGR  Biophysical profile 8/8 with good fetal movement and  amniotic fluid volume  UA Dopplers are elevated with no evidence of AEDF or  REDF. ----------------------------------------------------------------------  Recommendations  Continue 2x weekly  UA/BPP with daily NST. ----------------------------------------------------------------------              Sander Nephew, MD Electronically Signed Final Report   05/08/2022 03:17 pm ----------------------------------------------------------------------    Medications:  Scheduled  aspirin EC  162 mg Oral Daily   docusate sodium  100 mg Oral Daily   escitalopram  10 mg Oral QHS   insulin aspart  0-14 Units Subcutaneous TID PC   labetalol  400 mg Oral TID   NIFEdipine  60 mg Oral BID   polyethylene glycol  17 g Oral Daily   prenatal multivitamin  1 tablet Oral Q1200   sodium chloride flush  3 mL Intravenous Q12H   I have reviewed the patient's current medications.  ASSESSMENT: G1P0 [redacted]w[redacted]d  Estimated Date of Delivery: 07/06/22  CHTN with severe superimposed pre eclampsia FGR, severe <1%, with IAEDF A1DM  PLAN: No change in plan >Labs all normal today with improved creatinine >Dopplers + BPP Tuesday/Fridays >Deliver at 34 weeks or as clinically indicated >Twice daily NSTs >S/P BMZ >S/P NICU consult >CBG monitoring  Florian Buff 05/12/2022,7:43 AM    Patient ID: Kim Miles, female   DOB: [birth year], 31 y.o.   MRN: [id number]

## 2022-05-13 ENCOUNTER — Ambulatory Visit: Payer: Managed Care, Other (non HMO)

## 2022-05-13 DIAGNOSIS — O1413 Severe pre-eclampsia, third trimester: Secondary | ICD-10-CM | POA: Diagnosis not present

## 2022-05-13 LAB — COMPREHENSIVE METABOLIC PANEL
ALT: 14 U/L (ref 0–44)
AST: 18 U/L (ref 15–41)
Albumin: 2.3 g/dL — ABNORMAL LOW (ref 3.5–5.0)
Alkaline Phosphatase: 85 U/L (ref 38–126)
Anion gap: 6 (ref 5–15)
BUN: 15 mg/dL (ref 6–20)
CO2: 22 mmol/L (ref 22–32)
Calcium: 8.7 mg/dL — ABNORMAL LOW (ref 8.9–10.3)
Chloride: 107 mmol/L (ref 98–111)
Creatinine, Ser: 0.68 mg/dL (ref 0.44–1.00)
GFR, Estimated: >60 mL/min (ref >60)
Glucose, Bld: 82 mg/dL (ref 70–99)
Potassium: 4 mmol/L (ref 3.5–5.1)
Sodium: 135 mmol/L (ref 135–145)
Total Bilirubin: 0.6 mg/dL (ref 0.3–1.2)
Total Protein: 5.7 g/dL — ABNORMAL LOW (ref 6.5–8.1)

## 2022-05-13 LAB — CBC
HCT: 34.4 % — ABNORMAL LOW (ref 36.0–46.0)
Hemoglobin: 11.6 g/dL — ABNORMAL LOW (ref 12.0–15.0)
MCH: 30.3 pg (ref 26.0–34.0)
MCHC: 33.7 g/dL (ref 30.0–36.0)
MCV: 89.8 fL (ref 80.0–100.0)
Platelets: 256 10*3/uL (ref 150–400)
RBC: 3.83 MIL/uL — ABNORMAL LOW (ref 3.87–5.11)
RDW: 13.4 % (ref 11.5–15.5)
WBC: 13.1 10*3/uL — ABNORMAL HIGH (ref 4.0–10.5)
nRBC: 0 % (ref 0.0–0.2)

## 2022-05-13 LAB — GLUCOSE, CAPILLARY
Glucose-Capillary: 84 mg/dL (ref 70–99)
Glucose-Capillary: 121 mg/dL — ABNORMAL HIGH (ref 70–99)
Glucose-Capillary: 105 mg/dL — ABNORMAL HIGH (ref 70–99)
Glucose-Capillary: 126 mg/dL — ABNORMAL HIGH (ref 70–99)
Glucose-Capillary: 104 mg/dL — ABNORMAL HIGH (ref 70–99)

## 2022-05-13 NOTE — Progress Notes (Signed)
Patient ID: Kim Miles, female   DOB: September 06, 1991, 31 y.o.   MRN: [id number] [facility]ULT[facility] PRACTICE ANTEPARTUM COMPREHENSIVE PROGRESS NOTE  Kim Miles is a 31 y.o. G1P0 at [redacted]w[redacted]d  who is admitted for [facility] with SIPEC, IUGR < 1 %, IAEDF and GDM  Fetal presentation is cephalic. Length of Stay:  8  Days  Subjective: Pt without complaints today Patient reports good fetal movement.  She reports no uterine contractions, no bleeding and no loss of fluid per vagina.  Vitals:  Blood pressure (!) 148/94, pulse 76, temperature 98 F (36.7 C), temperature source Oral, resp. rate 17, height 4\' 11"  (1.499 m), weight 89.8 kg, last menstrual period 09/29/2021, SpO2 99 %.  Physical Examination: Lungs clear Heart RRR Abd soft + BS gravid Ext non tender   Fetal Monitoring:  Baseline: 140-150's bpm, Variability: Good {> 6 bpm), Accelerations: Reactive, and Decelerations: Variable: mild  Labs:  Results for orders placed or performed during the hospital encounter of 05/05/22 (from the past 24 hour(s))  Glucose, capillary   Collection Time: 05/12/22  2:50 PM  Result Value Ref Range   Glucose-Capillary 114 (H) 70 - 99 mg/dL  Glucose, capillary   Collection Time: 05/12/22  8:44 PM  Result Value Ref Range   Glucose-Capillary 140 (H) 70 - 99 mg/dL   Comment 1 Notify RN   Glucose, capillary   Collection Time: 05/12/22 10:38 PM  Result Value Ref Range   Glucose-Capillary 99 70 - 99 mg/dL  CBC   Collection Time: 05/13/22  6:09 AM  Result Value Ref Range   WBC 13.1 (H) 4.0 - 10.5 K/uL   RBC 3.83 (L) 3.87 - 5.11 MIL/uL   Hemoglobin 11.6 (L) 12.0 - 15.0 g/dL   HCT 05/15/22 (L) 22.0 - 25.4 %   MCV 89.8 80.0 - 100.0 fL   MCH 30.3 26.0 - 34.0 pg   MCHC 33.7 30.0 - 36.0 g/dL   RDW 27.0 62.3 - 76.2 %   Platelets 256 150 - 400 K/uL   nRBC 0.0 0.0 - 0.2 %  Comprehensive metabolic panel   Collection Time: 05/13/22  6:09 AM  Result Value Ref Range   Sodium 135 135 - 145 mmol/L   Potassium 4.0 3.5 - 5.1  mmol/L   Chloride 107 98 - 111 mmol/L   CO2 22 22 - 32 mmol/L   Glucose, Bld 82 70 - 99 mg/dL   BUN 15 6 - 20 mg/dL   Creatinine, Ser 05/15/22 0.44 - 1.00 mg/dL   Calcium 8.7 (L) 8.9 - 10.3 mg/dL   Total Protein 5.7 (L) 6.5 - 8.1 g/dL   Albumin 2.3 (L) 3.5 - 5.0 g/dL   AST 18 15 - 41 U/L   ALT 14 0 - 44 U/L   Alkaline Phosphatase 85 38 - 126 U/L   Total Bilirubin 0.6 0.3 - 1.2 mg/dL   GFR, Estimated 5.17 >61 mL/min   Anion gap 6 5 - 15  Glucose, capillary   Collection Time: 05/13/22  6:15 AM  Result Value Ref Range   Glucose-Capillary 84 70 - 99 mg/dL  Glucose, capillary   Collection Time: 05/13/22 10:03 AM  Result Value Ref Range   Glucose-Capillary 121 (H) 70 - 99 mg/dL    Imaging Studies:    BPP and doppler studies yesterday   Medications:  Scheduled  aspirin EC  162 mg Oral Daily   docusate sodium  100 mg Oral Daily   escitalopram  10 mg Oral QHS  insulin aspart  0-14 Units Subcutaneous TID PC   labetalol  400 mg Oral TID   NIFEdipine  60 mg Oral BID   polyethylene glycol  17 g Oral Daily   prenatal multivitamin  1 tablet Oral Q1200   sodium chloride flush  3 mL Intravenous Q12H   I have reviewed the patient's current medications.  ASSESSMENT: IUP 33 2/7 weeks CHTN with SIPEC IUGR IAEDF GDM  PLAN: Stable. BP controlled. CBG's stable. Fetal well being reassuring. Delivery at 34 weeks or for maternal/fetal indications Continue routine antenatal care.   Hermina Staggers 05/13/2022,2:48 PM

## 2022-05-13 NOTE — Progress Notes (Addendum)
Initial Nutrition Assessment  DOCUMENTATION CODES:  Obesity unspecified  INTERVENTION:  CHO modified gestational diabetic diet w/ 3 snacks per day Double protein portions  NUTRITION DIAGNOSIS:   Increased nutrient needs related to  (pregnancy and fetal growth requirements) as evidenced by  (32 weeks IUP).  GOAL:   Patient will meet greater than or equal to 90% of their needs  MONITOR:   Weight trends  REASON FOR ASSESSMENT:   Antenatal, LOS    ASSESSMENT:   32 2/7 weeks IUP, adm with PEC, GDM. pre-preg weight 85.3 kg ( at [redacted] weeks GA )  BMI 37.9. weight up 4.5 kg.  s/p BMZ that mildly elevated serum glucose. delivery at 34 weeks planned    Diet Order:   Diet Order             Diet gestational carb mod Fluid consistency: Thin; Room service appropriate? Yes  Diet effective now                   EDUCATION NEEDS:   No education needs have been identified at this time (outpt GDM educ on 01/14/22)  Skin:  Skin Assessment: Reviewed RN Assessment     Height:   Ht Readings from Last 1 Encounters:  05/05/22 4\' 11"  (1.499 m)    Weight:   Wt Readings from Last 1 Encounters:  05/05/22 89.8 kg    Ideal Body Weight:   90-100 lbs  BMI:  Body mass index is 39.99 kg/m.  Estimated Nutritional Needs:   Kcal:  22-2400  Protein:  98-108 g  Fluid:  >2.2 L

## 2022-05-14 ENCOUNTER — Encounter: Payer: Managed Care, Other (non HMO) | Admitting: Obstetrics and Gynecology

## 2022-05-14 DIAGNOSIS — O1413 Severe pre-eclampsia, third trimester: Secondary | ICD-10-CM | POA: Diagnosis not present

## 2022-05-14 DIAGNOSIS — O24414 Gestational diabetes mellitus in pregnancy, insulin controlled: Secondary | ICD-10-CM | POA: Diagnosis not present

## 2022-05-14 DIAGNOSIS — Z3A32 32 weeks gestation of pregnancy: Secondary | ICD-10-CM | POA: Diagnosis not present

## 2022-05-14 DIAGNOSIS — O365930 Maternal care for other known or suspected poor fetal growth, third trimester, not applicable or unspecified: Secondary | ICD-10-CM | POA: Diagnosis not present

## 2022-05-14 LAB — COMPREHENSIVE METABOLIC PANEL
ALT: 14 U/L (ref 0–44)
AST: 20 U/L (ref 15–41)
Albumin: 2.4 g/dL — ABNORMAL LOW (ref 3.5–5.0)
Alkaline Phosphatase: 87 U/L (ref 38–126)
Anion gap: 7 (ref 5–15)
BUN: 15 mg/dL (ref 6–20)
CO2: 20 mmol/L — ABNORMAL LOW (ref 22–32)
Calcium: 8.7 mg/dL — ABNORMAL LOW (ref 8.9–10.3)
Chloride: 107 mmol/L (ref 98–111)
Creatinine, Ser: 0.6 mg/dL (ref 0.44–1.00)
GFR, Estimated: >60 mL/min (ref >60)
Glucose, Bld: 89 mg/dL (ref 70–99)
Potassium: 4.1 mmol/L (ref 3.5–5.1)
Sodium: 134 mmol/L — ABNORMAL LOW (ref 135–145)
Total Bilirubin: 0.1 mg/dL — ABNORMAL LOW (ref 0.3–1.2)
Total Protein: 5.9 g/dL — ABNORMAL LOW (ref 6.5–8.1)

## 2022-05-14 LAB — GLUCOSE, CAPILLARY
Glucose-Capillary: 81 mg/dL (ref 70–99)
Glucose-Capillary: 115 mg/dL — ABNORMAL HIGH (ref 70–99)
Glucose-Capillary: 104 mg/dL — ABNORMAL HIGH (ref 70–99)
Glucose-Capillary: 98 mg/dL (ref 70–99)

## 2022-05-14 LAB — CBC
HCT: 35.2 % — ABNORMAL LOW (ref 36.0–46.0)
Hemoglobin: 11.8 g/dL — ABNORMAL LOW (ref 12.0–15.0)
MCH: 30.6 pg (ref 26.0–34.0)
MCHC: 33.5 g/dL (ref 30.0–36.0)
MCV: 91.2 fL (ref 80.0–100.0)
Platelets: 262 10*3/uL (ref 150–400)
RBC: 3.86 MIL/uL — ABNORMAL LOW (ref 3.87–5.11)
RDW: 13.5 % (ref 11.5–15.5)
WBC: 13.3 10*3/uL — ABNORMAL HIGH (ref 4.0–10.5)
nRBC: 0 % (ref 0.0–0.2)

## 2022-05-14 MED ORDER — SALINE SPRAY 0.65 % NA SOLN
1.0000 | NASAL | Status: DC | PRN
  Administered 2022-05-14: 1 via NASAL
  Filled 2022-05-14: qty 44

## 2022-05-14 MED ORDER — LACTATED RINGERS IV SOLN: INTRAVENOUS | Status: DC

## 2022-05-14 MED ORDER — LACTATED RINGERS IV BOLUS
500.0000 mL | Freq: Once | INTRAVENOUS | Status: AC
  Administered 2022-05-14: 500 mL via INTRAVENOUS

## 2022-05-14 NOTE — Progress Notes (Signed)
Patient ID: Arionne Iams, female   DOB: May 27, 1991, 31 y.o.   MRN: [id number] ACULTY PRACTICE ANTEPARTUM COMPREHENSIVE PROGRESS NOTE  Tanyah Debruyne is a 31 y.o. G1P0 at [redacted]w[redacted]d  who is admitted for [facility] with SIPEC, IUGR < 1 %, IAEDF and GDM .   Fetal presentation is cephalic. Length of Stay:  9  Days  Subjective: Pt reports having a nose bleed earlier this morning. Patient reports good fetal movement.  She reports no uterine contractions, no bleeding and no loss of fluid per vagina.  Vitals:  Blood pressure 125/87, pulse 75, temperature 98.2 F (36.8 C), temperature source Oral, resp. rate 18, height 4\' 11"  (1.499 m), weight 89.8 kg, last menstrual period 09/29/2021, SpO2 100 %. Physical Examination: Nose: no active bleeding noted, irritated turbinates Lungs clear Heart RRR Abd soft + BS gravid  Ext non tender  Fetal Monitoring:  Baseline: 140-150's bpm, Variability: Fair (1-6 bpm), Accelerations: Reactive, and Decelerations: Variable: mild  Labs:  Results for orders placed or performed during the hospital encounter of 05/05/22 (from the past 24 hour(s))  Glucose, capillary   Collection Time: 05/13/22  4:14 PM  Result Value Ref Range   Glucose-Capillary 105 (H) 70 - 99 mg/dL  Glucose, capillary   Collection Time: 05/13/22  9:09 PM  Result Value Ref Range   Glucose-Capillary 126 (H) 70 - 99 mg/dL  Glucose, capillary   Collection Time: 05/13/22 10:12 PM  Result Value Ref Range   Glucose-Capillary 104 (H) 70 - 99 mg/dL  CBC   Collection Time: 05/14/22  4:09 AM  Result Value Ref Range   WBC 13.3 (H) 4.0 - 10.5 K/uL   RBC 3.86 (L) 3.87 - 5.11 MIL/uL   Hemoglobin 11.8 (L) 12.0 - 15.0 g/dL   HCT 05/16/22 (L) 22.9 - 79.8 %   MCV 91.2 80.0 - 100.0 fL   MCH 30.6 26.0 - 34.0 pg   MCHC 33.5 30.0 - 36.0 g/dL   RDW 92.1 19.4 - 17.4 %   Platelets 262 150 - 400 K/uL   nRBC 0.0 0.0 - 0.2 %  Comprehensive metabolic panel   Collection Time: 05/14/22  4:09 AM  Result Value Ref Range    Sodium 134 (L) 135 - 145 mmol/L   Potassium 4.1 3.5 - 5.1 mmol/L   Chloride 107 98 - 111 mmol/L   CO2 20 (L) 22 - 32 mmol/L   Glucose, Bld 89 70 - 99 mg/dL   BUN 15 6 - 20 mg/dL   Creatinine, Ser 05/16/22 0.44 - 1.00 mg/dL   Calcium 8.7 (L) 8.9 - 10.3 mg/dL   Total Protein 5.9 (L) 6.5 - 8.1 g/dL   Albumin 2.4 (L) 3.5 - 5.0 g/dL   AST 20 15 - 41 U/L   ALT 14 0 - 44 U/L   Alkaline Phosphatase 87 38 - 126 U/L   Total Bilirubin 0.1 (L) 0.3 - 1.2 mg/dL   GFR, Estimated 4.48 >18 mL/min   Anion gap 7 5 - 15  Glucose, capillary   Collection Time: 05/14/22  4:13 AM  Result Value Ref Range   Glucose-Capillary 81 70 - 99 mg/dL  Glucose, capillary   Collection Time: 05/14/22 12:19 PM  Result Value Ref Range   Glucose-Capillary 115 (H) 70 - 99 mg/dL    Imaging Studies:    NA   Medications:  Scheduled  aspirin EC  162 mg Oral Daily   docusate sodium  100 mg Oral Daily   escitalopram  10 mg  Oral QHS   insulin aspart  0-14 Units Subcutaneous TID PC   labetalol  400 mg Oral TID   NIFEdipine  60 mg Oral BID   polyethylene glycol  17 g Oral Daily   prenatal multivitamin  1 tablet Oral Q1200   sodium chloride flush  3 mL Intravenous Q12H   I have reviewed the patient's current medications.  ASSESSMENT: IUP 32 3/7 weeks CHTN with SPEC, S/P mag and BMZ IUGR IAEDF GDM Nose bleeds  PLAN: Stable. BP stable. CBG's stable. Nasal saline spray PRN. Vasolin gel PRN. Fetal well being reassuring. Continue with twice weekly antenatal testing. Delivery at 34 weeks or for maternal/fetal indications Continue routine antenatal care.   Hermina Staggers 05/14/2022,2:52 PM

## 2022-05-14 NOTE — Plan of Care (Signed)
Problem: Education: Goal: Ability to describe self-care measures that may prevent or decrease complications (Diabetes Survival Skills Education) will improve Outcome: Progressing Goal: Individualized Educational Video(s) Outcome: Progressing   Problem: Coping: Goal: Ability to adjust to condition or change in health will improve Outcome: Progressing   Problem: Fluid Volume: Goal: Ability to maintain a balanced intake and output will improve Outcome: Progressing   Problem: Health Behavior/Discharge Planning: Goal: Ability to identify and utilize available resources and services will improve Outcome: Progressing Goal: Ability to manage health-related needs will improve Outcome: Progressing   Problem: Metabolic: Goal: Ability to maintain appropriate glucose levels will improve Outcome: Progressing   Problem: Nutritional: Goal: Maintenance of adequate nutrition will improve Outcome: Progressing Goal: Progress toward achieving an optimal weight will improve Outcome: Progressing   Problem: Skin Integrity: Goal: Risk for impaired skin integrity will decrease Outcome: Progressing   Problem: Tissue Perfusion: Goal: Adequacy of tissue perfusion will improve Outcome: Progressing   Problem: Education: Goal: Knowledge of disease or condition will improve Outcome: Progressing Goal: Knowledge of the prescribed therapeutic regimen will improve Outcome: Progressing Goal: Individualized Educational Video(s) Outcome: Progressing   Problem: Clinical Measurements: Goal: Complications related to the disease process, condition or treatment will be avoided or minimized Outcome: Progressing   Problem: Education: Goal: Knowledge of General Education information will improve Description: Including pain rating scale, medication(s)/side effects and non-pharmacologic comfort measures Outcome: Progressing   Problem: Health Behavior/Discharge Planning: Goal: Ability to manage health-related  needs will improve Outcome: Progressing   Problem: Clinical Measurements: Goal: Ability to maintain clinical measurements within normal limits will improve Outcome: Progressing Goal: Will remain free from infection Outcome: Progressing Goal: Diagnostic test results will improve Outcome: Progressing Goal: Respiratory complications will improve Outcome: Progressing Goal: Cardiovascular complication will be avoided Outcome: Progressing   Problem: Activity: Goal: Risk for activity intolerance will decrease Outcome: Progressing   Problem: Nutrition: Goal: Adequate nutrition will be maintained Outcome: Progressing   Problem: Coping: Goal: Level of anxiety will decrease Outcome: Progressing   Problem: Elimination: Goal: Will not experience complications related to bowel motility Outcome: Progressing Goal: Will not experience complications related to urinary retention Outcome: Progressing   Problem: Pain Managment: Goal: General experience of comfort will improve Outcome: Progressing   Problem: Safety: Goal: Ability to remain free from injury will improve Outcome: Progressing   Problem: Skin Integrity: Goal: Risk for impaired skin integrity will decrease Outcome: Progressing   Problem: Education: Goal: Knowledge of disease or condition will improve Outcome: Progressing Goal: Knowledge of the prescribed therapeutic regimen will improve Outcome: Progressing Goal: Individualized Educational Video(s) Outcome: Progressing   Problem: Clinical Measurements: Goal: Complications related to the disease process, condition or treatment will be avoided or minimized Outcome: Progressing   Problem: Education: Goal: Ability to describe self-care measures that may prevent or decrease complications (Diabetes Survival Skills Education) will improve Outcome: Progressing Goal: Individualized Educational Video(s) Outcome: Progressing   Problem: Coping: Goal: Ability to adjust to  condition or change in health will improve Outcome: Progressing   Problem: Fluid Volume: Goal: Ability to maintain a balanced intake and output will improve Outcome: Progressing   Problem: Health Behavior/Discharge Planning: Goal: Ability to identify and utilize available resources and services will improve Outcome: Progressing Goal: Ability to manage health-related needs will improve Outcome: Progressing   Problem: Metabolic: Goal: Ability to maintain appropriate glucose levels will improve Outcome: Progressing   Problem: Nutritional: Goal: Maintenance of adequate nutrition will improve Outcome: Progressing Goal: Progress toward achieving an optimal weight will improve  Outcome: Progressing   Problem: Skin Integrity: Goal: Risk for impaired skin integrity will decrease Outcome: Progressing   Problem: Tissue Perfusion: Goal: Adequacy of tissue perfusion will improve Outcome: Progressing

## 2022-05-15 ENCOUNTER — Inpatient Hospital Stay (HOSPITAL_COMMUNITY): Payer: Managed Care, Other (non HMO)

## 2022-05-15 DIAGNOSIS — E669 Obesity, unspecified: Secondary | ICD-10-CM

## 2022-05-15 DIAGNOSIS — O99213 Obesity complicating pregnancy, third trimester: Secondary | ICD-10-CM

## 2022-05-15 DIAGNOSIS — Z3A32 32 weeks gestation of pregnancy: Secondary | ICD-10-CM

## 2022-05-15 DIAGNOSIS — O24414 Gestational diabetes mellitus in pregnancy, insulin controlled: Secondary | ICD-10-CM | POA: Diagnosis not present

## 2022-05-15 DIAGNOSIS — O1413 Severe pre-eclampsia, third trimester: Secondary | ICD-10-CM | POA: Diagnosis not present

## 2022-05-15 DIAGNOSIS — O365930 Maternal care for other known or suspected poor fetal growth, third trimester, not applicable or unspecified: Secondary | ICD-10-CM

## 2022-05-15 DIAGNOSIS — O24313 Unspecified pre-existing diabetes mellitus in pregnancy, third trimester: Secondary | ICD-10-CM

## 2022-05-15 DIAGNOSIS — O10013 Pre-existing essential hypertension complicating pregnancy, third trimester: Secondary | ICD-10-CM

## 2022-05-15 LAB — CBC
HCT: 32.9 % — ABNORMAL LOW (ref 36.0–46.0)
Hemoglobin: 11.3 g/dL — ABNORMAL LOW (ref 12.0–15.0)
MCH: 30.8 pg (ref 26.0–34.0)
MCHC: 34.3 g/dL (ref 30.0–36.0)
MCV: 89.6 fL (ref 80.0–100.0)
Platelets: 236 10*3/uL (ref 150–400)
RBC: 3.67 MIL/uL — ABNORMAL LOW (ref 3.87–5.11)
RDW: 13.5 % (ref 11.5–15.5)
WBC: 12.6 10*3/uL — ABNORMAL HIGH (ref 4.0–10.5)
nRBC: 0 % (ref 0.0–0.2)

## 2022-05-15 LAB — GLUCOSE, CAPILLARY
Glucose-Capillary: 88 mg/dL (ref 70–99)
Glucose-Capillary: 99 mg/dL (ref 70–99)
Glucose-Capillary: 100 mg/dL — ABNORMAL HIGH (ref 70–99)
Glucose-Capillary: 107 mg/dL — ABNORMAL HIGH (ref 70–99)
Glucose-Capillary: 93 mg/dL (ref 70–99)

## 2022-05-15 LAB — TYPE AND SCREEN
ABO/RH(D): A POS
Antibody Screen: NEGATIVE

## 2022-05-15 NOTE — Progress Notes (Signed)
Patient ID: Kim Miles, female   DOB: May 15, 1991, 31 y.o.   MRN: [id number] ACULTY PRACTICE ANTEPARTUM COMPREHENSIVE PROGRESS NOTE  Kim Miles is a 31 y.o. G1P0 at [redacted]w[redacted]d  who is admitted for [facility] with SIPEC, IUGR < 1 % and GDM .   Fetal presentation is cephalic. Length of Stay:  10  Days  Subjective: Pt has no complaints this morning. Patient reports good fetal movement.  She reports no uterine contractions, no bleeding and no loss of fluid per vagina.  Vitals:  Blood pressure (!) 137/94, pulse 74, temperature 98 F (36.7 C), temperature source Oral, resp. rate 16, height 4\' 11"  (1.499 m), weight 89.8 kg, last menstrual period 09/29/2021, SpO2 99 %.  Physical Examination: Lungs clear Heart RRR Abd soft + BS gravid Ext non tender   Fetal Monitoring:  Baseline: 140's bpm, Variability: Fair (1-6 bpm), Accelerations: Reactive, and Decelerations: Variable: mild  Labs:  Results for orders placed or performed during the hospital encounter of 05/05/22 (from the past 24 hour(s))  Glucose, capillary   Collection Time: 05/14/22 12:19 PM  Result Value Ref Range   Glucose-Capillary 115 (H) 70 - 99 mg/dL  Glucose, capillary   Collection Time: 05/14/22  6:58 PM  Result Value Ref Range   Glucose-Capillary 104 (H) 70 - 99 mg/dL  Glucose, capillary   Collection Time: 05/14/22 10:07 PM  Result Value Ref Range   Glucose-Capillary 98 70 - 99 mg/dL  CBC   Collection Time: 05/15/22  5:30 AM  Result Value Ref Range   WBC 12.6 (H) 4.0 - 10.5 K/uL   RBC 3.67 (L) 3.87 - 5.11 MIL/uL   Hemoglobin 11.3 (L) 12.0 - 15.0 g/dL   HCT 05/17/22 (L) 62.6 - 94.8 %   MCV 89.6 80.0 - 100.0 fL   MCH 30.8 26.0 - 34.0 pg   MCHC 34.3 30.0 - 36.0 g/dL   RDW 54.6 27.0 - 35.0 %   Platelets 236 150 - 400 K/uL   nRBC 0.0 0.0 - 0.2 %  Type and screen [facility]   Collection Time: 05/15/22  5:30 AM  Result Value Ref Range   ABO/RH(D) A POS    Antibody Screen NEG    Sample Expiration       05/18/2022,2359 Performed at [facility], [address] [place], [place]   Glucose, capillary   Collection Time: 05/15/22  5:33 AM  Result Value Ref Range   Glucose-Capillary 88 70 - 99 mg/dL    Imaging Studies:    NA   Medications:  Scheduled  aspirin EC  162 mg Oral Daily   docusate sodium  100 mg Oral Daily   escitalopram  10 mg Oral QHS   insulin aspart  0-14 Units Subcutaneous TID PC   labetalol  400 mg Oral TID   NIFEdipine  60 mg Oral BID   polyethylene glycol  17 g Oral Daily   prenatal multivitamin  1 tablet Oral Q1200   sodium chloride flush  3 mL Intravenous Q12H   I have reviewed the patient's current medications.  ASSESSMENT: IUP 32 4/7 weeks CHTN with SPEC,  s/p mag and BMZ IUGR IAEDF GDM Nose bleeds, improved  PLAN: Stable, BP and CBG stable. UA doppler studies today. Fetal well being reassuring. Delivery at 34 weeks or for maternal/fetal indications Continue routine antenatal care.   6/7 05/15/2022,9:17 AM

## 2022-05-15 NOTE — Plan of Care (Signed)
Problem: Coping: Goal: Ability to adjust to condition or change in health will improve Outcome: Progressing   Problem: Fluid Volume: Goal: Ability to maintain a balanced intake and output will improve Outcome: Progressing   Problem: Health Behavior/Discharge Planning: Goal: Ability to identify and utilize available resources and services will improve Outcome: Progressing Goal: Ability to manage health-related needs will improve Outcome: Progressing   Problem: Skin Integrity: Goal: Risk for impaired skin integrity will decrease Outcome: Progressing   Problem: Tissue Perfusion: Goal: Adequacy of tissue perfusion will improve Outcome: Progressing   Problem: Education: Goal: Knowledge of disease or condition will improve Outcome: Progressing Goal: Knowledge of the prescribed therapeutic regimen will improve Outcome: Progressing Goal: Individualized Educational Video(s) Outcome: Progressing   Problem: Clinical Measurements: Goal: Complications related to the disease process, condition or treatment will be avoided or minimized Outcome: Progressing   Problem: Education: Goal: Knowledge of General Education information will improve Description: Including pain rating scale, medication(s)/side effects and non-pharmacologic comfort measures Outcome: Progressing   Problem: Health Behavior/Discharge Planning: Goal: Ability to manage health-related needs will improve Outcome: Progressing   Problem: Clinical Measurements: Goal: Ability to maintain clinical measurements within normal limits will improve Outcome: Progressing Goal: Will remain free from infection Outcome: Progressing Goal: Diagnostic test results will improve Outcome: Progressing Goal: Respiratory complications will improve Outcome: Progressing Goal: Cardiovascular complication will be avoided Outcome: Progressing   Problem: Nutrition: Goal: Adequate nutrition will be maintained Outcome: Progressing    Problem: Coping: Goal: Level of anxiety will decrease Outcome: Progressing   Problem: Pain Managment: Goal: General experience of comfort will improve Outcome: Progressing   Problem: Safety: Goal: Ability to remain free from injury will improve Outcome: Progressing   Problem: Skin Integrity: Goal: Risk for impaired skin integrity will decrease Outcome: Progressing   Problem: Education: Goal: Knowledge of disease or condition will improve Outcome: Progressing Goal: Knowledge of the prescribed therapeutic regimen will improve Outcome: Progressing   Problem: Fluid Volume: Goal: Peripheral tissue perfusion will improve Outcome: Progressing   Problem: Clinical Measurements: Goal: Complications related to disease process, condition or treatment will be avoided or minimized Outcome: Progressing   Problem: Education: Goal: Knowledge of disease or condition will improve Outcome: Progressing Goal: Knowledge of the prescribed therapeutic regimen will improve Outcome: Progressing Goal: Individualized Educational Video(s) Outcome: Progressing   Problem: Clinical Measurements: Goal: Complications related to the disease process, condition or treatment will be avoided or minimized Outcome: Progressing   Problem: Education: Goal: Knowledge of General Education information will improve Description: Including pain rating scale, medication(s)/side effects and non-pharmacologic comfort measures Outcome: Progressing   Problem: Health Behavior/Discharge Planning: Goal: Ability to manage health-related needs will improve Outcome: Progressing   Problem: Clinical Measurements: Goal: Ability to maintain clinical measurements within normal limits will improve Outcome: Progressing Goal: Will remain free from infection Outcome: Progressing Goal: Diagnostic test results will improve Outcome: Progressing Goal: Respiratory complications will improve Outcome: Progressing Goal:  Cardiovascular complication will be avoided Outcome: Progressing   Problem: Activity: Goal: Risk for activity intolerance will decrease Outcome: Progressing   Problem: Nutrition: Goal: Adequate nutrition will be maintained Outcome: Progressing   Problem: Coping: Goal: Level of anxiety will decrease Outcome: Progressing   Problem: Elimination: Goal: Will not experience complications related to bowel motility Outcome: Progressing Goal: Will not experience complications related to urinary retention Outcome: Progressing   Problem: Pain Managment: Goal: General experience of comfort will improve Outcome: Progressing   Problem: Safety: Goal: Ability to remain free from injury will improve Outcome: Progressing  Problem: Skin Integrity: Goal: Risk for impaired skin integrity will decrease Outcome: Progressing   Problem: Education: Goal: Ability to describe self-care measures that may prevent or decrease complications (Diabetes Survival Skills Education) will improve Outcome: Progressing Goal: Individualized Educational Video(s) Outcome: Progressing   Problem: Coping: Goal: Ability to adjust to condition or change in health will improve Outcome: Progressing   Problem: Fluid Volume: Goal: Ability to maintain a balanced intake and output will improve Outcome: Progressing   Problem: Health Behavior/Discharge Planning: Goal: Ability to identify and utilize available resources and services will improve Outcome: Progressing Goal: Ability to manage health-related needs will improve Outcome: Progressing   Problem: Metabolic: Goal: Ability to maintain appropriate glucose levels will improve Outcome: Progressing   Problem: Skin Integrity: Goal: Risk for impaired skin integrity will decrease Outcome: Progressing   Problem: Tissue Perfusion: Goal: Adequacy of tissue perfusion will improve Outcome: Progressing

## 2022-05-16 DIAGNOSIS — O1413 Severe pre-eclampsia, third trimester: Secondary | ICD-10-CM | POA: Diagnosis not present

## 2022-05-16 DIAGNOSIS — O24414 Gestational diabetes mellitus in pregnancy, insulin controlled: Secondary | ICD-10-CM | POA: Diagnosis not present

## 2022-05-16 DIAGNOSIS — Z3A32 32 weeks gestation of pregnancy: Secondary | ICD-10-CM | POA: Diagnosis not present

## 2022-05-16 DIAGNOSIS — O365930 Maternal care for other known or suspected poor fetal growth, third trimester, not applicable or unspecified: Secondary | ICD-10-CM | POA: Diagnosis not present

## 2022-05-16 LAB — GLUCOSE, CAPILLARY
Glucose-Capillary: 82 mg/dL (ref 70–99)
Glucose-Capillary: 100 mg/dL — ABNORMAL HIGH (ref 70–99)
Glucose-Capillary: 106 mg/dL — ABNORMAL HIGH (ref 70–99)
Glucose-Capillary: 111 mg/dL — ABNORMAL HIGH (ref 70–99)

## 2022-05-16 NOTE — Progress Notes (Signed)
Patient ID: Kim Miles, female   DOB: 03/28/1991, 31 y.o.   MRN: [id number] ACULTY PRACTICE ANTEPARTUM COMPREHENSIVE PROGRESS NOTE  Kim Miles is a 31 y.o. G1P0 at [redacted]w[redacted]d  who is admitted for [facility] with SIPEC, IUGR < 1% and GDM.   Fetal presentation is cephalic. Length of Stay:  11  Days  Subjective: Pt has no complaints this morning. Denies HA or visual changes. No more nosed bleeds Patient reports good fetal movement.  She reports no uterine contractions, no bleeding and no loss of fluid per vagina.  Vitals:  Blood pressure 133/87, pulse 76, temperature 98.3 F (36.8 C), temperature source Oral, resp. rate 16, height 4\' 11"  (1.499 m), weight 89.8 kg, last menstrual period 09/29/2021, SpO2 98 %.  Physical Examination: Lungs clear Heart RRR Abd soft + BS gravid Ext non tender  Fetal Monitoring:  Baseline: 140-150's bpm, Variability: Good {> 6 bpm), Accelerations: Reactive, and Decelerations: Variable: mild  Labs:  Results for orders placed or performed during the hospital encounter of 05/05/22 (from the past 24 hour(s))  Glucose, capillary   Collection Time: 05/15/22 11:56 AM  Result Value Ref Range   Glucose-Capillary 99 70 - 99 mg/dL  Glucose, capillary   Collection Time: 05/15/22  2:51 PM  Result Value Ref Range   Glucose-Capillary 100 (H) 70 - 99 mg/dL  Glucose, capillary   Collection Time: 05/15/22  8:50 PM  Result Value Ref Range   Glucose-Capillary 107 (H) 70 - 99 mg/dL  Glucose, capillary   Collection Time: 05/15/22 10:36 PM  Result Value Ref Range   Glucose-Capillary 93 70 - 99 mg/dL  Glucose, capillary   Collection Time: 05/16/22  4:32 AM  Result Value Ref Range   Glucose-Capillary 82 70 - 99 mg/dL    Imaging Studies:    NA   Medications:  Scheduled  aspirin EC  162 mg Oral Daily   docusate sodium  100 mg Oral Daily   escitalopram  10 mg Oral QHS   insulin aspart  0-14 Units Subcutaneous TID PC   labetalol  400 mg Oral TID   NIFEdipine  60  mg Oral BID   polyethylene glycol  17 g Oral Daily   prenatal multivitamin  1 tablet Oral Q1200   sodium chloride flush  3 mL Intravenous Q12H   I have reviewed the patient's current medications.  ASSESSMENT: IUP 32 5/7 CHTN with SIPEC, s/p Mag and BMZ IUGR IAEDF GDM Nose Bleeds, resolved  PLAN: BP and CBG's stable. UA dopplers stable. BPP 8/8 yesterday. Fetal well being reassuring. Continue with twice weekly antenatal testing. Delivery at 34 weeks or for maternal/fetal indications Continue routine antenatal care.   7/7 05/16/2022,8:51 AM

## 2022-05-17 DIAGNOSIS — O365930 Maternal care for other known or suspected poor fetal growth, third trimester, not applicable or unspecified: Secondary | ICD-10-CM | POA: Diagnosis not present

## 2022-05-17 DIAGNOSIS — O1413 Severe pre-eclampsia, third trimester: Secondary | ICD-10-CM | POA: Diagnosis not present

## 2022-05-17 DIAGNOSIS — Z3A32 32 weeks gestation of pregnancy: Secondary | ICD-10-CM | POA: Diagnosis not present

## 2022-05-17 DIAGNOSIS — O24414 Gestational diabetes mellitus in pregnancy, insulin controlled: Secondary | ICD-10-CM | POA: Diagnosis not present

## 2022-05-17 LAB — GLUCOSE, CAPILLARY
Glucose-Capillary: 77 mg/dL (ref 70–99)
Glucose-Capillary: 128 mg/dL — ABNORMAL HIGH (ref 70–99)
Glucose-Capillary: 114 mg/dL — ABNORMAL HIGH (ref 70–99)
Glucose-Capillary: 103 mg/dL — ABNORMAL HIGH (ref 70–99)

## 2022-05-17 LAB — TYPE AND SCREEN
ABO/RH(D): A POS
Antibody Screen: NEGATIVE

## 2022-05-17 LAB — COMPREHENSIVE METABOLIC PANEL
ALT: 16 U/L (ref 0–44)
AST: 22 U/L (ref 15–41)
Albumin: 2.5 g/dL — ABNORMAL LOW (ref 3.5–5.0)
Alkaline Phosphatase: 90 U/L (ref 38–126)
Anion gap: 7 (ref 5–15)
BUN: 13 mg/dL (ref 6–20)
CO2: 21 mmol/L — ABNORMAL LOW (ref 22–32)
Calcium: 9 mg/dL (ref 8.9–10.3)
Chloride: 108 mmol/L (ref 98–111)
Creatinine, Ser: 0.73 mg/dL (ref 0.44–1.00)
GFR, Estimated: >60 mL/min (ref >60)
Glucose, Bld: 114 mg/dL — ABNORMAL HIGH (ref 70–99)
Potassium: 3.9 mmol/L (ref 3.5–5.1)
Sodium: 136 mmol/L (ref 135–145)
Total Bilirubin: 0.1 mg/dL — ABNORMAL LOW (ref 0.3–1.2)
Total Protein: 6.2 g/dL — ABNORMAL LOW (ref 6.5–8.1)

## 2022-05-17 LAB — CBC
HCT: 33.6 % — ABNORMAL LOW (ref 36.0–46.0)
Hemoglobin: 11.6 g/dL — ABNORMAL LOW (ref 12.0–15.0)
MCH: 30.8 pg (ref 26.0–34.0)
MCHC: 34.5 g/dL (ref 30.0–36.0)
MCV: 89.1 fL (ref 80.0–100.0)
Platelets: 261 10*3/uL (ref 150–400)
RBC: 3.77 MIL/uL — ABNORMAL LOW (ref 3.87–5.11)
RDW: 13.6 % (ref 11.5–15.5)
WBC: 13.7 10*3/uL — ABNORMAL HIGH (ref 4.0–10.5)
nRBC: 0 % (ref 0.0–0.2)

## 2022-05-17 MED ORDER — BENZOCAINE-MENTHOL 20-0.5 % EX AERO: 1.0000 | INHALATION_SPRAY | CUTANEOUS | Status: DC | PRN

## 2022-05-17 MED ORDER — SIMETHICONE 80 MG PO CHEW: 80.0000 mg | CHEWABLE_TABLET | ORAL | Status: DC | PRN

## 2022-05-17 MED ORDER — SENNOSIDES-DOCUSATE SODIUM 8.6-50 MG PO TABS: 2.0000 | ORAL_TABLET | Freq: Every day | ORAL | Status: DC

## 2022-05-17 MED ORDER — ONDANSETRON HCL 4 MG PO TABS: 4.0000 mg | ORAL_TABLET | ORAL | Status: DC | PRN

## 2022-05-17 MED ORDER — IBUPROFEN 600 MG PO TABS: 600.0000 mg | ORAL_TABLET | Freq: Four times a day (QID) | ORAL | Status: DC

## 2022-05-17 MED ORDER — PRENATAL MULTIVITAMIN CH: 1.0000 | ORAL_TABLET | Freq: Every day | ORAL | Status: DC

## 2022-05-17 MED ORDER — TETANUS-DIPHTH-ACELL PERTUSSIS 5-2.5-18.5 LF-MCG/0.5 IM SUSY: 0.5000 mL | PREFILLED_SYRINGE | Freq: Once | INTRAMUSCULAR | Status: DC

## 2022-05-17 MED ORDER — ACETAMINOPHEN 325 MG PO TABS
650.0000 mg | ORAL_TABLET | ORAL | Status: DC | PRN
Start: 2022-05-17 — End: 2022-05-17

## 2022-05-17 MED ORDER — DIBUCAINE (PERIANAL) 1 % EX OINT: 1.0000 | TOPICAL_OINTMENT | CUTANEOUS | Status: DC | PRN

## 2022-05-17 MED ORDER — ONDANSETRON HCL 4 MG/2ML IJ SOLN: 4.0000 mg | INTRAMUSCULAR | Status: DC | PRN

## 2022-05-17 MED ORDER — DIPHENHYDRAMINE HCL 25 MG PO CAPS: 25.0000 mg | ORAL_CAPSULE | Freq: Four times a day (QID) | ORAL | Status: DC | PRN

## 2022-05-17 MED ORDER — ZOLPIDEM TARTRATE 5 MG PO TABS: 5.0000 mg | ORAL_TABLET | Freq: Every evening | ORAL | Status: DC | PRN

## 2022-05-17 MED ORDER — WITCH HAZEL-GLYCERIN EX PADS: 1.0000 | MEDICATED_PAD | CUTANEOUS | Status: DC | PRN

## 2022-05-17 MED ORDER — COCONUT OIL OIL: 1.0000 | TOPICAL_OIL | Status: DC | PRN

## 2022-05-17 NOTE — Progress Notes (Signed)
MD Ozan requested continuous monitoring for the duration of the night. RN explained the reasons why and risk to patient. Patient refused monitoring and requested it be done in the AM. MD informed of this and approved monitoring in the Am.

## 2022-05-17 NOTE — Progress Notes (Signed)
Patient ID: Kim Miles, female   DOB: 1991-06-13, 31 y.o.   MRN: [id number] ACULTY PRACTICE ANTEPARTUM COMPREHENSIVE PROGRESS NOTE  Nance Mccombs is a 31 y.o. G1P0 at [redacted]w[redacted]d  who is admitted for [facility] with  SIPEC, IUGR < 1 % and GDM .   Fetal presentation is cephalic. Length of Stay:  12  Days  Subjective: Pt has not complaints this morning Patient reports good fetal movement.  She reports no uterine contractions, no bleeding and no loss of fluid per vagina.  Vitals:  Blood pressure (!) 144/93, pulse 71, temperature 98.3 F (36.8 C), temperature source Oral, resp. rate 16, height 4\' 11"  (1.499 m), weight 89.8 kg, last menstrual period 09/29/2021, SpO2 97 %.  Physical Examination: Lungs clear Heart RRR Abd soft + BS gravid Ext non tender   Fetal Monitoring:  Baseline: 130-150's bpm, Variability: Fair (1-6 bpm), Accelerations: Non-reactive but appropriate for gestational age, and Decelerations: Variable: mild  Labs:  Results for orders placed or performed during the hospital encounter of 05/05/22 (from the past 24 hour(s))  Glucose, capillary   Collection Time: 05/16/22 11:53 AM  Result Value Ref Range   Glucose-Capillary 100 (H) 70 - 99 mg/dL  Glucose, capillary   Collection Time: 05/16/22  4:32 PM  Result Value Ref Range   Glucose-Capillary 106 (H) 70 - 99 mg/dL  Glucose, capillary   Collection Time: 05/16/22  9:15 PM  Result Value Ref Range   Glucose-Capillary 111 (H) 70 - 99 mg/dL  Glucose, capillary   Collection Time: 05/17/22  5:38 AM  Result Value Ref Range   Glucose-Capillary 77 70 - 99 mg/dL    Imaging Studies:    NA   Medications:  Scheduled  aspirin EC  162 mg Oral Daily   docusate sodium  100 mg Oral Daily   escitalopram  10 mg Oral QHS   insulin aspart  0-14 Units Subcutaneous TID PC   labetalol  400 mg Oral TID   NIFEdipine  60 mg Oral BID   polyethylene glycol  17 g Oral Daily   prenatal multivitamin  1 tablet Oral Q1200   sodium chloride  flush  3 mL Intravenous Q12H   I have reviewed the patient's current medications.  ASSESSMENT: IUP 32 6/7 weeks CHTN with SIPEC, s/p Mag and BMZ IUGR IAEDF GDM  PLAN: Stable. Fetal well being reassuring. Continue with twice weekly antenatal testing. Delivery at 34 week or for maternal/fetal indciations Continue routine antenatal care.   8/7 05/17/2022,10:02 AM

## 2022-05-18 ENCOUNTER — Inpatient Hospital Stay (HOSPITAL_COMMUNITY): Payer: Managed Care, Other (non HMO)

## 2022-05-18 ENCOUNTER — Inpatient Hospital Stay (HOSPITAL_COMMUNITY): Payer: Managed Care, Other (non HMO) | Admitting: Anesthesiology

## 2022-05-18 ENCOUNTER — Encounter (HOSPITAL_COMMUNITY)
Admission: AD | Disposition: A | Discharge status: Home / Self Care | Payer: Self-pay | Source: Home / Self Care | Attending: Obstetrics and Gynecology

## 2022-05-18 ENCOUNTER — Encounter (HOSPITAL_COMMUNITY): Payer: Self-pay | Admitting: Obstetrics and Gynecology

## 2022-05-18 DIAGNOSIS — O10013 Pre-existing essential hypertension complicating pregnancy, third trimester: Secondary | ICD-10-CM

## 2022-05-18 DIAGNOSIS — O24424 Gestational diabetes mellitus in childbirth, insulin controlled: Secondary | ICD-10-CM

## 2022-05-18 DIAGNOSIS — O24313 Unspecified pre-existing diabetes mellitus in pregnancy, third trimester: Secondary | ICD-10-CM | POA: Diagnosis not present

## 2022-05-18 DIAGNOSIS — Z98891 History of uterine scar from previous surgery: Secondary | ICD-10-CM

## 2022-05-18 DIAGNOSIS — O99213 Obesity complicating pregnancy, third trimester: Secondary | ICD-10-CM | POA: Diagnosis not present

## 2022-05-18 DIAGNOSIS — Z3A33 33 weeks gestation of pregnancy: Secondary | ICD-10-CM

## 2022-05-18 DIAGNOSIS — O365930 Maternal care for other known or suspected poor fetal growth, third trimester, not applicable or unspecified: Secondary | ICD-10-CM

## 2022-05-18 DIAGNOSIS — O1414 Severe pre-eclampsia complicating childbirth: Secondary | ICD-10-CM | POA: Diagnosis not present

## 2022-05-18 DIAGNOSIS — O9902 Anemia complicating childbirth: Secondary | ICD-10-CM

## 2022-05-18 DIAGNOSIS — O114 Pre-existing hypertension with pre-eclampsia, complicating childbirth: Secondary | ICD-10-CM

## 2022-05-18 DIAGNOSIS — O1002 Pre-existing essential hypertension complicating childbirth: Secondary | ICD-10-CM

## 2022-05-18 DIAGNOSIS — E669 Obesity, unspecified: Secondary | ICD-10-CM

## 2022-05-18 LAB — GLUCOSE, CAPILLARY
Glucose-Capillary: 93 mg/dL (ref 70–99)
Glucose-Capillary: 104 mg/dL — ABNORMAL HIGH (ref 70–99)
Glucose-Capillary: 87 mg/dL (ref 70–99)

## 2022-05-18 LAB — CBC
HCT: 35.3 % — ABNORMAL LOW (ref 36.0–46.0)
Hemoglobin: 11.7 g/dL — ABNORMAL LOW (ref 12.0–15.0)
MCH: 30.2 pg (ref 26.0–34.0)
MCHC: 33.1 g/dL (ref 30.0–36.0)
MCV: 91 fL (ref 80.0–100.0)
Platelets: 260 10*3/uL (ref 150–400)
RBC: 3.88 MIL/uL (ref 3.87–5.11)
RDW: 13.7 % (ref 11.5–15.5)
WBC: 14 10*3/uL — ABNORMAL HIGH (ref 4.0–10.5)
nRBC: 0 % (ref 0.0–0.2)

## 2022-05-18 LAB — COMPREHENSIVE METABOLIC PANEL
ALT: 17 U/L (ref 0–44)
AST: 22 U/L (ref 15–41)
Albumin: 2.4 g/dL — ABNORMAL LOW (ref 3.5–5.0)
Alkaline Phosphatase: 89 U/L (ref 38–126)
Anion gap: 9 (ref 5–15)
BUN: 11 mg/dL (ref 6–20)
CO2: 22 mmol/L (ref 22–32)
Calcium: 8.6 mg/dL — ABNORMAL LOW (ref 8.9–10.3)
Chloride: 104 mmol/L (ref 98–111)
Creatinine, Ser: 0.71 mg/dL (ref 0.44–1.00)
GFR, Estimated: >60 mL/min (ref >60)
Glucose, Bld: 88 mg/dL (ref 70–99)
Potassium: 4.1 mmol/L (ref 3.5–5.1)
Sodium: 135 mmol/L (ref 135–145)
Total Bilirubin: 0.2 mg/dL — ABNORMAL LOW (ref 0.3–1.2)
Total Protein: 6 g/dL — ABNORMAL LOW (ref 6.5–8.1)

## 2022-05-18 SURGERY — Surgical Case
Anesthesia: Spinal

## 2022-05-18 MED ORDER — MAGNESIUM SULFATE 40 GM/1000ML IV SOLN
2.0000 g/h | INTRAVENOUS | Status: DC
  Filled 2022-05-18: qty 1000

## 2022-05-18 MED ORDER — BUPIVACAINE IN DEXTROSE 0.75-8.25 % IT SOLN
INTRATHECAL | Status: DC | PRN
  Administered 2022-05-18: 1.6 mL via INTRATHECAL

## 2022-05-18 MED ORDER — DEXAMETHASONE SODIUM PHOSPHATE 10 MG/ML IJ SOLN
INTRAMUSCULAR | Status: DC | PRN
  Administered 2022-05-18: 5 mg via INTRAVENOUS

## 2022-05-18 MED ORDER — PRENATAL MULTIVITAMIN CH
1.0000 | ORAL_TABLET | Freq: Every day | ORAL | Status: DC
  Administered 2022-05-19 – 2022-05-21 (×3): 1 via ORAL
  Filled 2022-05-18 (×3): qty 1

## 2022-05-18 MED ORDER — SENNOSIDES-DOCUSATE SODIUM 8.6-50 MG PO TABS
2.0000 | ORAL_TABLET | ORAL | Status: DC
  Administered 2022-05-19 – 2022-05-21 (×3): 2 via ORAL
  Filled 2022-05-18 (×3): qty 2

## 2022-05-18 MED ORDER — HYDROCODONE-ACETAMINOPHEN 5-325 MG PO TABS: 1.0000 | ORAL_TABLET | ORAL | Status: DC | PRN

## 2022-05-18 MED ORDER — KETOROLAC TROMETHAMINE 30 MG/ML IJ SOLN
30.0000 mg | Freq: Four times a day (QID) | INTRAMUSCULAR | Status: AC
  Administered 2022-05-18 – 2022-05-19 (×3): 30 mg via INTRAVENOUS
  Filled 2022-05-18 (×3): qty 1

## 2022-05-18 MED ORDER — OXYTOCIN-SODIUM CHLORIDE 30-0.9 UT/500ML-% IV SOLN
INTRAVENOUS | Status: DC | PRN
  Administered 2022-05-18: 300 mL via INTRAVENOUS

## 2022-05-18 MED ORDER — ENOXAPARIN SODIUM 40 MG/0.4ML IJ SOSY
40.0000 mg | PREFILLED_SYRINGE | INTRAMUSCULAR | Status: DC
  Administered 2022-05-19 – 2022-05-21 (×3): 40 mg via SUBCUTANEOUS
  Filled 2022-05-18 (×3): qty 0.4

## 2022-05-18 MED ORDER — MORPHINE SULFATE (PF) 0.5 MG/ML IJ SOLN
INTRAMUSCULAR | Status: DC | PRN
  Administered 2022-05-18: 150 ug via INTRATHECAL

## 2022-05-18 MED ORDER — DIPHENHYDRAMINE HCL 25 MG PO CAPS: 25.0000 mg | ORAL_CAPSULE | Freq: Four times a day (QID) | ORAL | Status: DC | PRN

## 2022-05-18 MED ORDER — LACTATED RINGERS IV SOLN: INTRAVENOUS | Status: DC

## 2022-05-18 MED ORDER — SOD CITRATE-CITRIC ACID 500-334 MG/5ML PO SOLN
ORAL | Status: AC
  Administered 2022-05-18: 30 mL
  Filled 2022-05-18: qty 30

## 2022-05-18 MED ORDER — CEFAZOLIN SODIUM-DEXTROSE 2-4 GM/100ML-% IV SOLN
2.0000 g | INTRAVENOUS | Status: AC
  Administered 2022-05-18: 2 g via INTRAVENOUS

## 2022-05-18 MED ORDER — DIPHENHYDRAMINE HCL 50 MG/ML IJ SOLN: 12.5000 mg | INTRAMUSCULAR | Status: DC | PRN

## 2022-05-18 MED ORDER — OXYTOCIN-SODIUM CHLORIDE 30-0.9 UT/500ML-% IV SOLN: 2.5000 [IU]/h | INTRAVENOUS | Status: AC

## 2022-05-18 MED ORDER — WITCH HAZEL-GLYCERIN EX PADS: 1.0000 | MEDICATED_PAD | CUTANEOUS | Status: DC | PRN

## 2022-05-18 MED ORDER — MAGNESIUM SULFATE 40 GM/1000ML IV SOLN
2.0000 g/h | INTRAVENOUS | Status: AC
  Administered 2022-05-19: 2 g/h via INTRAVENOUS
  Filled 2022-05-18: qty 1000

## 2022-05-18 MED ORDER — SODIUM CHLORIDE 0.9 % IR SOLN
Status: DC | PRN
  Administered 2022-05-18: 1

## 2022-05-18 MED ORDER — HYDRALAZINE HCL 20 MG/ML IJ SOLN: 10.0000 mg | INTRAMUSCULAR | Status: DC | PRN

## 2022-05-18 MED ORDER — MAGNESIUM SULFATE BOLUS VIA INFUSION
6.0000 g | Freq: Once | INTRAVENOUS | Status: AC
Start: 2022-05-18 — End: 2022-05-18
  Administered 2022-05-18: 6 g via INTRAVENOUS
  Filled 2022-05-18: qty 1000

## 2022-05-18 MED ORDER — FENTANYL CITRATE (PF) 100 MCG/2ML IJ SOLN
INTRAMUSCULAR | Status: AC
  Filled 2022-05-18: qty 2

## 2022-05-18 MED ORDER — ONDANSETRON HCL 4 MG/2ML IJ SOLN
INTRAMUSCULAR | Status: AC
  Filled 2022-05-18: qty 2

## 2022-05-18 MED ORDER — NALOXONE HCL 4 MG/10ML IJ SOLN: 1.0000 ug/kg/h | INTRAVENOUS | Status: DC | PRN

## 2022-05-18 MED ORDER — MEPERIDINE HCL 25 MG/ML IJ SOLN: 6.2500 mg | INTRAMUSCULAR | Status: DC | PRN

## 2022-05-18 MED ORDER — PHENYLEPHRINE 80 MCG/ML (10ML) SYRINGE FOR IV PUSH (FOR BLOOD PRESSURE SUPPORT)
PREFILLED_SYRINGE | INTRAVENOUS | Status: DC | PRN
  Administered 2022-05-18: 160 ug via INTRAVENOUS
  Administered 2022-05-18: 80 ug via INTRAVENOUS

## 2022-05-18 MED ORDER — SOD CITRATE-CITRIC ACID 500-334 MG/5ML PO SOLN: 30.0000 mL | ORAL | Status: DC

## 2022-05-18 MED ORDER — FENTANYL CITRATE (PF) 100 MCG/2ML IJ SOLN
INTRAMUSCULAR | Status: DC | PRN
  Administered 2022-05-18: 15 ug via INTRATHECAL

## 2022-05-18 MED ORDER — MENTHOL 3 MG MT LOZG: 1.0000 | LOZENGE | OROMUCOSAL | Status: DC | PRN

## 2022-05-18 MED ORDER — OXYTOCIN-SODIUM CHLORIDE 30-0.9 UT/500ML-% IV SOLN
INTRAVENOUS | Status: AC
  Filled 2022-05-18: qty 500

## 2022-05-18 MED ORDER — PHENYLEPHRINE HCL-NACL 20-0.9 MG/250ML-% IV SOLN
INTRAVENOUS | Status: DC | PRN
  Administered 2022-05-18: 20 ug/min via INTRAVENOUS

## 2022-05-18 MED ORDER — SIMETHICONE 80 MG PO CHEW
80.0000 mg | CHEWABLE_TABLET | Freq: Three times a day (TID) | ORAL | Status: DC
  Administered 2022-05-19 – 2022-05-21 (×7): 80 mg via ORAL
  Filled 2022-05-18 (×7): qty 1

## 2022-05-18 MED ORDER — KETOROLAC TROMETHAMINE 30 MG/ML IJ SOLN
INTRAMUSCULAR | Status: AC
  Filled 2022-05-18: qty 1

## 2022-05-18 MED ORDER — LABETALOL HCL 5 MG/ML IV SOLN: 40.0000 mg | INTRAVENOUS | Status: DC | PRN

## 2022-05-18 MED ORDER — NIFEDIPINE ER OSMOTIC RELEASE 30 MG PO TB24
30.0000 mg | ORAL_TABLET | Freq: Every day | ORAL | Status: DC
  Administered 2022-05-19 – 2022-05-20 (×2): 30 mg via ORAL
  Filled 2022-05-18 (×2): qty 1

## 2022-05-18 MED ORDER — KETOROLAC TROMETHAMINE 30 MG/ML IJ SOLN: 30.0000 mg | Freq: Four times a day (QID) | INTRAMUSCULAR | Status: DC | PRN

## 2022-05-18 MED ORDER — STERILE WATER FOR IRRIGATION IR SOLN
Status: DC | PRN
  Administered 2022-05-18: 1000 mL

## 2022-05-18 MED ORDER — ONDANSETRON HCL 4 MG/2ML IJ SOLN
INTRAMUSCULAR | Status: DC | PRN
  Administered 2022-05-18: 4 mg via INTRAVENOUS

## 2022-05-18 MED ORDER — PHENYLEPHRINE HCL-NACL 20-0.9 MG/250ML-% IV SOLN
INTRAVENOUS | Status: AC
  Filled 2022-05-18: qty 250

## 2022-05-18 MED ORDER — SIMETHICONE 80 MG PO CHEW: 80.0000 mg | CHEWABLE_TABLET | ORAL | Status: DC | PRN

## 2022-05-18 MED ORDER — DIBUCAINE (PERIANAL) 1 % EX OINT: 1.0000 | TOPICAL_OINTMENT | CUTANEOUS | Status: DC | PRN

## 2022-05-18 MED ORDER — COCONUT OIL OIL: 1.0000 | TOPICAL_OIL | Status: DC | PRN

## 2022-05-18 MED ORDER — LABETALOL HCL 5 MG/ML IV SOLN: 80.0000 mg | INTRAVENOUS | Status: DC | PRN

## 2022-05-18 MED ORDER — LABETALOL HCL 5 MG/ML IV SOLN: 20.0000 mg | INTRAVENOUS | Status: DC | PRN

## 2022-05-18 MED ORDER — NALOXONE HCL 0.4 MG/ML IJ SOLN: 0.4000 mg | INTRAMUSCULAR | Status: DC | PRN

## 2022-05-18 MED ORDER — MORPHINE SULFATE (PF) 0.5 MG/ML IJ SOLN
INTRAMUSCULAR | Status: AC
  Filled 2022-05-18: qty 10

## 2022-05-18 MED ORDER — ONDANSETRON HCL 4 MG/2ML IJ SOLN: 4.0000 mg | Freq: Three times a day (TID) | INTRAMUSCULAR | Status: DC | PRN

## 2022-05-18 MED ORDER — LACTATED RINGERS IV SOLN: INTRAVENOUS | Status: DC | PRN

## 2022-05-18 MED ORDER — KETOROLAC TROMETHAMINE 30 MG/ML IJ SOLN
30.0000 mg | Freq: Four times a day (QID) | INTRAMUSCULAR | Status: DC | PRN
  Administered 2022-05-18: 30 mg via INTRAMUSCULAR

## 2022-05-18 MED ORDER — ACETAMINOPHEN 10 MG/ML IV SOLN
INTRAVENOUS | Status: DC | PRN
  Administered 2022-05-18: 1000 mg via INTRAVENOUS

## 2022-05-18 MED ORDER — SODIUM CHLORIDE 0.9% FLUSH
3.0000 mL | INTRAVENOUS | Status: DC | PRN
  Administered 2022-05-19: 3 mL via INTRAVENOUS

## 2022-05-18 MED ORDER — DIPHENHYDRAMINE HCL 25 MG PO CAPS: 25.0000 mg | ORAL_CAPSULE | ORAL | Status: DC | PRN

## 2022-05-18 MED ORDER — CEFAZOLIN SODIUM-DEXTROSE 2-4 GM/100ML-% IV SOLN
INTRAVENOUS | Status: AC
  Filled 2022-05-18: qty 100

## 2022-05-18 MED ORDER — FENTANYL CITRATE (PF) 100 MCG/2ML IJ SOLN: 25.0000 ug | INTRAMUSCULAR | Status: DC | PRN

## 2022-05-18 MED ORDER — ACETAMINOPHEN 10 MG/ML IV SOLN
INTRAVENOUS | Status: AC
  Filled 2022-05-18: qty 100

## 2022-05-18 MED ORDER — BUPIVACAINE IN DEXTROSE 0.75-8.25 % IT SOLN
INTRATHECAL | Status: AC
  Filled 2022-05-18: qty 2

## 2022-05-18 MED ORDER — IBUPROFEN 600 MG PO TABS
600.0000 mg | ORAL_TABLET | Freq: Four times a day (QID) | ORAL | Status: DC
Start: 2022-05-19 — End: 2022-05-21
  Administered 2022-05-19 – 2022-05-21 (×8): 600 mg via ORAL
  Filled 2022-05-18 (×8): qty 1

## 2022-05-18 MED ORDER — LACTATED RINGERS IV SOLN
INTRAVENOUS | Status: AC
  Administered 2022-05-18: 75 mL/h via INTRAVENOUS

## 2022-05-18 SURGICAL SUPPLY — 41 items
APL SKNCLS STERI-STRIP NONHPOA (GAUZE/BANDAGES/DRESSINGS) ×1
BENZOIN TINCTURE PRP APPL 2/3 (GAUZE/BANDAGES/DRESSINGS) ×1 IMPLANT
CHLORAPREP W/TINT 26ML (MISCELLANEOUS) ×6 IMPLANT
CLAMP CORD UMBIL (MISCELLANEOUS) ×3 IMPLANT
CLOSURE STERI STRIP 1/2 X4 (GAUZE/BANDAGES/DRESSINGS) ×1 IMPLANT
CLOTH BEACON ORANGE TIMEOUT ST (SAFETY) ×3 IMPLANT
DRSG OPSITE POSTOP 4X10 (GAUZE/BANDAGES/DRESSINGS) ×3 IMPLANT
ELECT REM PT RETURN 9FT ADLT (ELECTROSURGICAL) ×2
ELECTRODE REM PT RTRN 9FT ADLT (ELECTROSURGICAL) ×2 IMPLANT
EXTRACTOR VACUUM BELL STYLE (SUCTIONS) IMPLANT
GAUZE SPONGE 4X4 12PLY STRL (GAUZE/BANDAGES/DRESSINGS) ×1 IMPLANT
GLOVE BIOGEL PI IND STRL 7.0 (GLOVE) ×4 IMPLANT
GLOVE BIOGEL PI INDICATOR 7.0 (GLOVE) ×2
GLOVE ECLIPSE 7.0 STRL STRAW (GLOVE) ×3 IMPLANT
GOWN STRL REUS W/TWL LRG LVL3 (GOWN DISPOSABLE) ×6 IMPLANT
KIT ABG SYR 3ML LUER SLIP (SYRINGE) ×3 IMPLANT
MAT PREVALON FULL STRYKER (MISCELLANEOUS) ×1 IMPLANT
NDL HYPO 25X5/8 SAFETYGLIDE (NEEDLE) ×2 IMPLANT
NEEDLE HYPO 25X5/8 SAFETYGLIDE (NEEDLE) ×2 IMPLANT
NS IRRIG 1000ML POUR BTL (IV SOLUTION) ×3 IMPLANT
PACK C SECTION WH (CUSTOM PROCEDURE TRAY) ×3 IMPLANT
PAD ABD 8X10 STRL (GAUZE/BANDAGES/DRESSINGS) ×1 IMPLANT
PAD OB MATERNITY 4.3X12.25 (PERSONAL CARE ITEMS) ×3 IMPLANT
RTRCTR C-SECT PINK 25CM LRG (MISCELLANEOUS) ×3 IMPLANT
SET BERKELEY SUCTION TUBING (SUCTIONS) ×2 IMPLANT
SUT MNCRL 0 VIOLET CTX 36 (SUTURE) ×4 IMPLANT
SUT MONOCRYL 0 CTX 36 (SUTURE) ×4
SUT PLAIN 0 NONE (SUTURE) IMPLANT
SUT PLAIN 2 0 (SUTURE) ×2
SUT PLAIN 2 0 XLH (SUTURE) IMPLANT
SUT PLAIN ABS 2-0 CT1 27XMFL (SUTURE) IMPLANT
SUT VIC AB 0 CTX 36 (SUTURE) ×2
SUT VIC AB 0 CTX36XBRD ANBCTRL (SUTURE) ×2 IMPLANT
SUT VIC AB 2-0 CT1 27 (SUTURE)
SUT VIC AB 2-0 CT1 TAPERPNT 27 (SUTURE) IMPLANT
SUT VIC AB 4-0 KS 27 (SUTURE) ×3 IMPLANT
TAPE PAPER 3X10 WHT MICROPORE (GAUZE/BANDAGES/DRESSINGS) ×1 IMPLANT
TOWEL OR 17X24 6PK STRL BLUE (TOWEL DISPOSABLE) ×3 IMPLANT
TRAY FOLEY W/BAG SLVR 14FR LF (SET/KITS/TRAYS/PACK) IMPLANT
WATER STERILE IRR 1000ML POUR (IV SOLUTION) ×3 IMPLANT
YANKAUER SUCT BULB TIP NO VENT (SUCTIONS) ×2 IMPLANT

## 2022-05-18 NOTE — Progress Notes (Signed)
OB Note  U/s tech said BPP 4/8. I d/w patient that I will talk to mfm but they will most likely recommend delivery. I told her I recommend a primary c/s given baby's tracing over the weekend showing episodic decels and I don't believe the baby would tolerate labor, which she and her husband are in agreement with Labs normal yesterday but will get repeat ones since she is severe pre-eclamptic. I also d/w her starting Mg and continuing that for 24 hours post op  She has been npo today. Start continous monitoring  Cornelia Copa MD Attending [facility] [employer] (Faculty Practice) 05/18/2022 Time: [id number]  OB Update Note Dr. Judeth Cornfield called Dr. Crissie Reese and recommended delivery  Crosby Bing, Montez Hageman MD Attending [facility] for [facility] (Faculty Practice) 05/18/2022 Time: 0930

## 2022-05-18 NOTE — Anesthesia Postprocedure Evaluation (Signed)
Anesthesia Post Note  Patient: Kim Miles  Procedure(s) Performed: CESAREAN SECTION     Patient location during evaluation: PACU Anesthesia Type: Spinal Level of consciousness: oriented and awake and alert Pain management: pain level controlled Vital Signs Assessment: post-procedure vital signs reviewed and stable Respiratory status: spontaneous breathing, respiratory function stable and nonlabored ventilation Cardiovascular status: blood pressure returned to baseline and stable Postop Assessment: no headache, no backache, no apparent nausea or vomiting, spinal receding and patient able to bend at knees Anesthetic complications: no Comments: Intrathecal catheter left in place until tomorrow. Will D/C then. Discussed "wet tap" with Touhy needle and intrathecal catheter and higher likliehood of PDPHA.   No notable events documented.  Last Vitals:  Vitals:   05/18/22 1513 05/18/22 1600  BP: 120/86 125/81  Pulse: 65 67  Resp: 16   Temp: 36.7 C   SpO2: 98%     Last Pain:  Vitals:   05/18/22 1515  TempSrc:   PainSc: 0-No pain   Pain Goal: Patients Stated Pain Goal: 3 (05/18/22 1430)              Epidural/Spinal Function Cutaneous sensation: Normal sensation (05/18/22 1515), Patient able to flex knees: Yes (05/18/22 1515), Patient able to lift hips off bed: Yes (05/18/22 1515), Back pain beyond tenderness at insertion site: No (05/18/22 1515), Progressively worsening motor and/or sensory loss: No (05/18/22 1515), Bowel and/or bladder incontinence post epidural: No (05/18/22 1515)  Paras Kreider A.

## 2022-05-18 NOTE — Plan of Care (Signed)
  Problem: Education: Goal: Knowledge of disease or condition will improve Outcome: Completed/Met Goal: Knowledge of the prescribed therapeutic regimen will improve Outcome: Completed/Met Goal: Individualized Educational Video(s) Outcome: Completed/Met   Problem: Clinical Measurements: Goal: Complications related to the disease process, condition or treatment will be avoided or minimized Outcome: Completed/Met   Problem: Education: Goal: Knowledge of General Education information will improve Description: Including pain rating scale, medication(s)/side effects and non-pharmacologic comfort measures Outcome: Completed/Met   Problem: Coping: Goal: Level of anxiety will decrease Outcome: Completed/Met   Problem: Education: Goal: Knowledge of disease or condition will improve Outcome: Completed/Met   Problem: Education: Goal: Knowledge of disease or condition will improve Outcome: Completed/Met   Problem: Clinical Measurements: Goal: Complications related to the disease process, condition or treatment will be avoided or minimized Outcome: Completed/Met   Problem: Coping: Goal: Ability to identify and utilize available resources and services will improve Outcome: Completed/Met   Problem: Coping: Goal: Ability to identify and utilize available resources and services will improve Outcome: Completed/Met

## 2022-05-18 NOTE — Discharge Summary (Shared)
Postpartum Discharge Summary  Patient Name: Kim Miles DOB: April 25, 1991 MRN: [id number]  Date of admission: 05/05/2022 Delivery date:05/18/2022  Delivering provider: Clarnce Flock  Date of discharge: 05/21/2022  Admitting diagnosis: Pregnancy at 31/1. Severe pre-eclampsia superimposed on chronic HTN. New FGR Intrauterine pregnancy: [redacted]w[redacted]d    Additional problems: GDMA1.     Discharge diagnosis: Preterm Pregnancy Delivered, Preeclampsia (severe), CHTN with superimposed preeclampsia, and GDM A1                                               Hospital course: Patient direct admitted from MFM clinic for new superimposed on CHTN severe pre-eclampsia diagnosis, based on BPs; she also had new FGR.  Her clinical course notable for admission 13 days prior for chronic hypertension with superimposed severe pre-eclampsia as well as severe IUGR <1% and intermittent AEDF. On day of delivery patient had BPP that was 4/8 and cord dopplers now showed persistent AEDF, at which point MFM recommended delivery which was done via c-section Membrane Rupture Time/Date: 12:47 PM ,05/18/2022   Delivery Method:C-Section, Low Transverse  Details of operation can be found in separate operative note.   *Postpartum: Patient had an uncomplicated postpartum course.  She is ambulating, tolerating a regular diet, passing flatus, and urinating well. *Severe pre-eclampsia superimposed on CHTN: patient received 24 hours of Mg and was transitioned off her multidose labetalol and procardia to just procardia xl 30 qday which was increased to bid. She was also started on po bid lasix x 5 days immediately after delivery. *GDM: she was started on metformin 500 bidad post operatively. Patient instructed to continue to check home CBGs a few times a day for now.   Newborn Data: Birth date:05/18/2022  Birth time:12:48 PM  Gender:Female  Living status:Living  Apgars:9 ,9  Weight:1380 g     Magnesium Sulfate received: Yes: Seizure  prophylaxis BMZ received: Yes Rhophylac:N/A MMR:N/A T-DaP:Given prenatally Flu: N/A  Physical exam  Vitals:   05/20/22 2041 05/20/22 2323 05/21/22 0442 05/21/22 0810  BP: 140/82 (!) 142/90 (!) 144/87 (!) 144/98  Pulse: 79 75 74 78  Resp: 17 18 16 16   Temp: 99.2 F (37.3 C) 98 F (36.7 C) 97.8 F (36.6 C) 97.6 F (36.4 C)  TempSrc: Oral Oral Oral Oral  SpO2: 99% 99% 99% 99%  Weight:      Height:       General: alert Lochia: appropriate Uterine Fundus: nttp Incision: c/d/I honeycomb dressing DVT Evaluation: No evidence of DVT seen on physical exam. Labs: Lab Results  Component Value Date   WBC 20.2 (H) 05/19/2022   HGB 10.7 (L) 05/19/2022   HCT 31.1 (L) 05/19/2022   MCV 90.4 05/19/2022   PLT 241 05/19/2022      Latest Ref Rng & Units 05/18/2022    9:24 AM  CMP  Glucose 70 - 99 mg/dL 88   BUN 6 - 20 mg/dL 11   Creatinine 0.44 - 1.00 mg/dL 0.71   Sodium 135 - 145 mmol/L 135   Potassium 3.5 - 5.1 mmol/L 4.1   Chloride 98 - 111 mmol/L 104   CO2 22 - 32 mmol/L 22   Calcium 8.9 - 10.3 mg/dL 8.6   Total Protein 6.5 - 8.1 g/dL 6.0   Total Bilirubin 0.3 - 1.2 mg/dL 0.2   Alkaline Phos 38 - 126 U/L 89   AST  15 - 41 U/L 22   ALT 0 - 44 U/L 17    Edinburgh Score:    05/19/2022    7:38 PM  Edinburgh Postnatal Depression Scale Screening Tool  I have been able to laugh and see the funny side of things. 0  I have looked forward with enjoyment to things. 0  I have blamed myself unnecessarily when things went wrong. 0  I have been anxious or worried for no good reason. 0  I have felt scared or panicky for no good reason. 0  Things have been getting on top of me. 0  I have been so unhappy that I have had difficulty sleeping. 0  I have felt sad or miserable. 0  I have been so unhappy that I have been crying. 0  The thought of harming myself has occurred to me. 0  Edinburgh Postnatal Depression Scale Total 0     After visit meds:  Allergies as of 05/21/2022   No  Known Allergies      Medication List     STOP taking these medications    aspirin EC 81 MG tablet       TAKE these medications    Accu-Chek Guide test strip Generic drug: glucose blood USE UP TO FOUR TIMES DAILY AS DIRECTED.   Accu-Chek Softclix Lancets lancets SMARTSIG:Topical 1-4 Times Daily   blood glucose meter kit and supplies Kit Dispense based on patient and insurance preference. Use up to four times daily as directed.   calcium carbonate 500 MG chewable tablet Commonly known as: TUMS - dosed in mg elemental calcium Chew 500 mg by mouth daily as needed for indigestion or heartburn.   escitalopram 10 MG tablet Commonly known as: LEXAPRO Take 1 tablet (10 mg total) by mouth at bedtime.   furosemide 20 MG tablet Commonly known as: LASIX Take 1 tablet (20 mg total) by mouth 2 (two) times daily for 2 days.   HYDROcodone-acetaminophen 5-325 MG tablet Commonly known as: NORCO/VICODIN Take 1-2 tablets by mouth every 4 (four) hours as needed for moderate pain.   ibuprofen 600 MG tablet Commonly known as: ADVIL Take 1 tablet (600 mg total) by mouth every 6 (six) hours as needed.   metFORMIN 500 MG tablet Commonly known as: GLUCOPHAGE Take 1 tablet (500 mg total) by mouth 2 (two) times daily with a meal.   NIFEdipine 30 MG 24 hr tablet Commonly known as: ADALAT CC Take 1 tablet (30 mg total) by mouth 2 (two) times daily. What changed:  medication strength how much to take when to take this   polyethylene glycol powder 17 GM/SCOOP powder Commonly known as: GLYCOLAX/MIRALAX Take 17 g by mouth daily.   PRENATAL GUMMIES PO Take 2 tablets by mouth daily.   senna-docusate 8.6-50 MG tablet Commonly known as: Senokot-S Take 2 tablets by mouth at bedtime as needed for mild constipation.   simethicone 80 MG chewable tablet Commonly known as: MYLICON Chew 1 tablet (80 mg total) by mouth 4 (four) times daily as needed for flatulence.         Discharge  home in stable condition Infant Feeding: Breast Infant Disposition:NICU Discharge instruction: per After Visit Summary and Postpartum booklet. Activity: Advance as tolerated. Pelvic rest for 6 weeks.  Diet: routine diet Future Appointments: Future Appointments  Date Time Provider [place]  05/25/2022 10:45 AM [facility] NURSE [facility]  06/30/2022  9:55 AM Anyanwu, Sallyanne Havers, MD [facility]  07/02/2022  9:30 AM [facility] LAB [facility]  [facility]   Follow up Visit:  [place] [facility] [place] [facility]. Go in 4 day(s).   Specialty: Obstetrics and Gynecology Why: Blood pressure, incision and blood sugar check Contact information: [place] [phone]               Please schedule this patient for a In person postpartum visit in 4 weeks with the following provider: MD. Additional Postpartum F/U:2 hour GTT, Incision check 1 week, and BP check 1 week  High risk pregnancy complicated by: GDM and HTN Delivery mode:  C-Section, Low Transverse  Anticipated Birth Control:  Unsure   05/21/2022 Aletha Halim, MD

## 2022-05-18 NOTE — Anesthesia Preprocedure Evaluation (Addendum)
Anesthesia Evaluation  Patient identified by MRN, date of birth, ID band Patient awake    Reviewed: Allergy & Precautions, NPO status , Patient's Chart, lab work & pertinent test results, reviewed documented beta blocker date and time   Airway Mallampati: III  TM Distance: >3 FB Neck ROM: Full    Dental no notable dental hx. (+) Teeth Intact, Dental Advisory Given   Pulmonary neg pulmonary ROS,    Pulmonary exam normal breath sounds clear to auscultation       Cardiovascular hypertension, Pt. on medications and Pt. on home beta blockers Normal cardiovascular exam Rhythm:Regular Rate:Normal     Neuro/Psych  Headaches, PSYCHIATRIC DISORDERS Depression    GI/Hepatic Neg liver ROS, GERD  Medicated,  Endo/Other  diabetes, Well Controlled, Gestational, Insulin DependentMorbid obesity  Renal/GU negative Renal ROS  negative genitourinary   Musculoskeletal negative musculoskeletal ROS (+)   Abdominal (+) + obese,   Peds  Hematology  (+) Blood dyscrasia, anemia ,   Anesthesia Other Findings   Reproductive/Obstetrics (+) Pregnancy 33 weeks Severe Pre Eclampsia superimposed on cHTN BPP 4/8 Recurrent Decels on [facility]                             Anesthesia Physical Anesthesia Plan  ASA: 3  Anesthesia Plan: Spinal   Post-op Pain Management: Regional block* and Minimal or no pain anticipated   Induction: Intravenous  PONV Risk Score and Plan: 4 or greater  Airway Management Planned: Natural Airway  Additional Equipment:   Intra-op Plan:   Post-operative Plan:   Informed Consent: I have reviewed the patients History and Physical, chart, labs and discussed the procedure including the risks, benefits and alternatives for the proposed anesthesia with the patient or authorized representative who has indicated his/her understanding and acceptance.     Dental advisory given  Plan Discussed  with: CRNA and Anesthesiologist  Anesthesia Plan Comments:         Anesthesia Quick Evaluation

## 2022-05-18 NOTE — Lactation Note (Signed)
This note was copied from a baby's chart.  NICU Lactation Consultation Note  Patient Name: Kim Miles GYBWL'S Date: 05/18/2022 Age:31 years  Subjective Reason for consult: Initial assessment; 1st time breastfeeding; Primapara; NICU baby; Maternal endocrine disorder; Infant < 6lbs; Preterm <34wks  Visited with mom of 4 hours old pre-term NICU female, she's a P1 and plans on exclusively breastfeeding and taking baby to breast when he's ready. Ms. Troxler has already been set up with a DEBP upon my arrival, she was finishing up with her first pumping sessions, praised her for her efforts. Reviewed pumping schedule, lactogenesis II and anticipatory guidelines.  Objective Infant data: Mother's Current Feeding Choice: Breast Milk  Maternal data: G1P0101  C-Section, Low Transverse Significant Breast History:: (+) breast changes Current breast feeding challenges:: NICU admission Does the patient have breastfeeding experience prior to this delivery?: No Pumping frequency: q 3 hours Pumped volume: 0 mL Flange Size: 24 Risk factor for low milk supply:: primipara, infant separation, GDM Pump: Personal (DEBP from insurance)  Assessment Infant: In NICU  Maternal: Milk volume: Normal  Intervention/Plan Interventions: Breast feeding basics reviewed; DEBP; Education; [employer] brochure; Breast massage; Hand express Tools: Pump; Flanges Pump Education: Setup, frequency, and cleaning; Milk Storage  Plan of care: Encouraged mom to pump every 3 hours, at least 8 pumping sessions/24 hours Breast massage and hand expression were also encouraged prior pumping  No other support person at this time. All questions and concerns answered, family to contact [facility] services PRN.  Consult Status: NICU follow-up  NICU Follow-up type: New admission follow up; Maternal D/C visit; Verify onset of copious milk; Verify absence of engorgement   Kim Miles S Kim Miles 05/18/2022, 5:05 PM

## 2022-05-18 NOTE — Anesthesia Procedure Notes (Signed)
Spinal  Patient location during procedure: OR Start time: 05/18/2022 12:13 PM End time: 05/18/2022 12:21 PM Staffing Performed: anesthesiologist  Anesthesiologist: Mal Amabile, MD Performed by: Mal Amabile, MD Authorized by: Mal Amabile, MD   Preanesthetic Checklist Completed: patient identified, IV checked, site marked, risks and benefits discussed, surgical consent, monitors and equipment checked, pre-op evaluation and timeout performed Spinal Block Patient position: sitting Prep: DuraPrep and site prepped and draped Patient monitoring: cardiac monitor, continuous pulse ox, blood pressure and heart rate Approach: midline Location: L3-4 Injection technique: catheter Needle Needle type: Tuohy  Needle gauge: 24 G Needle length: 9 cm Needle insertion depth: 7 cm Catheter type: closed end flexible Catheter size: 19 g Assessment Sensory level: T4 Events: CSF return Additional Notes Epidural performed using LOR with air technique. CSF obtained with Touhy needle with free flow and no paresthesias. Epidural catheter threaded into the intrathecal space epidural needle was withdrawn. A sterile dressing was applied and the patient placed supine with LUD. The patient tolerated the procedure well and adequate sensory level was obtained. Catheter was capped and labeled "intrathecal catheter". Will leave intrathecal catheter in place until the am and D/C tomorrow.

## 2022-05-18 NOTE — Transfer of Care (Signed)
Immediate Anesthesia Transfer of Care Note  Patient: Kim Miles  Procedure(s) Performed: CESAREAN SECTION  Patient Location: PACU  Anesthesia Type:Spinal  Level of Consciousness: awake  Airway & Oxygen Therapy: Patient Spontanous Breathing  Post-op Assessment: Report given to RN and Post -op Vital signs reviewed and stable  Post vital signs: Reviewed and stable  Last Vitals:  Vitals Value Taken Time  BP 120/83 05/18/22 1356  Temp    Pulse 63 05/18/22 1358  Resp 13 05/18/22 1358  SpO2 96 % 05/18/22 1358  Vitals shown include unvalidated device data.  Last Pain:  Vitals:   05/18/22 1112  TempSrc: Oral  PainSc:       Patients Stated Pain Goal: 3 (05/08/22 0840)  Complications: No notable events documented.

## 2022-05-18 NOTE — Plan of Care (Signed)
  Problem: Education: Goal: Knowledge of disease or condition will improve Outcome: Completed/Met Goal: Knowledge of the prescribed therapeutic regimen will improve Outcome: Completed/Met Goal: Individualized Educational Video(s) Outcome: Completed/Met   Problem: Clinical Measurements: Goal: Complications related to the disease process, condition or treatment will be avoided or minimized Outcome: Completed/Met   Problem: Education: Goal: Knowledge of General Education information will improve Description: Including pain rating scale, medication(s)/side effects and non-pharmacologic comfort measures Outcome: Completed/Met   Problem: Coping: Goal: Level of anxiety will decrease Outcome: Completed/Met   Problem: Education: Goal: Knowledge of disease or condition will improve Outcome: Completed/Met   Problem: Clinical Measurements: Goal: Complications related to the disease process, condition or treatment will be avoided or minimized Outcome: Completed/Met

## 2022-05-18 NOTE — Progress Notes (Signed)
   05/18/22 2300  Vital Signs  BP 126/85  BP Location Right Arm  Patient Position (if appropriate) Orthostatic Vitals  BP Method Automatic  Pulse Rate 68  Pulse Rate Source Monitor  Resp 18  Temp 97.8 F (36.6 C)  Temp Source Oral  Orthostatic Lying   BP- Lying 126/85  Pulse- Lying 73  Orthostatic Sitting  BP- Sitting 117/84  Pulse- Sitting 73  POSS Scale (Pasero Opioid Sedation Scale)  POSS *See Group Information* 1-Acceptable,Awake and alert  PCA/Epidural/Spinal Assessment  Respiratory Pattern Regular;Unlabored;Symmetrical  Patient/Family Education Done  Epidural Specific Assessment  Dressing/Site/Catheter Assessment Clean;Dry;Intact (cath capped)  Epidural Precautions Narcan available  Epidural Side Effect Assessment None  Color/Movement/Sensation (CMS) Normal  Oxygen Therapy  SpO2 95 %    Attempted to ambulate patient but patient c/o dizziness upon standing.

## 2022-05-18 NOTE — Op Note (Signed)
Operative Note   Patient: Kim Miles  Date of Procedure: 05/05/2022 - 05/18/2022  Procedure: Primary Low Transverse Cesarean   Indications: non-reassuring fetal status and severe preeclampsia  Pre-operative Diagnosis: Unscheduled primary  Cesarean Section preeclampsia and biophysical profile 4 out of 8.   Post-operative Diagnosis: Same  TOLAC Candidate: Yes   Surgeon: Surgeon(s) and Role:    * Moshe Wenger, Mary Sella, MD - Primary    * Nobie Putnam, Cyndi Lennert, MD - Fellow  Assistants: none  An experienced assistant was required given the standard of surgical care given the complexity of the case.  This assistant was needed for exposure, dissection, suctioning, retraction, instrument exchange, assisting with delivery with administration of fundal pressure, and for overall help during the procedure.   Anesthesia: spinal  Anesthesiologist: Mal Amabile, MD   Antibiotics: Cefazolin   Estimated Blood Loss: 209 ml   Total IV Fluids: 1100 ml  Urine Output:  400 cc OF clear urine  Specimens: placenta to pathology   Complications: no complications   Indications: Kim Miles is a 31 y.o. G1P0 with an IUP [redacted]w[redacted]d presenting for unscheduled, urgent cesarean secondary to the indications listed above. Clinical course notable for admission 13 days prior for chronic hypertension with superimposed severe pre-eclampsia as well as severe IUGR <1% and intermittent AEDF. On day of delivery patient had BPP that was 4/8 and cord dopplers now showed persistent AEDF, at which point MFM recommended delivery.   The risks of cesarean section discussed with the patient included but were not limited to: bleeding which may require transfusion or reoperation; infection which may require antibiotics; injury to bowel, bladder, ureters or other surrounding organs; injury to the fetus; need for additional procedures including hysterectomy in the event of a life-threatening hemorrhage; placental  abnormalities with subsequent pregnancies, incisional problems, thromboembolic phenomenon and other postoperative/anesthesia complications. The patient concurred with the proposed plan, giving informed written consent for the procedure. Patient NPO status waived given urgency of case. Anesthesia and OR aware. Preoperative prophylactic antibiotics and SCDs ordered on call to the OR.   Findings: Viable infant in cephalic presentation, no nuchal cord present. Apgars 9, 9. Weight 1380 g . Clear amniotic fluid. Normal placenta, three vessel cord. Normal uterus, Normal bilateral fallopian tubes, Normal bilateral ovaries.  Procedure Details: A Time Out was held and the above information confirmed. The patient received intravenous antibiotics and had sequential compression devices applied to her lower extremities preoperatively. The patient was taken back to the operative suite where spinal anesthesia was administered. After induction of anesthesia, the patient was draped and prepped in the usual sterile manner and placed in a dorsal supine position with a leftward tilt. A low transverse skin incision was made with scalpel and carried down through the subcutaneous tissue to the fascia. Fascial incision was made and extended transversely. The fascia was separated from the underlying rectus tissue superiorly and inferiorly. The rectus muscles were separated in the midline bluntly and the peritoneum was entered bluntly. An Alexis retractor was placed to aid in visualization of the uterus. A bladder flap was not developed. A low transverse uterine incision was made. The infant was successfully delivered from cephalic presentation, the umbilical cord was clamped after 1 minute. Cord ph was sent, and cord blood was not obtained for evaluation due to insufficient sample. The placenta was removed Intact and appeared normal. The uterine incision was closed with running locked sutures of 0-Monocryl. Overall, excellent hemostasis  was noted. The abdomen and the pelvis were  cleared of all clot and debris and the Jon Gills was removed. Hemostasis was confirmed on all surfaces.  The peritoneum was reapproximated using 2-0 vicryl . The fascia was then closed using 0 Vicryl in a running fashion. The subcutaneous layer was reapproximated with plain gut and the skin was closed with a 4-0 vicryl subcuticular stitch. The patient tolerated the procedure well. Sponge, lap, instrument and needle counts were correct x 2. She was taken to the recovery room in stable condition.  Disposition: PACU - hemodynamically stable.    Signed: Venora Maples, MD, MPH Center for [facility])

## 2022-05-19 LAB — CBC
HCT: 31.1 % — ABNORMAL LOW (ref 36.0–46.0)
Hemoglobin: 10.7 g/dL — ABNORMAL LOW (ref 12.0–15.0)
MCH: 31.1 pg (ref 26.0–34.0)
MCHC: 34.4 g/dL (ref 30.0–36.0)
MCV: 90.4 fL (ref 80.0–100.0)
Platelets: 241 10*3/uL (ref 150–400)
RBC: 3.44 MIL/uL — ABNORMAL LOW (ref 3.87–5.11)
RDW: 13.9 % (ref 11.5–15.5)
WBC: 20.2 10*3/uL — ABNORMAL HIGH (ref 4.0–10.5)
nRBC: 0 % (ref 0.0–0.2)

## 2022-05-19 LAB — GLUCOSE, CAPILLARY
Glucose-Capillary: 85 mg/dL (ref 70–99)
Glucose-Capillary: 156 mg/dL — ABNORMAL HIGH (ref 70–99)
Glucose-Capillary: 141 mg/dL — ABNORMAL HIGH (ref 70–99)
Glucose-Capillary: 154 mg/dL — ABNORMAL HIGH (ref 70–99)

## 2022-05-19 MED ORDER — FUROSEMIDE 20 MG PO TABS
20.0000 mg | ORAL_TABLET | Freq: Two times a day (BID) | ORAL | Status: DC
Start: 2022-05-19 — End: 2022-05-21
  Administered 2022-05-19 – 2022-05-21 (×4): 20 mg via ORAL
  Filled 2022-05-19 (×4): qty 1

## 2022-05-19 NOTE — Progress Notes (Signed)
POSTPARTUM PROGRESS NOTE  POD #1  Subjective:  Kim Miles is a 31 y.o. G1P0101 s/p LTCS at [redacted]w[redacted]d.  She reports she doing well. No acute events overnight. She reports she is doing well other than feeling tired which she attributes to the magnesium. She denies any problems with ambulating, voiding or po intake. Denies nausea or vomiting. She has not passed flatus. Pain is well controlled.  Lochia is minimal.  Objective: Blood pressure 126/84, pulse 70, temperature 98 F (36.7 C), temperature source Oral, resp. rate 16, height 4\' 11"  (1.499 m), weight 89.4 kg, last menstrual period 09/29/2021, SpO2 95 %, unknown if currently breastfeeding.  Physical Exam:  General: alert, cooperative and no distress Chest: no respiratory distress Heart:regular rate, distal pulses intact Abdomen: soft, nontender,  Uterine Fundus: firm, appropriately tender DVT Evaluation: No calf swelling or tenderness Extremities: no  edema Skin: warm, dry; incision clean/dry/intact w/ pressure dressing in place  Recent Labs    05/18/22 0924 05/19/22 0436  HGB 11.7* 10.7*  HCT 35.3* 31.1*    Assessment/Plan: Zennie Ayars is a 31 y.o. G1P0101 s/p LTCS at [redacted]w[redacted]d for cHTN w/ SIPE, sever IUGR and persistent AEDF .  POD#1 - Doing welll; pain well controlled. H/H appropriate  Routine postpartum care  OOB, ambulated  Lovenox for VTE prophylaxis  cHTN w/ SIPE: currently on magnesium. Will complete at 1230 today. Bps wnl. Also on procardia 30 mg daily. Will continue to monitor and adjust as needed. Consider addition of lasix 20 mg daily.   GDMA1: fasting blood sugar 85. Will continue to monitor.   Anemia: asymptomatic   Contraception: POPs Feeding: breast   Dispo: Plan for discharge pending further improvement.   LOS: 14 days   [redacted]w[redacted]d, MD  Derrel Nip OB Fellow  05/19/2022, 8:11 AM

## 2022-05-20 ENCOUNTER — Ambulatory Visit: Payer: Managed Care, Other (non HMO)

## 2022-05-20 LAB — GLUCOSE, CAPILLARY
Glucose-Capillary: 84 mg/dL (ref 70–99)
Glucose-Capillary: 111 mg/dL — ABNORMAL HIGH (ref 70–99)
Glucose-Capillary: 135 mg/dL — ABNORMAL HIGH (ref 70–99)
Glucose-Capillary: 129 mg/dL — ABNORMAL HIGH (ref 70–99)
Glucose-Capillary: 116 mg/dL — ABNORMAL HIGH (ref 70–99)

## 2022-05-20 LAB — SURGICAL PATHOLOGY

## 2022-05-20 MED ORDER — NIFEDIPINE ER OSMOTIC RELEASE 30 MG PO TB24
30.0000 mg | ORAL_TABLET | Freq: Two times a day (BID) | ORAL | Status: DC
  Administered 2022-05-20 – 2022-05-21 (×2): 30 mg via ORAL
  Filled 2022-05-20 (×2): qty 1

## 2022-05-20 MED ORDER — METFORMIN HCL 500 MG PO TABS
500.0000 mg | ORAL_TABLET | Freq: Two times a day (BID) | ORAL | Status: DC
  Administered 2022-05-20 – 2022-05-21 (×2): 500 mg via ORAL
  Filled 2022-05-20 (×2): qty 1

## 2022-05-20 NOTE — Progress Notes (Signed)
POSTPARTUM PROGRESS NOTE  POD #2  Subjective:  Kim Miles is a 31 y.o. G1P0101 s/p LTCS at [redacted]w[redacted]d.  She reports she doing well. No acute events overnight. She reports she is doing well other than possibly over-exerting herself yesterday. She denies any problems with ambulating, voiding or po intake. Denies nausea or vomiting. Pain is moderately controlled.   Objective: Blood pressure 135/87, pulse 73, temperature 98.3 F (36.8 C), temperature source Oral, resp. rate 16, height 4\' 11"  (1.499 m), weight 89.4 kg, last menstrual period 09/29/2021, SpO2 98 %, unknown if currently breastfeeding.  Physical Exam:  General: alert, cooperative and no distress Chest: no respiratory distress Heart:regular rate noted DVT Evaluation: No calf swelling Extremities: no edema Skin: warm, dry  Recent Labs    05/18/22 0924 05/19/22 0436  HGB 11.7* 10.7*  HCT 35.3* 31.1*    Assessment/Plan: Kim Miles is a 31 y.o. G1P0101 s/p LTCS at [redacted]w[redacted]d for cHTN w/ SIPE, sever IUGR and persistent AEDF .  POD#1 - Doing welll; pain well controlled. H/H appropriate  Routine postpartum care  OOB, ambulated  Lovenox for VTE prophylaxis  cHTN w/ SIPE: s/p magnesium. BPs appropriate.  -Continue procardia 30 mg daily. Will continue to monitor and adjust as needed. -Consider addition of lasix 20 mg daily.   GDMA1: CBG this morning 84.  -Continue to monitor -Can consider metformin   Anemia: asymptomatic   Contraception: POPs Feeding: breast   Dispo: Plan for discharge pending further improvement.   LOS: 15 days   [redacted]w[redacted]d, DO OB Fellow, [facility], Center for [facility] 05/20/2022, 8:57 AM

## 2022-05-20 NOTE — Progress Notes (Signed)
Patient screened out for psychosocial assessment since none of the following apply:  Psychosocial stressors documented in mother or baby's chart  Gestation less than 32 weeks  Code at delivery   Infant with anomalies Please contact the Clinical Social Worker if specific needs arise, by MOB's request, or if MOB scores greater than 9/yes to question 10 on Edinburgh Postpartum Depression Screen.  Taris Galindo, LCSW Clinical Social Worker [facility] Cell#: [phone]     

## 2022-05-20 NOTE — Lactation Note (Signed)
This note was copied from a baby's chart.  NICU Lactation Consultation Note  Patient Name: Kim Miles [id number] Date: 05/20/2022 Age:33 hours   Subjective Reason for consult: Follow-up assessment Pt continues to pump often and bring EBM to NICU for infant. She denies difficulty or + breast changes today. We reviewed volume expectations on day 2 and likely onset of copious milk in the next 1-2 days.   Objective Infant data: Mother's Current Feeding Choice: Breast Milk     Maternal data: G1P0101  C-Section, Low Transverse  Pumping frequency: q3h with drops as expected on day 2   Pump: Personal (DEBP from insurance)  Assessment  Maternal: Milk volume: Normal   Intervention/Plan Interventions: Education  No data recorded Plan: Consult Status: NICU follow-up  NICU Follow-up type: Verify absence of engorgement; Verify onset of copious milk; Weekly NICU follow up; Assist with IDF-1 (Mother to pre-pump before breastfeeding)  Pt to continue pumping q3h  Elder Negus 05/20/2022, 11:16 AM

## 2022-05-21 ENCOUNTER — Other Ambulatory Visit (HOSPITAL_COMMUNITY): Payer: Self-pay

## 2022-05-21 LAB — GLUCOSE, CAPILLARY
Glucose-Capillary: 82 mg/dL (ref 70–99)
Glucose-Capillary: 103 mg/dL — ABNORMAL HIGH (ref 70–99)

## 2022-05-21 MED ORDER — HYDROCODONE-ACETAMINOPHEN 5-325 MG PO TABS
1.0000 | ORAL_TABLET | ORAL | 0 refills | Status: DC | PRN
  Filled 2022-05-21: qty 30, 4d supply, fill #0

## 2022-05-21 MED ORDER — SENNOSIDES-DOCUSATE SODIUM 8.6-50 MG PO TABS
2.0000 | ORAL_TABLET | Freq: Every evening | ORAL | 0 refills | Status: DC | PRN
  Filled 2022-05-21: qty 10, 5d supply, fill #0

## 2022-05-21 MED ORDER — FUROSEMIDE 20 MG PO TABS
20.0000 mg | ORAL_TABLET | Freq: Two times a day (BID) | ORAL | 0 refills | Status: DC
  Filled 2022-05-21: qty 4, 2d supply, fill #0

## 2022-05-21 MED ORDER — METFORMIN HCL 500 MG PO TABS
500.0000 mg | ORAL_TABLET | Freq: Two times a day (BID) | ORAL | 0 refills | Status: DC
  Filled 2022-05-21: qty 60, 30d supply, fill #0

## 2022-05-21 MED ORDER — POLYETHYLENE GLYCOL 3350 17 GM/SCOOP PO POWD
17.0000 g | Freq: Every day | ORAL | 1 refills | Status: DC
  Filled 2022-05-21: qty 238, 14d supply, fill #0

## 2022-05-21 MED ORDER — SIMETHICONE 80 MG PO CHEW
80.0000 mg | CHEWABLE_TABLET | Freq: Four times a day (QID) | ORAL | 0 refills | Status: DC | PRN
  Filled 2022-05-21: qty 30, 8d supply, fill #0

## 2022-05-21 MED ORDER — IBUPROFEN 600 MG PO TABS
600.0000 mg | ORAL_TABLET | Freq: Four times a day (QID) | ORAL | 0 refills | Status: DC | PRN
  Filled 2022-05-21: qty 30, 8d supply, fill #0

## 2022-05-21 MED ORDER — NIFEDIPINE ER 30 MG PO TB24
30.0000 mg | ORAL_TABLET | Freq: Two times a day (BID) | ORAL | 0 refills | Status: DC
  Filled 2022-05-21: qty 84, 42d supply, fill #0

## 2022-05-21 NOTE — Discharge Instructions (Addendum)
Continue to check your blood pressures twice a day Call the office for blood pressures that are consistently above 145 for the top number or 95 for the bottom number   Hypertension During Pregnancy Hypertension is also called high blood pressure. High blood pressure means that the force of your blood moving in your body is too strong. It can cause problems for you and your baby. Different types of high blood pressure can happen during pregnancy. The types are: High blood pressure before you got pregnant. This is called chronic hypertension.  This can continue during your pregnancy. Your doctor will want to keep checking your blood pressure. You may need medicine to keep your blood pressure under control while you are pregnant. You will need follow-up visits after you have your baby. High blood pressure that goes up during pregnancy when it was normal before. This is called gestational hypertension. It will usually get better after you have your baby, but your doctor will need to watch your blood pressure to make sure that it is getting better. Very high blood pressure during pregnancy. This is called preeclampsia. Very high blood pressure is an emergency that needs to be checked and treated right away. You may develop very high blood pressure after giving birth. This is called postpartum preeclampsia. This usually occurs within 48 hours after childbirth but may occur up to 6 weeks after giving birth. This is rare. How does this affect me? If you have high blood pressure during pregnancy, you have a higher chance of developing high blood pressure: As you get older. If you get pregnant again. In some cases, high blood pressure during pregnancy can cause: Stroke. Heart attack. Damage to the kidneys, lungs, or liver. Preeclampsia. Jerky movements you cannot control (convulsions or seizures). Problems with the placenta.  What can I do to lower my risk?  Keep a healthy weight. Eat a healthy  diet. Follow what your doctor tells you about treating any medical problems that you had before becoming pregnant. It is very important to go to all of your doctor visits. Your doctor will check your blood pressure and make sure that your pregnancy is progressing as it should. Treatment should start early if a problem is found.  Follow these instructions at home:  Take your blood pressure 1-2 times per day. Call the office if your blood pressure is 155 or higher for the top number or 105 or higher for the bottom number.    Eating and drinking  Drink enough fluid to keep your pee (urine) pale yellow. Avoid caffeine. Lifestyle Do not use any products that contain nicotine or tobacco, such as cigarettes, e-cigarettes, and chewing tobacco. If you need help quitting, ask your doctor. Do not use alcohol or drugs. Avoid stress. Rest and get plenty of sleep. Regular exercise can help. Ask your doctor what kinds of exercise are best for you. General instructions Take over-the-counter and prescription medicines only as told by your doctor. Keep all prenatal and follow-up visits as told by your doctor. This is important. Contact a doctor if: You have symptoms that your doctor told you to watch for, such as: Headaches. Nausea. Vomiting. Belly (abdominal) pain. Dizziness. Light-headedness. Get help right away if: You have: Very bad belly pain that does not get better with treatment. A very bad headache that does not get better. Vomiting that does not get better. Sudden, fast weight gain. Sudden swelling in your hands, ankles, or face. Blood in your pee. Blurry vision. Double vision.  Shortness of breath. Chest pain. Weakness on one side of your body. Trouble talking. Summary High blood pressure is also called hypertension. High blood pressure means that the force of your blood moving in your body is too strong. High blood pressure can cause problems for you and your baby. Keep all  follow-up visits as told by your doctor. This is important. This information is not intended to replace advice given to you by your health care provider. Make sure you discuss any questions you have with your health care provider. Document Released: 11/14/2010 Document Revised: 02/02/2019 Document Reviewed: 11/08/2018 Elsevier Patient Education  2020 [employer]   Cesarean Delivery, Care After Refer to this sheet in the next few weeks. These instructions provide you with information on caring for yourself after your procedure. Your health care provider may also give you specific instructions. Your treatment has been planned according to current medical practices, but problems sometimes occur. Call your health care provider if you have any problems or questions after you go home. HOME CARE INSTRUCTIONS   Only take over-the-counter or prescription medications as directed by your health care provider. Do not drink alcohol, especially if you are breastfeeding or taking medication to relieve pain. Do not  smoke tobacco. Continue to use good perineal care. Good perineal care includes: Wiping your perineum from front to back. Keeping your perineum clean. Check your surgical cut (incision) daily for increased redness, drainage, swelling, or separation of skin. Shower and clean your incision gently with soap and water every day, by letting warm and soapy water run over the incision, and then pat it dry. If your health care provider says it is okay, leave the incision uncovered. Use a bandage (dressing) if the incision is draining fluid or appears irritated. If the adhesive strips across the incision do not fall off within 7 days, carefully peel them off, after a shower. Hug a pillow when coughing or sneezing until your incision is healed. This helps to relieve pain. Do not use tampons, douches or have sexual intercourse, until your health care provider says it is okay. Wear a well-fitting bra that  provides breast support. Limit wearing support panties or control-top hose. Drink enough fluids to keep your urine clear or pale yellow. Eat high-fiber foods such as whole grain cereals and breads, brown rice, beans, and fresh fruits and vegetables every day. These foods may help prevent or relieve constipation. Resume activities such as climbing stairs, driving, lifting, exercising, or traveling as directed by your health care provider. Try to have someone help you with your household activities and your newborn for at least a few days after you leave the hospital. Rest as much as possible. Try to rest or take a nap when your newborn is sleeping. Increase your activities gradually. Do not lift more than 15lbs until directed by a provider. Keep all of your scheduled postpartum appointments. It is very important to keep your scheduled follow-up appointments. At these appointments, your health care provider will be checking to make sure that you are healing physically and emotionally. SEEK MEDICAL CARE IF:  You are passing large clots from your vagina. Save any clots to show your health care provider. You have a foul smelling discharge from your vagina. You have trouble urinating. You are urinating frequently. You have pain when you urinate. You have a change in your bowel movements. You have increasing redness, pain, or swelling near your incision. You have pus draining from your incision. Your incision is separating. You  have painful, hard, or reddened breasts. You have a severe headache. You have blurred vision or see spots. You feel sad or depressed. You have thoughts of hurting yourself or your newborn. You have questions about your care, the care of your newborn, or medications. You are dizzy or light-headed. You have a rash. You have pain, redness, or swelling at the site of the removed intravenous access (IV) tube. You have nausea or vomiting. You stopped breastfeeding and have  not had a menstrual period within 12 weeks of stopping. You are not breastfeeding and have not had a menstrual period within 12 weeks of delivery. You have a fever. SEEK IMMEDIATE MEDICAL CARE IF: You have persistent pain. You have chest pain. You have shortness of breath. You faint. You have leg pain. You have stomach pain. Your vaginal bleeding saturates 2 or more sanitary pads in 1 hour. MAKE SURE YOU:  Understand these instructions. Will watch your condition. Will get help right away if you are not doing well or get worse. Document Released: 07/04/2002 Document Revised: 02/26/2014 Document Reviewed: 06/08/2012 [facility] Patient Information 2015 [place], [place]. This information is not intended to replace advice given to you by your health care provider. Make sure you discuss any questions you have with your health care provider.

## 2022-05-21 NOTE — Progress Notes (Signed)
Discharge instructions an prescriptions given to pt. Discussed post c-section care, signs and symptoms to report to the MD, upcoming appointments, and meds. Pt verbalizes understanding and has no questions or concerns at this time. Pt discharged home from hospital in stable condition.

## 2022-05-25 ENCOUNTER — Ambulatory Visit (INDEPENDENT_AMBULATORY_CARE_PROVIDER_SITE_OTHER): Payer: Managed Care, Other (non HMO) | Admitting: *Deleted

## 2022-05-25 ENCOUNTER — Ambulatory Visit: Payer: Managed Care, Other (non HMO)

## 2022-05-25 DIAGNOSIS — O10919 Unspecified pre-existing hypertension complicating pregnancy, unspecified trimester: Secondary | ICD-10-CM

## 2022-05-25 DIAGNOSIS — O1093 Unspecified pre-existing hypertension complicating the puerperium: Secondary | ICD-10-CM

## 2022-05-25 DIAGNOSIS — Z98891 History of uterine scar from previous surgery: Secondary | ICD-10-CM

## 2022-05-25 NOTE — Progress Notes (Signed)
Subjective:  Kim Miles is a 31 y.o. female here for BP check and incision check.  Hypertension ROS: taking medications as instructed, no medication side effects noted, no TIA's, no chest pain on exertion, no dyspnea on exertion, and no swelling of ankles.   Pt reports incision covered with honeycomb.   Objective:  BP (!) 137/96   Pulse (!) 102   Breastfeeding Yes   Appearance alert, well appearing, and in no distress. Incision healing well, no significant drainage, no dehiscence, no significant erythema   Assessment:   Blood Pressure today in office stable.  Incision healed nicely, no signs of infection, scar looks good  Plan:  Dr Macon Large made aware of BP, pt can continue current regimen and follow up as needed and or at postpartum visit .   Scheryl Marten, RN

## 2022-05-26 ENCOUNTER — Ambulatory Visit: Payer: Self-pay

## 2022-05-26 NOTE — Lactation Note (Signed)
This note was copied from a baby's chart.  NICU Lactation Consultation Note  Patient Name: Kim Miles [id number] Date: 05/26/2022 Age:31 years  Subjective Reason for consult: Follow-up assessment; Maternal endocrine disorder; NICU baby; Late-preterm 34-36.6wks; Primapara; 1st time breastfeeding (GDM (diet))  Visited with mom of 82 48/40 weeks old (adjusted) NICU female, she's a P1 and reports she's pumping but not consistently; she voiced that it's been challenging to keep up with the 3 hour schedule. Explained the importance of consistent pumping to protect her supply. Ms. Walsworth denies any S/S of engorgement at this time. Reviewed pumping schedule, feeding cues, IDF 1/2 and strategies to increase her supply.  Objective Infant data: Mother's Current Feeding Choice: Breast Milk and Donor Milk  Infant feeding assessment Scale for Readiness: 3  Maternal data: G1P0101  C-Section, Low Transverse Current breast feeding challenges:: NICU admission Pumping frequency: 3 times/24 hours Pumped volume: 30 mL Flange Size: 24 Risk factor for low milk supply:: infrequent pumping Pump: Personal (DEBP from insurance)  Assessment Infant: In NICU  Maternal: Milk volume: Low  Intervention/Plan Interventions: Breast feeding basics reviewed; DEBP; Education; [employer]; Infant Driven Feeding Algorithm education Tools: Pump; Flanges Pump Education: Setup, frequency, and cleaning; Milk Storage  Plan of care: Encouraged mom to try pumping every 3 hours, at least 8 pumping sessions/24 hours She'll power pump in the AM and will try not going more than 6 hours without pumping at night She'll call for latch assistance once baby is ready to go to breast   FOB present. All questions and concerns answered, family to contact [facility] services PRN.  Consult Status: NICU follow-up  NICU Follow-up type: Weekly NICU follow up; Assist with IDF-1 (Mother to pre-pump before breastfeeding)   Nedra Mcinnis S  Takeyah Wieman 05/26/2022, 3:02 PM

## 2022-05-27 ENCOUNTER — Ambulatory Visit: Payer: Managed Care, Other (non HMO)

## 2022-06-06 ENCOUNTER — Ambulatory Visit: Payer: Self-pay

## 2022-06-06 NOTE — Lactation Note (Signed)
This note was copied from a baby's chart.  NICU Lactation Consultation Note  Patient Name: Kim Miles [id number] Date: 06/06/2022 Age:31 years old  Subjective Reason for consult: NICU baby; Maternal endocrine disorder; Infant < 6lbs; Follow-up assessment; Late-preterm 34-36.6wks; 1st time breastfeeding; Primapara  Visited with mom of 75 93/66 weeks old (adjusted) NICU female, she's a P1 and reports an increase in her supply since she started pumping every 3 hours, praised her for her efforts. [name] voiced that pumping is going well, no pain/discomfort so far, she continues to use her nipple butter for breast care. Reviewed feeding cues, IDF 1/2 and lactogenesis III. She's aware that her breast will need to be empty when she starts taking baby to breast for some "lick & learn".  Objective Infant data: Mother's Current Feeding Choice: Breast Milk and Donor Milk  Infant feeding assessment Scale for Readiness: 3  Maternal data: G1P0101  C-Section, Low Transverse Pumping frequency: 7 times/24 hours Pumped volume: 60 mL (up to 120 ml in the AM) Flange Size: 24 Pump: Personal (DEBP from insurance)  Assessment Infant: In NICU  Maternal: Milk volume: Low  Intervention/Plan Interventions: Breast feeding basics reviewed; Education; DEBP; Infant Driven Feeding Algorithm education Tools: Pump; Flanges Pump Education: Setup, frequency, and cleaning; Milk Storage  Plan of care: Encouraged mom to continue pumping every 3 hours, at least 8 pumping sessions/24 hours She'll power pump in the AM  She'll call for latch assistance once baby is ready to go to breast    FOB present. All questions and concerns answered, family to contact [facility] services PRN.  Consult Status: NICU follow-up  NICU Follow-up type: Weekly NICU follow up; Assist with IDF-1 (Mother to pre-pump before breastfeeding)   Kim Miles 06/06/2022, 6:15 PM

## 2022-06-07 ENCOUNTER — Inpatient Hospital Stay (HOSPITAL_COMMUNITY)
Admission: AD | Admit: 2022-06-07 | Discharge: 2022-06-08 | Disposition: A | Discharge status: Home / Self Care | Payer: Managed Care, Other (non HMO) | Attending: Obstetrics and Gynecology | Admitting: Obstetrics and Gynecology

## 2022-06-07 ENCOUNTER — Other Ambulatory Visit: Payer: Self-pay

## 2022-06-07 DIAGNOSIS — Z79899 Other long term (current) drug therapy: Secondary | ICD-10-CM | POA: Insufficient documentation

## 2022-06-07 DIAGNOSIS — O165 Unspecified maternal hypertension, complicating the puerperium: Secondary | ICD-10-CM

## 2022-06-07 DIAGNOSIS — Z98891 History of uterine scar from previous surgery: Secondary | ICD-10-CM

## 2022-06-08 ENCOUNTER — Telehealth: Payer: Self-pay | Admitting: Family Medicine

## 2022-06-08 ENCOUNTER — Encounter (HOSPITAL_BASED_OUTPATIENT_CLINIC_OR_DEPARTMENT_OTHER): Payer: Self-pay | Admitting: Obstetrics and Gynecology

## 2022-06-08 ENCOUNTER — Encounter (HOSPITAL_COMMUNITY): Payer: Self-pay | Admitting: Obstetrics and Gynecology

## 2022-06-08 ENCOUNTER — Inpatient Hospital Stay (HOSPITAL_COMMUNITY): Payer: Managed Care, Other (non HMO)

## 2022-06-08 DIAGNOSIS — Z79899 Other long term (current) drug therapy: Secondary | ICD-10-CM | POA: Diagnosis not present

## 2022-06-08 DIAGNOSIS — O165 Unspecified maternal hypertension, complicating the puerperium: Secondary | ICD-10-CM | POA: Diagnosis not present

## 2022-06-08 LAB — COMPREHENSIVE METABOLIC PANEL
ALT: 18 U/L (ref 0–44)
AST: 23 U/L (ref 15–41)
Albumin: 2.9 g/dL — ABNORMAL LOW (ref 3.5–5.0)
Alkaline Phosphatase: 76 U/L (ref 38–126)
Anion gap: 8 (ref 5–15)
BUN: 19 mg/dL (ref 6–20)
CO2: 23 mmol/L (ref 22–32)
Calcium: 8.7 mg/dL — ABNORMAL LOW (ref 8.9–10.3)
Chloride: 105 mmol/L (ref 98–111)
Creatinine, Ser: 0.75 mg/dL (ref 0.44–1.00)
GFR, Estimated: >60 mL/min (ref >60)
Glucose, Bld: 107 mg/dL — ABNORMAL HIGH (ref 70–99)
Potassium: 3.7 mmol/L (ref 3.5–5.1)
Sodium: 136 mmol/L (ref 135–145)
Total Bilirubin: 0.2 mg/dL — ABNORMAL LOW (ref 0.3–1.2)
Total Protein: 6.1 g/dL — ABNORMAL LOW (ref 6.5–8.1)

## 2022-06-08 LAB — CBC
HCT: 32 % — ABNORMAL LOW (ref 36.0–46.0)
Hemoglobin: 10.7 g/dL — ABNORMAL LOW (ref 12.0–15.0)
MCH: 30.3 pg (ref 26.0–34.0)
MCHC: 33.4 g/dL (ref 30.0–36.0)
MCV: 90.7 fL (ref 80.0–100.0)
Platelets: 356 10*3/uL (ref 150–400)
RBC: 3.53 MIL/uL — ABNORMAL LOW (ref 3.87–5.11)
RDW: 13 % (ref 11.5–15.5)
WBC: 9.2 10*3/uL (ref 4.0–10.5)
nRBC: 0 % (ref 0.0–0.2)

## 2022-06-08 MED ORDER — LABETALOL HCL 5 MG/ML IV SOLN: 80.0000 mg | INTRAVENOUS | Status: DC | PRN

## 2022-06-08 MED ORDER — NIFEDIPINE ER OSMOTIC RELEASE 30 MG PO TB24: 30.0000 mg | ORAL_TABLET | Freq: Every day | ORAL | Status: DC

## 2022-06-08 MED ORDER — LABETALOL HCL 5 MG/ML IV SOLN: 20.0000 mg | INTRAVENOUS | Status: DC | PRN

## 2022-06-08 MED ORDER — NIFEDIPINE ER OSMOTIC RELEASE 30 MG PO TB24
30.0000 mg | ORAL_TABLET | Freq: Every day | ORAL | Status: DC
  Administered 2022-06-08: 30 mg via ORAL
  Filled 2022-06-08: qty 1

## 2022-06-08 MED ORDER — HYDRALAZINE HCL 20 MG/ML IJ SOLN: 10.0000 mg | INTRAMUSCULAR | Status: DC | PRN

## 2022-06-08 MED ORDER — LABETALOL HCL 5 MG/ML IV SOLN: 40.0000 mg | INTRAVENOUS | Status: DC | PRN

## 2022-06-08 NOTE — MAU Note (Addendum)
.  Kim Miles is a 31 y.o. at postpartum c/s on 7/24 here in MAU reporting: cramping all evenings since around 2000, cramping got bad about 45 minutes ago.. went to bathroom felt something come out of vagina, thought it was a blood clot, "but it was something else" pt states she brought it with her, reports it looked like tissue. States bleeding had increased since passing this, reports "I pretty much had stopped bleeding before" since her c/s. States cramping is better now.. took midol an hour ago for pain. Denies any other pain or complications. Denies HA or visual disturbances.   Pt states her c/s was for preeclampsia and IUGR sent home with nifedipine; taking 30 mg once a day for BP - last dose Saturday night at 2200  Onset of complaint: 2000 Pain score: 1 - cramping  Vitals:   06/08/22 0016  BP: (!) 146/102  Pulse: 85  Resp: 17  Temp: 98 F (36.7 C)  SpO2: 99%     FHT:pt is postpartum.  Lab orders placed from triage:  none.

## 2022-06-08 NOTE — Discharge Instructions (Signed)
Our surgery coordinator will call to get your D&C surgery scheduled. She should contact you by phone or MyChart.

## 2022-06-08 NOTE — Telephone Encounter (Signed)
Requested medication (s) are due for refill today: yes  Requested medication (s) are on the active medication list: yes  Last refill:  06/11/21 #90/3  Future visit scheduled: no  Notes to clinic:  pt is overdue for appt but currently admitted in hospital r/t postpartum and delivery complications.      Requested Prescriptions  Pending Prescriptions Disp Refills   escitalopram (LEXAPRO) 10 MG tablet [Pharmacy Med Name: ESCITALOPRAM 10 MG TABLET 10 Tablet] 90 tablet 0    Sig: TAKE 1 TABLET BY MOUTH AT BEDTIME     Psychiatry:  Antidepressants - SSRI Failed - 06/08/2022  9:14 AM      Failed - Valid encounter within last 6 months    Recent Outpatient Visits           12 months ago Prediabetes   [facility] Caro Laroche, DO   1 year ago Viral URI   [facility] Caro Laroche, DO   2 years ago Annual physical exam   [facility] Danelle Berry, PA-C   2 years ago Hypertension, unspecified type   [facility] Danelle Berry, PA-C   2 years ago Hypertension, unspecified type   [facility] [place] [place], PA-C              Passed - Completed PHQ-2 or PHQ-9 in the last 360 days

## 2022-06-08 NOTE — Progress Notes (Signed)
Spoke w/ via phone for pre-op interview--- pt Lab needs dos----  [employer], ekg (per anes)/  pre-op orders pending            Lab results------ no COVID test -----patient states asymptomatic no test needed Arrive at ------- 0630 on 06-10-2022 NPO after MN NO Solid Food.  Clear liquids from MN until--- 0530 Med rec completed Medications to take morning of surgery ----- none Diabetic medication ----- n/a Patient instructed no nail polish to be worn day of surgery Patient instructed to bring photo id and insurance card day of surgery Patient aware to have Driver (ride ) / caregiver for 24 hours after surgery -- husband, [place] Patient Special Instructions ----- n/a Pre-Op special Istructions ----- case just added on today,  pre-op orders pending Patient verbalized understanding of instructions that were given at this phone interview. Patient denies shortness of breath, chest pain, fever, cough at this phone interview.

## 2022-06-08 NOTE — MAU Provider Note (Signed)
History     CSN: [id number]  Arrival date and time: 06/07/22 2356   Event Date/Time   First Provider Initiated Contact with Patient 06/08/22 0046      Chief Complaint  Patient presents with   Vaginal Bleeding   Abdominal Pain   HPI [name] is a 31 y.o. year old [id number] PP female s/p C/S @ 33 wks for severe PEC, IUGR and BPP 4/8 on 05/18/2022 who presents to MAU reporting cramping all evening that started at 2000. She reports the cramping increased about 45 mins prior to her arrival in MAU. She went to the [facility] and felt something come out her vagina. She first thought it was a blood clot, but then saw "it was something else; like tissue." She brought it with her to MAU. She reports her VB increased since passing that tissue. She states "my VB pretty much stopped prior to now since my C/S. She reports her cramping has improved now after taking Midol about 1 hour prior to arriving to MAU. She was d/c'd on Procardia 30 mg daily; last dose was Saturday 06/06/2022 @ 2000. She has not taken her Sunday dose yet d/t the cramping and passing that tissue. Her spouse reports she had "placenta perfusion problems which caused FGR." She receives [facility] with [facility]; next appt is 06/30/2022. Her spouse is present and contributing to the history taking.    OB History     Gravida  1   Para  1   Term      Preterm  1   AB      Living  1      SAB      IAB      Ectopic      Multiple  0   Live Births  1           Past Medical History:  Diagnosis Date   Class 2 obesity with body mass index (BMI) of 36.0 to 36.9 in adult 01/01/2020   some weight loss with diet and lifestyle efforts, encouraged her to continue working on adding lean meat, veggies, decrease carbs/calorie increase exercise   Depression    Dysmenorrhea 2001   Hepatitis C antibody test positive-RNA negative  01/06/2022   Hypertension    Menstrual migraine without status migrainosus, not intractable 01/01/2020   well  controlled with junel-fe - pt skipping placebo and withdrawal bleed - reviewed OCP, monitoring, SE/risk.   Primary dysmenorrhea 03/31/2017   Treated with OCPs effectively. Hx of hospitalization at age 38yo for heavy prolonged period (anemia?).  Formatting of this note might be different from the original. Treated with OCPs effectively. Hx of hospitalization at age 12yo for heavy prolonged period (anemia?).    Past Surgical History:  Procedure Laterality Date   CESAREAN SECTION N/A 05/18/2022   Procedure: CESAREAN SECTION;  Surgeon: Clarnce Flock, MD;  Location: [facility] LD ORS;  Service: Obstetrics;  Laterality: N/A;   NO PAST SURGERIES      Family History  Problem Relation Age of Onset   Asthma Mother    Hypertension Father    Anemia Sister    Eating disorder Sister    Cancer Maternal Grandmother    Cancer Maternal Grandfather    Diabetes Maternal Grandfather    Alzheimer's disease Maternal Grandfather    Heart attack Paternal Grandmother    Arthritis Paternal Grandmother     Social History   Tobacco Use   Smoking status: Never   Smokeless tobacco: Never  Vaping Use   Vaping Use: Never used  Substance Use Topics   Alcohol use: Never   Drug use: Never    Allergies: No Known Allergies  Medications Prior to Admission  Medication Sig Dispense Refill Last Dose   escitalopram (LEXAPRO) 10 MG tablet Take 1 tablet (10 mg total) by mouth at bedtime. 90 tablet 3 06/06/2022   metFORMIN (GLUCOPHAGE) 500 MG tablet Take 1 tablet (500 mg total) by mouth 2 (two) times daily with a meal. 60 tablet 0 06/06/2022   Prenatal MV & Min w/FA-DHA (PRENATAL GUMMIES PO) Take 2 tablets by mouth daily.   06/07/2022   ACCU-CHEK GUIDE test strip USE UP TO FOUR TIMES DAILY AS DIRECTED.      Accu-Chek Softclix Lancets lancets SMARTSIG:Topical 1-4 Times Daily      blood glucose meter kit and supplies KIT Dispense based on patient and insurance preference. Use up to four times daily as directed. 1 each 0     calcium carbonate (TUMS - DOSED IN MG ELEMENTAL CALCIUM) 500 MG chewable tablet Chew 500 mg by mouth daily as needed for indigestion or heartburn. (Patient not taking: Reported on 06/08/2022)   Not Taking   furosemide (LASIX) 20 MG tablet Take 1 tablet (20 mg total) by mouth 2 (two) times daily for 2 days. 4 tablet 0    HYDROcodone-acetaminophen (NORCO/VICODIN) 5-325 MG tablet Take 1-2 tablets by mouth every 4 (four) hours as needed for moderate pain. 30 tablet 0    ibuprofen (ADVIL) 600 MG tablet Take 1 tablet (600 mg total) by mouth every 6 (six) hours as needed. (Patient not taking: Reported on 06/08/2022) 30 tablet 0 Not Taking   NIFEdipine (ADALAT CC) 30 MG 24 hr tablet Take 1 tablet (30 mg total) by mouth 2 (two) times daily. 84 tablet 0 06/06/2022 at 2200   polyethylene glycol powder (GLYCOLAX/MIRALAX) 17 GM/SCOOP powder Take 17 g by mouth daily. (Patient not taking: Reported on 06/08/2022) 238 g 1 Not Taking   senna-docusate (SENOKOT-S) 8.6-50 MG tablet Take 2 tablets by mouth at bedtime as needed for mild constipation. (Patient not taking: Reported on 06/08/2022) 10 tablet 0 Not Taking   simethicone (MYLICON) 80 MG chewable tablet Chew 1 tablet (80 mg total) by mouth 4 (four) times daily as needed for flatulence. 30 tablet 0     Review of Systems  Constitutional: Negative.   HENT: Negative.    Eyes: Negative.   Respiratory: Negative.    Cardiovascular: Negative.   Gastrointestinal: Negative.   Endocrine: Negative.   Genitourinary:  Positive for vaginal bleeding (increased today and immediately after passing tissue, but now only spotting). Negative for pelvic pain (cramping earlier, but none now).  Musculoskeletal: Negative.   Skin: Negative.   Allergic/Immunologic: Negative.   Neurological: Negative.   Hematological: Negative.   Psychiatric/Behavioral: Negative.     Physical Exam  Patient Vitals for the past 24 hrs:  BP Temp Temp src Pulse Resp SpO2 Height Weight  06/08/22 0415 (!)  153/99 -- -- 71 -- 97 % -- --  06/08/22 0400 (!) 143/98 -- -- 74 -- 98 % -- --  06/08/22 0346 (!) 148/100 -- -- 70 -- -- -- --  06/08/22 0334 (!) 145/101 -- -- 71 -- -- -- --  06/08/22 0235 (!) 151/104 -- -- 82 -- 97 % -- --  06/08/22 0145 (!) 140/95 -- -- 74 -- 95 % -- --  06/08/22 0130 (!) 154/103 -- -- 75 -- 97 % -- --  06/08/22  0115 (!) 142/99 -- -- 76 -- 92 % -- --  06/08/22 0101 (!) 144/99 -- -- 80 -- -- -- --  06/08/22 0046 (!) 146/101 -- -- 80 -- -- -- --  06/08/22 0030 (!) 147/105 -- -- 80 -- -- -- --  06/08/22 0016 (!) 146/102 98 F (36.7 C) Oral 85 17 99 % 4' 11"  (1.499 m) 87.5 kg     Physical Exam Vitals and nursing note reviewed. Exam conducted with a chaperone present.  Constitutional:      Appearance: Normal appearance. She is obese.  Cardiovascular:     Rate and Rhythm: Normal rate.  Pulmonary:     Effort: Pulmonary effort is normal.  Abdominal:     Palpations: Abdomen is soft.  Genitourinary:    General: Normal vulva.     Comments: 3 tiny less than pencil eraser sized spots on peripad Skin:    General: Skin is warm and dry.  Neurological:     Mental Status: She is alert and oriented to person, place, and time.  Psychiatric:        Mood and Affect: Mood normal.        Behavior: Behavior normal.        Thought Content: Thought content normal.        Judgment: Judgment normal.          Reassessment @ 0330: Called to come to U/S dept to evaluate patient's bleeding. Small amount of BRB smeared on inside of both inner thighs from prolonged lying in bed prior to U/S. Fundal massage performed with expelling of blood or blood clots. Reassurance given to patient and spouse. Ultrasonographer ok'd to proceed with TVUS.  Reassessment @ 0440: Discussed results of U/S with patient and spouse and Dr. Marjory Lies recommendations for [facility]. Patient verbalized an understanding of the plan of care and agrees.   MAU Course  Procedures  MDM CCUA CBC CMP Saline  Lock Serial BP's  Limited Pelvic U/S Specimen to Surgical Pathology  Results for orders placed or performed during the hospital encounter of 06/07/22 (from the past 24 hour(s))  CBC     Status: Abnormal   Collection Time: 06/08/22  1:31 AM  Result Value Ref Range   WBC 9.2 4.0 - 10.5 K/uL   RBC 3.53 (L) 3.87 - 5.11 MIL/uL   Hemoglobin 10.7 (L) 12.0 - 15.0 g/dL   HCT 32.0 (L) 36.0 - 46.0 %   MCV 90.7 80.0 - 100.0 fL   MCH 30.3 26.0 - 34.0 pg   MCHC 33.4 30.0 - 36.0 g/dL   RDW 13.0 11.5 - 15.5 %   Platelets 356 150 - 400 K/uL   nRBC 0.0 0.0 - 0.2 %  Comprehensive metabolic panel     Status: Abnormal   Collection Time: 06/08/22  1:31 AM  Result Value Ref Range   Sodium 136 135 - 145 mmol/L   Potassium 3.7 3.5 - 5.1 mmol/L   Chloride 105 98 - 111 mmol/L   CO2 23 22 - 32 mmol/L   Glucose, Bld 107 (H) 70 - 99 mg/dL   BUN 19 6 - 20 mg/dL   Creatinine, Ser 0.75 0.44 - 1.00 mg/dL   Calcium 8.7 (L) 8.9 - 10.3 mg/dL   Total Protein 6.1 (L) 6.5 - 8.1 g/dL   Albumin 2.9 (L) 3.5 - 5.0 g/dL   AST 23 15 - 41 U/L   ALT 18 0 - 44 U/L   Alkaline Phosphatase 76 38 - 126 U/L  Total Bilirubin 0.2 (L) 0.3 - 1.2 mg/dL   GFR, Estimated >60 >60 mL/min   Anion gap 8 5 - 15      US PELVIS TRANSVAGINAL NON-OB (TV ONLY)  Result Date: 06/08/2022 CLINICAL DATA:  Postpartum hemorrhage, status post cesarean section on May 18, 2022 sent to rule out retained products of conception. EXAM: TRANSABDOMINAL AND TRANSVAGINAL ULTRASOUND OF PELVIS TECHNIQUE: Both transabdominal and transvaginal ultrasound examinations of the pelvis were performed. Transabdominal technique was performed for global imaging of the pelvis including uterus, ovaries, adnexal regions, and pelvic cul-de-sac. It was necessary to proceed with endovaginal exam following the transabdominal exam to visualize the endometrium. COMPARISON:  None Available. FINDINGS: Uterus Measurements: 10.0 cm x 4.6 cm x 6.4 cm = volume: 157.3 mL. No fibroids or  other mass visualized. Endometrium Thickness: 11.1 mm. Vascular areas are noted within the endometrium. Right ovary The right ovary is not visualized. Left ovary The left ovary is not visualized. Other findings No abnormal free fluid. A 0.9 cm x 0.9 cm x 2.2 cm well-defined, anechoic structure is seen near the cesarean section site. No abnormal flow is seen within this region on color Doppler evaluation. IMPRESSION: 1. Thickened endometrium which contains areas of vascularity consistent with retained products of conception. 2. Cystic structure near the cesarean section area which may represent a small hematoma. Electronically Signed   By: Virgina Norfolk M.D.   On: 06/08/2022 04:27   US Pelvis Limited  Result Date: 06/08/2022 CLINICAL DATA:  Postpartum hemorrhage, status post C-section on April 18, 2022 presenting with pelvic pain. Rule out retained products of conception. EXAM: TRANSABDOMINAL AND TRANSVAGINAL ULTRASOUND OF PELVIS TECHNIQUE: Both transabdominal and transvaginal ultrasound examinations of the pelvis were performed. Transabdominal technique was performed for global imaging of the pelvis including uterus, ovaries, adnexal regions, and pelvic cul-de-sac. It was necessary to proceed with endovaginal exam following the transabdominal exam to visualize the endometrium. COMPARISON:  May 18, 2022 FINDINGS: Uterus Measurements: 10.0 cm x 4.6 cm x 6.4 cm = volume: 157.3 mL. No fibroids or other mass visualized. Endometrium Thickness: 11.1 mm. Vascular areas are noted within the endometrium. Right ovary The right ovary is not visualized. Left ovary The left ovary is not visualized. Other findings No abnormal free fluid. A 0.9 cm x 0.9 cm x 2.2 cm well-defined, anechoic structure is seen near the cesarean section site. No abnormal flow is seen within this region on color Doppler evaluation. IMPRESSION: 1. Thickened endometrium which contains areas of vascularity consistent with retained products of  conception. 2. Cystic structure near the cesarean section area which may represent a small hematoma. Electronically Signed   By: Virgina Norfolk M.D.   On: 06/08/2022 04:26    *Consult with Dr. Rip Harbour @ [phone] - notified of patient's complaints, assessments, lab & U/S results, recommended tx plan get patient scheduled for D&C - ok to d/c home Assessment and Plan  1. Retained products of conception, postpartum - Scheduled D&C -- msg sent to [facility]  2. Status post cesarean delivery  3. Hypertension, postpartum condition or complication - Continue Procardia XL 30 mg po qd as previously prescribed.  - Discharge patient - Keep scheduled appt with [facility] on 06/30/2022 - Someone from our dept will call you about scheduling D&C for retained POC - Patient verbalized an understanding of the plan of care and agrees.    Laury Deep, CNM 06/08/2022, 12:46 AM

## 2022-06-09 LAB — SURGICAL PATHOLOGY

## 2022-06-09 NOTE — Anesthesia Preprocedure Evaluation (Signed)
Anesthesia Evaluation  Patient identified by MRN, date of birth, ID band Patient awake    Reviewed: Allergy & Precautions, NPO status , Patient's Chart, lab work & pertinent test results  Airway Mallampati: III  TM Distance: >3 FB Neck ROM: Full    Dental  (+) Teeth Intact, Dental Advisory Given   Pulmonary neg pulmonary ROS,    Pulmonary exam normal breath sounds clear to auscultation       Cardiovascular hypertension (130/104 in preop, has been on nifedipine since pregnancy), Pt. on medications Normal cardiovascular exam Rhythm:Regular Rate:Normal     Neuro/Psych  Headaches, PSYCHIATRIC DISORDERS Depression    GI/Hepatic negative GI ROS, (+) Hepatitis - (HCV RNA positive 12/2021), C  Endo/Other  diabetes, GestationalBMI 39  Renal/GU negative Renal ROS  negative genitourinary   Musculoskeletal negative musculoskeletal ROS (+)   Abdominal (+) + obese,   Peds  Hematology  (+) Blood dyscrasia, anemia , Hb 10.7, plt 356   Anesthesia Other Findings   Reproductive/Obstetrics S/p csection 7/24, retained POC                           Anesthesia Physical Anesthesia Plan  ASA: 3  Anesthesia Plan: General   Post-op Pain Management: Tylenol PO (pre-op)* and Toradol IV (intra-op)*   Induction: Intravenous  PONV Risk Score and Plan: 4 or greater and Ondansetron, Dexamethasone, Midazolam and Treatment may vary due to age or medical condition  Airway Management Planned: LMA  Additional Equipment: None  Intra-op Plan:   Post-operative Plan: Extubation in OR  Informed Consent: I have reviewed the patients History and Physical, chart, labs and discussed the procedure including the risks, benefits and alternatives for the proposed anesthesia with the patient or authorized representative who has indicated his/her understanding and acceptance.     Dental advisory given  Plan Discussed with:  CRNA  Anesthesia Plan Comments:        Anesthesia Quick Evaluation

## 2022-06-09 NOTE — Telephone Encounter (Signed)
Pt last visit on 06/11/22.

## 2022-06-10 ENCOUNTER — Other Ambulatory Visit: Payer: Self-pay

## 2022-06-10 ENCOUNTER — Ambulatory Visit (HOSPITAL_BASED_OUTPATIENT_CLINIC_OR_DEPARTMENT_OTHER): Payer: Managed Care, Other (non HMO) | Admitting: Anesthesiology

## 2022-06-10 ENCOUNTER — Ambulatory Visit (HOSPITAL_BASED_OUTPATIENT_CLINIC_OR_DEPARTMENT_OTHER)
Admission: RE | Admit: 2022-06-10 | Discharge: 2022-06-10 | Disposition: A | Discharge status: Home / Self Care | Payer: Managed Care, Other (non HMO) | Attending: Obstetrics and Gynecology | Admitting: Obstetrics and Gynecology

## 2022-06-10 ENCOUNTER — Encounter (HOSPITAL_BASED_OUTPATIENT_CLINIC_OR_DEPARTMENT_OTHER): Payer: Self-pay | Admitting: Obstetrics and Gynecology

## 2022-06-10 ENCOUNTER — Encounter (HOSPITAL_BASED_OUTPATIENT_CLINIC_OR_DEPARTMENT_OTHER)
Admission: RE | Disposition: A | Discharge status: Home / Self Care | Payer: Self-pay | Source: Home / Self Care | Attending: Obstetrics and Gynecology

## 2022-06-10 DIAGNOSIS — O24439 Gestational diabetes mellitus in the puerperium, unspecified control: Secondary | ICD-10-CM | POA: Diagnosis not present

## 2022-06-10 DIAGNOSIS — O99345 Other mental disorders complicating the puerperium: Secondary | ICD-10-CM | POA: Diagnosis not present

## 2022-06-10 DIAGNOSIS — O165 Unspecified maternal hypertension, complicating the puerperium: Secondary | ICD-10-CM | POA: Insufficient documentation

## 2022-06-10 DIAGNOSIS — N8000 Endometriosis of the uterus, unspecified: Secondary | ICD-10-CM | POA: Diagnosis not present

## 2022-06-10 DIAGNOSIS — O9903 Anemia complicating the puerperium: Secondary | ICD-10-CM | POA: Diagnosis not present

## 2022-06-10 DIAGNOSIS — F53 Postpartum depression: Secondary | ICD-10-CM | POA: Insufficient documentation

## 2022-06-10 DIAGNOSIS — N711 Chronic inflammatory disease of uterus: Secondary | ICD-10-CM | POA: Insufficient documentation

## 2022-06-10 DIAGNOSIS — Z01818 Encounter for other preprocedural examination: Secondary | ICD-10-CM

## 2022-06-10 DIAGNOSIS — D649 Anemia, unspecified: Secondary | ICD-10-CM

## 2022-06-10 DIAGNOSIS — Z79899 Other long term (current) drug therapy: Secondary | ICD-10-CM | POA: Insufficient documentation

## 2022-06-10 HISTORY — PX: DILATION AND EVACUATION: SHX1459

## 2022-06-10 HISTORY — DX: Gestational diabetes mellitus in pregnancy, unspecified control: O24.419

## 2022-06-10 HISTORY — DX: Prediabetes: R73.03

## 2022-06-10 LAB — POCT I-STAT, CHEM 8
BUN: 15 mg/dL (ref 6–20)
Calcium, Ion: 1.18 mmol/L (ref 1.15–1.40)
Chloride: 102 mmol/L (ref 98–111)
Creatinine, Ser: 0.7 mg/dL (ref 0.44–1.00)
Glucose, Bld: 113 mg/dL — ABNORMAL HIGH (ref 70–99)
HCT: 36 % (ref 36.0–46.0)
Hemoglobin: 12.2 g/dL (ref 12.0–15.0)
Potassium: 3.7 mmol/L (ref 3.5–5.1)
Sodium: 138 mmol/L (ref 135–145)
TCO2: 26 mmol/L (ref 22–32)

## 2022-06-10 SURGERY — DILATION AND EVACUATION, UTERUS
Anesthesia: General | Site: Vagina

## 2022-06-10 MED ORDER — OXYCODONE HCL 5 MG PO TABS: 5.0000 mg | ORAL_TABLET | Freq: Once | ORAL | Status: DC | PRN

## 2022-06-10 MED ORDER — CARBOPROST TROMETHAMINE 250 MCG/ML IM SOLN
INTRAMUSCULAR | Status: AC
  Filled 2022-06-10: qty 1

## 2022-06-10 MED ORDER — DOXYCYCLINE HYCLATE 100 MG IV SOLR
200.0000 mg | INTRAVENOUS | Status: AC
  Administered 2022-06-10: 200 mg via INTRAVENOUS
  Filled 2022-06-10: qty 200

## 2022-06-10 MED ORDER — CHLOROPROCAINE HCL 1 % IJ SOLN
INTRAMUSCULAR | Status: DC | PRN
  Administered 2022-06-10: 10 mL

## 2022-06-10 MED ORDER — MIDAZOLAM HCL 2 MG/2ML IJ SOLN
INTRAMUSCULAR | Status: AC
  Filled 2022-06-10: qty 2

## 2022-06-10 MED ORDER — IBUPROFEN 600 MG PO TABS: 600.0000 mg | ORAL_TABLET | Freq: Four times a day (QID) | ORAL | 3 refills | Status: DC | PRN

## 2022-06-10 MED ORDER — ONDANSETRON HCL 4 MG/2ML IJ SOLN: 4.0000 mg | Freq: Once | INTRAMUSCULAR | Status: DC | PRN

## 2022-06-10 MED ORDER — DEXAMETHASONE SODIUM PHOSPHATE 10 MG/ML IJ SOLN
INTRAMUSCULAR | Status: DC | PRN
  Administered 2022-06-10: 5 mg via INTRAVENOUS

## 2022-06-10 MED ORDER — ACETAMINOPHEN 500 MG PO TABS
1000.0000 mg | ORAL_TABLET | Freq: Once | ORAL | Status: AC
  Administered 2022-06-10: 1000 mg via ORAL

## 2022-06-10 MED ORDER — HYDRALAZINE HCL 20 MG/ML IJ SOLN
INTRAMUSCULAR | Status: AC
  Filled 2022-06-10: qty 1

## 2022-06-10 MED ORDER — HYDRALAZINE HCL 20 MG/ML IJ SOLN
5.0000 mg | INTRAMUSCULAR | Status: DC | PRN
  Administered 2022-06-10: 5 mg via INTRAVENOUS

## 2022-06-10 MED ORDER — DOXYCYCLINE HYCLATE 100 MG IV SOLR: 200.0000 mg | INTRAVENOUS | Status: DC

## 2022-06-10 MED ORDER — PROPOFOL 10 MG/ML IV BOLUS
INTRAVENOUS | Status: DC | PRN
  Administered 2022-06-10: 200 mg via INTRAVENOUS

## 2022-06-10 MED ORDER — FENTANYL CITRATE (PF) 100 MCG/2ML IJ SOLN
INTRAMUSCULAR | Status: DC | PRN
  Administered 2022-06-10 (×2): 50 ug via INTRAVENOUS

## 2022-06-10 MED ORDER — METHYLERGONOVINE MALEATE 0.2 MG/ML IJ SOLN
INTRAMUSCULAR | Status: AC
  Filled 2022-06-10: qty 1

## 2022-06-10 MED ORDER — ONDANSETRON HCL 4 MG/2ML IJ SOLN
INTRAMUSCULAR | Status: DC | PRN
  Administered 2022-06-10: 4 mg via INTRAVENOUS

## 2022-06-10 MED ORDER — FENTANYL CITRATE (PF) 100 MCG/2ML IJ SOLN
INTRAMUSCULAR | Status: AC
  Filled 2022-06-10: qty 2

## 2022-06-10 MED ORDER — KETOROLAC TROMETHAMINE 30 MG/ML IJ SOLN
INTRAMUSCULAR | Status: DC | PRN
  Administered 2022-06-10: 30 mg via INTRAVENOUS

## 2022-06-10 MED ORDER — OXYCODONE HCL 5 MG/5ML PO SOLN: 5.0000 mg | Freq: Once | ORAL | Status: DC | PRN

## 2022-06-10 MED ORDER — LACTATED RINGERS IV SOLN: INTRAVENOUS | Status: DC

## 2022-06-10 MED ORDER — OXYTOCIN 10 UNIT/ML IJ SOLN
INTRAMUSCULAR | Status: AC
  Filled 2022-06-10: qty 1

## 2022-06-10 MED ORDER — ACETAMINOPHEN 500 MG PO TABS
ORAL_TABLET | ORAL | Status: AC
  Filled 2022-06-10: qty 2

## 2022-06-10 MED ORDER — POVIDONE-IODINE 10 % EX SWAB: 2.0000 | Freq: Once | CUTANEOUS | Status: DC

## 2022-06-10 MED ORDER — PROPOFOL 10 MG/ML IV BOLUS
INTRAVENOUS | Status: AC
  Filled 2022-06-10: qty 20

## 2022-06-10 MED ORDER — AMISULPRIDE (ANTIEMETIC) 5 MG/2ML IV SOLN: 10.0000 mg | Freq: Once | INTRAVENOUS | Status: DC | PRN

## 2022-06-10 MED ORDER — KETOROLAC TROMETHAMINE 30 MG/ML IJ SOLN: 30.0000 mg | Freq: Once | INTRAMUSCULAR | Status: DC | PRN

## 2022-06-10 MED ORDER — MIDAZOLAM HCL 2 MG/2ML IJ SOLN
INTRAMUSCULAR | Status: DC | PRN
  Administered 2022-06-10: 2 mg via INTRAVENOUS

## 2022-06-10 MED ORDER — TRANEXAMIC ACID-NACL 1000-0.7 MG/100ML-% IV SOLN
INTRAVENOUS | Status: AC
  Filled 2022-06-10: qty 100

## 2022-06-10 MED ORDER — CHLOROPROCAINE HCL 1 % IJ SOLN
INTRAMUSCULAR | Status: AC
  Filled 2022-06-10: qty 30

## 2022-06-10 MED ORDER — HYDROMORPHONE HCL 1 MG/ML IJ SOLN
INTRAMUSCULAR | Status: AC
  Filled 2022-06-10: qty 1

## 2022-06-10 MED ORDER — 0.9 % SODIUM CHLORIDE (POUR BTL) OPTIME
TOPICAL | Status: DC | PRN
  Administered 2022-06-10: 500 mL

## 2022-06-10 MED ORDER — LIDOCAINE HCL (PF) 2 % IJ SOLN
INTRAMUSCULAR | Status: AC
  Filled 2022-06-10: qty 5

## 2022-06-10 MED ORDER — HYDROMORPHONE HCL 1 MG/ML IJ SOLN
0.2500 mg | INTRAMUSCULAR | Status: DC | PRN
  Administered 2022-06-10: 0.25 mg via INTRAVENOUS

## 2022-06-10 MED ORDER — MISOPROSTOL 200 MCG PO TABS
ORAL_TABLET | ORAL | Status: AC
  Filled 2022-06-10: qty 5

## 2022-06-10 MED ORDER — LIDOCAINE 2% (20 MG/ML) 5 ML SYRINGE
INTRAMUSCULAR | Status: DC | PRN
  Administered 2022-06-10: 60 mg via INTRAVENOUS

## 2022-06-10 MED ORDER — MEPERIDINE HCL 25 MG/ML IJ SOLN: 6.2500 mg | INTRAMUSCULAR | Status: DC | PRN

## 2022-06-10 SURGICAL SUPPLY — 18 items
GLOVE BIOGEL PI IND STRL 6.5 (GLOVE) ×2 IMPLANT
GLOVE BIOGEL PI IND STRL 7.0 (GLOVE) ×2 IMPLANT
GLOVE BIOGEL PI INDICATOR 6.5 (GLOVE) ×2
GLOVE BIOGEL PI INDICATOR 7.0 (GLOVE) ×4
GLOVE SURG POLYISO LF SZ6 (GLOVE) ×1 IMPLANT
GOWN STRL REUS W/TWL LRG LVL3 (GOWN DISPOSABLE) ×3 IMPLANT
GOWN STRL REUS W/TWL XL LVL3 (GOWN DISPOSABLE) ×2 IMPLANT
KIT BERKELEY 1ST TRI 3/8 NO TR (MISCELLANEOUS) ×3 IMPLANT
KIT BERKELEY 1ST TRIMESTER 3/8 (MISCELLANEOUS) ×3 IMPLANT
KIT TURNOVER CYSTO (KITS) ×3 IMPLANT
NS IRRIG 500ML POUR BTL (IV SOLUTION) ×1 IMPLANT
PACK VAGINAL MINOR WOMEN LF (CUSTOM PROCEDURE TRAY) ×3 IMPLANT
PAD OB MATERNITY 4.3X12.25 (PERSONAL CARE ITEMS) ×3 IMPLANT
PAD PREP 24X48 CUFFED NSTRL (MISCELLANEOUS) ×3 IMPLANT
SET BERKELEY SUCTION TUBING (SUCTIONS) ×3 IMPLANT
SPIKE FLUID TRANSFER (MISCELLANEOUS) ×1 IMPLANT
TOWEL OR 17X26 10 PK STRL BLUE (TOWEL DISPOSABLE) ×3 IMPLANT
VACURETTE 9 RIGID CVD (CANNULA) ×1 IMPLANT

## 2022-06-10 NOTE — Anesthesia Procedure Notes (Signed)
Procedure Name: LMA Insertion Date/Time: 06/10/2022 8:42 AM  Performed by: Francie Massing, CRNAPre-anesthesia Checklist: Patient identified, Emergency Drugs available, Suction available and Patient being monitored Patient Re-evaluated:Patient Re-evaluated prior to induction Oxygen Delivery Method: Circle system utilized Preoxygenation: Pre-oxygenation with 100% oxygen Induction Type: IV induction Ventilation: Mask ventilation without difficulty LMA: LMA inserted LMA Size: 4.0 Number of attempts: 1 Airway Equipment and Method: Bite block Placement Confirmation: positive ETCO2 Tube secured with: Tape Dental Injury: Teeth and Oropharynx as per pre-operative assessment

## 2022-06-10 NOTE — H&P (Signed)
Kim Miles is an 31 y.o. female P1 here for scheduled dilation and evacuation. Patient had a cesarean section on 05/18/22 and reports ongoing vaginal bleeding with passage of clots. She was seen on 06/08/22 and ultrasound findings are concerning for retained products of conception. Patient otherwise reports feeling well. She denies chest pain, shortness of breath, lightheadedness or dizziness.       Past Medical History:  Diagnosis Date   Depression    Gestational diabetes    Hepatitis C antibody test positive-RNA negative  01/06/2022   Hypertension    chronic ,  prior to pregnancy   Menstrual migraine without status migrainosus, not intractable 01/01/2020   well controlled with junel-fe - pt skipping placebo and withdrawal bleed - reviewed OCP, monitoring, SE/risk.   Pre-diabetes    Retained products of conception, postpartum    Severe preeclampsia, third trimester 04/2022    Past Surgical History:  Procedure Laterality Date   CESAREAN SECTION N/A 05/18/2022   Procedure: CESAREAN SECTION;  Surgeon: Venora Maples, MD;  Location: [facility] LD ORS;  Service: Obstetrics;  Laterality: N/A;    Family History  Problem Relation Age of Onset   Asthma Mother    Hypertension Father    Anemia Sister    Eating disorder Sister    Cancer Maternal Grandmother    Cancer Maternal Grandfather    Diabetes Maternal Grandfather    Alzheimer's disease Maternal Grandfather    Heart attack Paternal Grandmother    Arthritis Paternal Grandmother     Social History:  reports that she has never smoked. She has never used smokeless tobacco. She reports that she does not drink alcohol and does not use drugs.  Allergies: No Known Allergies  Medications Prior to Admission  Medication Sig Dispense Refill Last Dose   escitalopram (LEXAPRO) 10 MG tablet Take 1 tablet (10 mg total) by mouth at bedtime. 90 tablet 3 06/09/2022 at 2200   ibuprofen (ADVIL) 600 MG tablet Take 1 tablet (600 mg total) by mouth  every 6 (six) hours as needed. 30 tablet 0 Past Week   NIFEdipine (ADALAT CC) 30 MG 24 hr tablet Take 1 tablet (30 mg total) by mouth 2 (two) times daily. (Patient taking differently: Take 30 mg by mouth at bedtime.) 84 tablet 0 06/09/2022 at 2200   Prenatal MV & Min w/FA-DHA (PRENATAL GUMMIES PO) Take 2 tablets by mouth daily.   06/09/2022   calcium carbonate (TUMS - DOSED IN MG ELEMENTAL CALCIUM) 500 MG chewable tablet Chew 500 mg by mouth daily as needed for indigestion or heartburn. (Patient not taking: Reported on 06/08/2022)   Unknown   HYDROcodone-acetaminophen (NORCO/VICODIN) 5-325 MG tablet Take 1-2 tablets by mouth every 4 (four) hours as needed for moderate pain. 30 tablet 0 Unknown   polyethylene glycol powder (GLYCOLAX/MIRALAX) 17 GM/SCOOP powder Take 17 g by mouth daily. (Patient not taking: Reported on 06/08/2022) 238 g 1 Unknown   simethicone (MYLICON) 80 MG chewable tablet Chew 1 tablet (80 mg total) by mouth 4 (four) times daily as needed for flatulence. (Patient not taking: Reported on 06/08/2022) 30 tablet 0 Unknown    Review of Systems See pertinent in HPI. All other systems reviewed and non contributory Blood pressure (!) 130/104, pulse 97, temperature 98.1 F (36.7 C), temperature source Oral, resp. rate 17, height 4\' 11"  (1.499 m), weight 86.3 kg, SpO2 99 %, currently breastfeeding. Physical Exam GENERAL: Well-developed, well-nourished female in no acute distress.  LUNGS: Clear to auscultation bilaterally.  HEART: Regular  rate and rhythm. ABDOMEN: Soft, nontender, nondistended. No organomegaly. PELVIC: Deferred to OR EXTREMITIES: No cyanosis, clubbing, or edema, 2+ distal pulses.  Results for orders placed or performed during the hospital encounter of 06/10/22 (from the past 24 hour(s))  I-STAT, chem 8     Status: Abnormal   Collection Time: 06/10/22  7:47 AM  Result Value Ref Range   Sodium 138 135 - 145 mmol/L   Potassium 3.7 3.5 - 5.1 mmol/L   Chloride 102 98 - 111  mmol/L   BUN 15 6 - 20 mg/dL   Creatinine, Ser 9.52 0.44 - 1.00 mg/dL   Glucose, Bld 841 (H) 70 - 99 mg/dL   Calcium, Ion 3.24 4.01 - 1.40 mmol/L   TCO2 26 22 - 32 mmol/L   Hemoglobin 12.2 12.0 - 15.0 g/dL   HCT 02.7 25.3 - 66.4 %    No results found. US Pelvis Limited  Result Date: 06/08/2022 CLINICAL DATA:  Postpartum hemorrhage, status post C-section on April 18, 2022 presenting with pelvic pain. Rule out retained products of conception. EXAM: TRANSABDOMINAL AND TRANSVAGINAL ULTRASOUND OF PELVIS TECHNIQUE: Both transabdominal and transvaginal ultrasound examinations of the pelvis were performed. Transabdominal technique was performed for global imaging of the pelvis including uterus, ovaries, adnexal regions, and pelvic cul-de-sac. It was necessary to proceed with endovaginal exam following the transabdominal exam to visualize the endometrium. COMPARISON:  May 18, 2022 FINDINGS: Uterus Measurements: 10.0 cm x 4.6 cm x 6.4 cm = volume: 157.3 mL. No fibroids or other mass visualized. Endometrium Thickness: 11.1 mm. Vascular areas are noted within the endometrium. Right ovary The right ovary is not visualized. Left ovary The left ovary is not visualized. Other findings No abnormal free fluid. A 0.9 cm x 0.9 cm x 2.2 cm well-defined, anechoic structure is seen near the cesarean section site. No abnormal flow is seen within this region on color Doppler evaluation. IMPRESSION: 1. Thickened endometrium which contains areas of vascularity consistent with retained products of conception. 2. Cystic structure near the cesarean section area which may represent a small hematoma. Electronically Signed   By: Aram Candela M.D.   On: 06/08/2022 04:26   Assessment/Plan: 31 yo with retained products of conception following cesarean section delivery on 7/24 here for D&E - Risks, benefits and alternatives were explained including but not limited to risks of bleeding, infection, uterine perforation and damage to  adjacent organs. - Patient verbalized understanding and all questions were answered  Ashok Sawaya 06/10/2022, 8:29 AM

## 2022-06-10 NOTE — Telephone Encounter (Signed)
Tried to call pt to get her scheduled. No answer

## 2022-06-10 NOTE — Anesthesia Postprocedure Evaluation (Signed)
Anesthesia Post Note  Patient: Kim Miles  Procedure(s) Performed: DILATATION AND EVACUATION (Vagina )     Patient location during evaluation: PACU Anesthesia Type: General Level of consciousness: awake and alert, oriented and patient cooperative Pain management: pain level controlled Vital Signs Assessment: post-procedure vital signs reviewed and stable Respiratory status: spontaneous breathing, nonlabored ventilation and respiratory function stable Cardiovascular status: blood pressure returned to baseline and stable Postop Assessment: no apparent nausea or vomiting Anesthetic complications: no   No notable events documented.  Last Vitals:  Vitals:   06/10/22 0945 06/10/22 0950  BP: 129/89   Pulse: 70 79  Resp: 14 16  Temp:    SpO2: 100% 100%    Last Pain:  Vitals:   06/10/22 0950  TempSrc:   PainSc: 4                  Lannie Fields

## 2022-06-10 NOTE — Discharge Instructions (Addendum)
No acetaminophen/Tylenol until after 12:00pm today if needed for pain.  No ibuprofen, Advil, Aleve, Motrin, ketorolac, meloxicam, naproxen, or other NSAIDS until after 3:00pm today if needed for pain.     **Patient should pump and dump for 24 hours per Pharmacy due to have dose of Doxycycline.** Ask NICU nurses or NICU Pharmacy about pumping after 24 hours.    DISCHARGE INSTRUCTIONS: HYSTEROSCOPY The following instructions have been prepared to help you care for yourself upon your return home.    Personal hygiene:  Use sanitary pads for vaginal drainage, not tampons.  Shower the day after your procedure.  NO tub baths, pools or Jacuzzis for 2-3 weeks.  Wipe front to back after using the bathroom.  Activity and limitations:  Do NOT drive or operate any equipment for 24 hours. The effects of anesthesia are still present and drowsiness may result.  Do NOT rest in bed all day.  Walking is encouraged.  Walk up and down stairs slowly.  You may resume your normal activity in one to two days or as indicated by your physician. Sexual activity: NO intercourse for at least 2 weeks after the procedure, or as indicated by your Doctor.  Diet: Eat a light meal as desired this evening. You may resume your usual diet tomorrow.  Return to Work: You may resume your work activities in one to two days or as indicated by Therapist, sports.  What to expect after your surgery: Expect to have vaginal bleeding/discharge for 2-3 days and spotting for up to 10 days. It is not unusual to have soreness for up to 1-2 weeks. You may have a slight burning sensation when you urinate for the first day. Mild cramps may continue for a couple of days. You may have a regular period in 2-6 weeks.  Call your doctor for any of the following:  Excessive vaginal bleeding or clotting, saturating and changing one pad every hour.  Inability to urinate 6 hours after discharge from hospital.  Pain not relieved by pain  medication.  Fever of 100.4 F or greater.  Unusual vaginal discharge or odor.         Post Anesthesia Home Care Instructions  Activity: Get plenty of rest for the remainder of the day. A responsible individual must stay with you for 24 hours following the procedure.  For the next 24 hours, DO NOT: -Drive a car -Advertising copywriter -Drink alcoholic beverages -Take any medication unless instructed by your physician -Make any legal decisions or sign important papers.  Meals: Start with liquid foods such as gelatin or soup. Progress to regular foods as tolerated. Avoid greasy, spicy, heavy foods. If nausea and/or vomiting occur, drink only clear liquids until the nausea and/or vomiting subsides. Call your physician if vomiting continues.  Special Instructions/Symptoms: Your throat may feel dry or sore from the anesthesia or the breathing tube placed in your throat during surgery. If this causes discomfort, gargle with warm salt water. The discomfort should disappear within 24 hours.

## 2022-06-10 NOTE — Op Note (Signed)
Barnett Applebaum PROCEDURE DATE: 06/10/2022  PREOPERATIVE DIAGNOSIS: Retained products of conception. POSTOPERATIVE DIAGNOSIS: The same. PROCEDURE:     Dilation and Evacuation. SURGEON:  Dr. Catalina Antigua  INDICATIONS: 31 y.o. G1P0101 with retained products of conception following cesarean delivery, needing surgical completion.  Risks of surgery were discussed with the patient including but not limited to: bleeding which may require transfusion; infection which may require antibiotics; injury to uterus or surrounding organs;need for additional procedures including laparotomy or laparoscopy; possibility of intrauterine scarring which may impair future fertility; and other postoperative/anesthesia complications. Written informed consent was obtained.    FINDINGS:  A 9-week size anteverted uterus, moderate amounts of products of conception, specimen sent to pathology.  ANESTHESIA:    Monitored intravenous sedation, paracervical block. INTRAVENOUS FLUIDS:  300 ml of LR ESTIMATED BLOOD LOSS:  Less than 25 ml. SPECIMENS:  Products of conception sent to pathology COMPLICATIONS:  None immediate.  PROCEDURE DETAILS:  The patient received intravenous antibiotics while in the preoperative area.  She was then taken to the operating room where general anesthesia was administered and was found to be adequate.  After an adequate timeout was performed, she was placed in the dorsal lithotomy position and examined; then prepped and draped in the sterile manner.   Her bladder was catheterized for an unmeasured amount of clear, yellow urine. A vaginal speculum was then placed in the patient's vagina and a single tooth tenaculum was applied to the anterior lip of the cervix.  A paracervical block using 0.5% Marcaine was administered. The cervix was gently dilated to accommodate a 9 mm suction curette that was gently advanced to the uterine fundus.  The suction device was then activated and curette slowly rotated to  clear the uterus of products of conception.  A sharp curettage was then performed to confirm complete emptying of the uterus. There was minimal bleeding noted and the tenaculum removed with good hemostasis noted.   All instruments were removed from the patient's vagina. The patient tolerated the procedure well and was taken to the recovery area awake, and in stable condition.  The patient will be discharged to home as per PACU criteria.  Routine postoperative instructions given.  She was prescribed Percocet, Ibuprofen and Colace.  She will follow up in the office in 2 weeks for postoperative evaluation.

## 2022-06-10 NOTE — Transfer of Care (Signed)
Immediate Anesthesia Transfer of Care Note  Patient: Kim Miles  Procedure(s) Performed: Procedure(s) (LRB): DILATATION AND EVACUATION (N/A)  Patient Location: PACU  Anesthesia Type: General  Level of Consciousness: awake, oriented, sedated and patient cooperative  Airway & Oxygen Therapy: Patient Spontanous Breathing and Patient connected to face mask oxygen  Post-op Assessment: Report given to PACU RN and Post -op Vital signs reviewed and stable  Post vital signs: Reviewed and stable  Complications: No apparent anesthesia complications  Last Vitals:  Vitals Value Taken Time  BP 148/105 06/10/22 0915  Temp 36.4 C 06/10/22 0912  Pulse 79 06/10/22 0920  Resp 13 06/10/22 0920  SpO2 100 % 06/10/22 0920  Vitals shown include unvalidated device data.  Last Pain:  Vitals:   06/10/22 0915  TempSrc:   PainSc: 0-No pain         Complications: No notable events documented.

## 2022-06-11 ENCOUNTER — Other Ambulatory Visit: Payer: Self-pay | Admitting: Family Medicine

## 2022-06-11 ENCOUNTER — Encounter (HOSPITAL_BASED_OUTPATIENT_CLINIC_OR_DEPARTMENT_OTHER): Payer: Self-pay | Admitting: Obstetrics and Gynecology

## 2022-06-11 LAB — SURGICAL PATHOLOGY

## 2022-06-12 ENCOUNTER — Encounter: Payer: Managed Care, Other (non HMO) | Admitting: Family Medicine

## 2022-06-12 ENCOUNTER — Ambulatory Visit: Payer: Self-pay

## 2022-06-12 NOTE — Telephone Encounter (Signed)
[facility] verified they didn't receive rx yesterday d/t power outage. Requesting to resend.  Requested Prescriptions  Pending Prescriptions Disp Refills  . escitalopram (LEXAPRO) 10 MG tablet [Pharmacy Med Name: ESCITALOPRAM 10 MG TABLET 10 Tablet] 90 tablet 0    Sig: TAKE 1 TABLET BY MOUTH AT BEDTIME     Psychiatry:  Antidepressants - SSRI Failed - 06/12/2022 10:32 AM      Failed - Valid encounter within last 6 months    Recent Outpatient Visits          1 year ago Prediabetes   [facility] Caro Laroche, DO   1 year ago Viral URI   [facility] Caro Laroche, DO   2 years ago Annual physical exam   [facility] Danelle Berry, PA-C   2 years ago Hypertension, unspecified type   [facility] Danelle Berry, PA-C   2 years ago Hypertension, unspecified type   [facility], [place], PA-C             Passed - Completed PHQ-2 or PHQ-9 in the last 360 days

## 2022-06-12 NOTE — Telephone Encounter (Signed)
Pt called and needs the current prescription for this at [facility] sent to [facility], please advise

## 2022-06-12 NOTE — Lactation Note (Signed)
This note was copied from a baby's chart.  NICU Lactation Consultation Note  Patient Name: Kim Miles [id number] Date: 06/12/2022 Age:33 wk.o.   Subjective Reason for consult: Follow-up assessment Ms. Mcnay continues to pump frequently. Dyad is practicing breastfeeding. I reviewed feeding norms at 36 weeks and scheduled a return visit to assist.   Objective Infant data: Mother's Current Feeding Choice: Breast Milk and Donor Milk  Infant feeding assessment Scale for Readiness: 2 Scale for Quality: 4    Maternal data: G1P0101  C-Section, Low Transverse  Pumping frequency: 6xd Pumped volume: 60 mL   Pump: Personal (DEBP from insurance)  Assessment Maternal: Milk volume: Normal   Intervention/Plan Interventions: Infant Driven Feeding Algorithm education; Education  Plan: Consult Status: NICU follow-up  NICU Follow-up type: Assist with IDF-2 (Mother does not need to pre-pump before breastfeeding)  LC visit scheduled for Saturday 8-19 @ 1200  Elder Negus 06/12/2022, 4:33 PM

## 2022-06-13 ENCOUNTER — Ambulatory Visit: Payer: Self-pay

## 2022-06-13 NOTE — Lactation Note (Signed)
This note was copied from a baby's chart. Lactation Consultation Note Infant was receiving gavage and did not wake during my visit. I placed him in biological position and provided lick and learn ed.   Patient Name: Kim Miles [id number] Date: 06/13/2022 Reason for consult: Breastfeeding assistance Age:33 wk.o.   Interventions Interventions: Skin to skin;Education;Infant Driven Feeding Algorithm education   Consult Status Consult Status: NICU follow-up Date: 06/13/22 Follow-up type: In-patient   Elder Negus 06/13/2022, 12:37 PM

## 2022-06-18 ENCOUNTER — Ambulatory Visit: Payer: Self-pay

## 2022-06-18 NOTE — Lactation Note (Signed)
This note was copied from a baby's chart.  NICU Lactation Consultation Note  Patient Name: Kim Miles [id number] Date: 06/18/2022 Age:33 wk.o.   Subjective Reason for consult: Follow-up assessment; Mother's request; Primapara; 1st time breastfeeding; NICU baby; Preterm <34wks  Lactation followed up to assist with a feeding. Baby had large stool prior to this attempt. When I placed him on the left breast he latched briefly with NNS. He appeared sleepy and released the breast. He then had a brief bradycardic event that was self limiting in nature.  We discussed breast pumping routines. I noted that baby's brief latch triggered a let down with copious milk in the NS.   I offered to return to assist again, and we made an appointment for Sunday at 0900.  Ms. Hyer's milk volume is slightly BNL. I recommended adding in a few mini pumping sessions after placing baby at the breast to help with stimulating her hormones.   Objective Infant data: Mother's Current Feeding Choice: Breast Milk and Donor Milk  Infant feeding assessment Scale for Readiness: 2    Maternal data: G1P0101  C-Section, Low Transverse  Current breast feeding challenges:: NICU  Does the patient have breastfeeding experience prior to this delivery?: No  Pumping frequency: q3 hours Pumped volume: 30 mL   Pump: Personal (DEBP from insurance)  Assessment Infant: LATCH Score: 9   Intervention/Plan Interventions: Breast feeding basics reviewed; Assisted with latch; Skin to skin; Adjust position; Education  Tools: Nipple Shields Pump Education: Setup, frequency, and cleaning Nipple shield size: 16  Plan: Consult Status: NICU follow-up  NICU Follow-up type: Assist with IDF-1 (Mother to pre-pump before breastfeeding)    Walker Shadow 06/18/2022, 3:34 PM

## 2022-06-19 ENCOUNTER — Ambulatory Visit: Payer: Self-pay

## 2022-06-19 NOTE — Lactation Note (Signed)
This note was copied from a baby's chart.  NICU Lactation Consultation Note  Patient Name: Kim Miles [id number] Date: 06/19/2022 Age:33 wk.o.   Subjective Reason for consult: Follow-up assessment; RN request; 1st time breastfeeding; Primapara  Lactation called to room to bring an additional nipple shield. I was able to visualize Kim Miles latched to the breast on the left side. She was using a size 16 but experiencing pain. When we removed it, I noted blanching on the nipple. I re-sized to a size 20, and Kim Miles notices improvement.  SLP, Brunilda Payor, entered while I was in the room. Parent is interested in practicing bottle feeding so she can have multiple feeding options. She would like to work on breastfeeding over the weekend first, and SLP made appointment for Monday.  Parent states that she is continuing to pump consistently. SLP and lactation recommended that parent discontinue pre-pumping the breasts with feedings.  Objective Infant data: Mother's Current Feeding Choice: Breast Milk and Donor Milk  Infant feeding assessment Scale for Readiness: 3   Maternal data: G1P0101  C-Section, Low Transverse  Current breast feeding challenges:: NICU   Does the patient have breastfeeding experience prior to this delivery?: No  Pumping frequency: q3 hours Pumped volume: 30 mL   Pump: Personal  Assessment Infant: LATCH Score: 9  Feeding Status: IDF-2   Intervention/Plan Interventions: Breast feeding basics reviewed; Assisted with latch; Education  Tools: Nipple Shields Pump Education: Setup, frequency, and cleaning Nipple shield size: 20; Other (comment) (was at a 16 and upgraded to a 20 due to nipple pain)  Plan: Consult Status: NICU follow-up  NICU Follow-up type: Assist with IDF-2 (Mother does not need to pre-pump before breastfeeding); Weekly NICU follow up    [name] 06/19/2022, 2:56 PM

## 2022-06-21 ENCOUNTER — Ambulatory Visit: Payer: Self-pay

## 2022-06-21 NOTE — Lactation Note (Signed)
This note was copied from a baby's chart.   NICU Lactation Consultation Note  Patient Name: Kim Miles MEQAS'T Date: 06/21/2022 Age:33 wk.o.   Subjective Reason for consult: Follow-up assessment; Mother's request; 1st time breastfeeding; NICU baby; Preterm <34wks  Lactation assisted with breastfeeding baby [name]. Baby was challenging to wake up. We changed his diaper and he began to cue. I placed him on the left breast, and Ms. Benkert prepared the NS. Baby latched and suckled for about 5 minutes. He then had a stool and fell asleep.  Ms. Simer appeared to have sufficient breast milk at this session, as breasts were full and pre-filling the nipple shield. We heard audible spontaneous swallows and noted ample milk in the NS when he released.  Milk supply varies throughout the day. Ms. Boehm tends to have more volume in the am and pumps about 1 oz (combined) in later sessions. Baby will work with SLP tomorrow to begin bottle feeding attempt. I praised Ms. Braun for the progress that she and [name] are making. Lactation to work with SLP to help identify feeding methods that meet parental goals and ensure adequate daily growth.  Objective Infant data: Mother's Current Feeding Choice: Breast Milk and Donor Milk  Infant feeding assessment Scale for Readiness: 1 Scale for Quality: 2  Maternal data: G1P0101  C-Section, Low Transverse  Current breast feeding challenges:: NICU; feeding   Does the patient have breastfeeding experience prior to this delivery?: No  Pumping frequency: post pump with each feeding q3 hours   Pump: Personal  Assessment Infant: LATCH Score: 9  Feeding Status: IDF-2    Intervention/Plan Interventions: Breast feeding basics reviewed; Education  Tools: Nipple Shields  Plan: Consult Status: NICU follow-up  NICU Follow-up type: Assist with IDF-2 (Mother does not need to pre-pump before breastfeeding)    Walker Shadow 06/21/2022, 9:31 AMHeather  Larita Fife 06/21/2022, 9:31 AMLactation Consultation Note  Patient Name: Kim Miles [id number] Date: 06/21/2022 Reason for consult: Follow-up assessment;Mother's request;1st time breastfeeding;NICU baby;Preterm <34wks Age:33 wk.o.  Maternal Data Does the patient have breastfeeding experience prior to this delivery?: No  Feeding Mother's Current Feeding Choice: Breast Milk and Donor Milk  LATCH Score Latch: Repeated attempts needed to sustain latch, nipple held in mouth throughout feeding, stimulation needed to elicit sucking reflex.  Audible Swallowing: Spontaneous and intermittent  Type of Nipple: Everted at rest and after stimulation  Comfort (Breast/Nipple): Soft / non-tender  Hold (Positioning): No assistance needed to correctly position infant at breast.  LATCH Score: 9   Lactation Tools Discussed/Used Tools: Nipple Shields Breast pump type: Double-Electric Breast Pump Pumping frequency: post pump with each feeding q3 hours  Interventions Interventions: Breast feeding basics reviewed;Education  Discharge Discharge Education: Outpatient recommendation  Consult Status Consult Status: NICU follow-up Date: 06/21/22 Follow-up type: In-patient    Walker Shadow 06/21/2022, 9:31 AM

## 2022-06-25 ENCOUNTER — Ambulatory Visit: Payer: Self-pay

## 2022-06-25 NOTE — Lactation Note (Addendum)
This note was copied from a baby's chart.  NICU Lactation Consultation Note  Patient Name: Boy Jaylen Claude [id number]'P Date: 06/25/2022 Age:33 wk.o.   Subjective Reason for consult: Follow-up assessment; NICU baby  Lactation followed up with birth parent, son [name] and support person. Baby just breast fed for approximately 20 minutes, per parent. [name] is doing a combination of breast and bottle feeding.  Parent states optimism for discharge soon. We reviewed discharge education including community resources. I encouraged the family to follow up with S. Hice in outpatient as well as the breastfeeding support group.  I placed a referral with S. Hice. Parents have community resources hand out.  Follow up planned with [facility] in [facility].  Objective Infant data: Mother's Current Feeding Choice: Breast Milk and Formula  Infant feeding assessment Scale for Readiness: 2 Scale for Quality: 5 (brady with feed)     Maternal data: G1P0101  C-Section, Low Transverse  Pump: Personal  Assessment Infant: LATCH Score: 10   Intervention/Plan Plan: Consult Status: NICU follow-up    Walker Shadow 06/25/2022, 4:55 PM

## 2022-06-29 ENCOUNTER — Ambulatory Visit: Payer: Self-pay

## 2022-06-29 NOTE — Lactation Note (Addendum)
This note was copied from a baby's chart.  NICU Lactation Consultation Note  Patient Name: Kim Miles [id number] Date: 06/29/2022 Age:33 wk.o.   Subjective Reason for consult: Follow-up assessment; Primapara; 1st time breastfeeding; NICU baby  Lactation briefly checked in with parents of baby [name]. Parents state that they understand baby's feeding plan and have all supplies needed at this time.   Objective  Infant feeding assessment Scale for Readiness: 1 Scale for Quality: 2  Maternal data: G1P0101  C-Section, Low Transverse   Pump: Personal  Intervention/Plan Interventions: Skin to skin; Hand express; Support pillows  Plan: Consult Status: NICU follow-up  NICU Follow-up type: Weekly NICU follow up    Walker Shadow 06/29/2022, 5:44 PM

## 2022-06-30 ENCOUNTER — Ambulatory Visit: Payer: Managed Care, Other (non HMO) | Admitting: Obstetrics & Gynecology

## 2022-07-01 ENCOUNTER — Other Ambulatory Visit: Payer: Self-pay | Admitting: *Deleted

## 2022-07-01 ENCOUNTER — Encounter: Payer: Self-pay | Admitting: Family Medicine

## 2022-07-01 MED ORDER — NORETHINDRONE 0.35 MG PO TABS: 1.0000 | ORAL_TABLET | Freq: Every day | ORAL | 11 refills | Status: DC

## 2022-07-02 ENCOUNTER — Other Ambulatory Visit: Payer: Managed Care, Other (non HMO)

## 2022-07-02 ENCOUNTER — Ambulatory Visit: Payer: Managed Care, Other (non HMO) | Admitting: Advanced Practice Midwife

## 2022-07-06 ENCOUNTER — Encounter: Payer: Self-pay | Admitting: Family Medicine

## 2022-07-06 ENCOUNTER — Other Ambulatory Visit: Payer: Managed Care, Other (non HMO)

## 2022-07-06 ENCOUNTER — Inpatient Hospital Stay (HOSPITAL_COMMUNITY): Admit: 2022-07-06 | Payer: Self-pay

## 2022-07-06 DIAGNOSIS — O24419 Gestational diabetes mellitus in pregnancy, unspecified control: Secondary | ICD-10-CM

## 2022-07-07 LAB — GLUCOSE TOLERANCE, 2 HOURS
Glucose, 2 hour: 166 mg/dL — ABNORMAL HIGH (ref 70–139)
Glucose, GTT - Fasting: 108 mg/dL — ABNORMAL HIGH (ref 70–99)

## 2022-07-22 ENCOUNTER — Telehealth: Payer: Self-pay | Admitting: General Practice

## 2022-07-22 NOTE — Telephone Encounter (Signed)
Juliann Pulse from [facility] called and left message on nurse voicemail line stating she went out to the patient's house today for a home visit. The patient delivered 7/24 and her baby recently came home from NICU 2-3 weeks ago. Her blood pressures today at the home visit were 136/94 and 136/90. The patient is taking nifedipine 30mg  daily.

## 2022-07-23 NOTE — Telephone Encounter (Signed)
Dr A made aware and no change in POC at this time

## 2022-07-28 ENCOUNTER — Other Ambulatory Visit: Payer: Managed Care, Other (non HMO)

## 2022-07-28 DIAGNOSIS — O24419 Gestational diabetes mellitus in pregnancy, unspecified control: Secondary | ICD-10-CM

## 2022-07-28 DIAGNOSIS — O0990 Supervision of high risk pregnancy, unspecified, unspecified trimester: Secondary | ICD-10-CM

## 2022-07-29 ENCOUNTER — Encounter: Payer: Self-pay | Admitting: Family Medicine

## 2022-07-29 ENCOUNTER — Ambulatory Visit (INDEPENDENT_AMBULATORY_CARE_PROVIDER_SITE_OTHER): Payer: Managed Care, Other (non HMO) | Admitting: Family Medicine

## 2022-07-29 LAB — GLUCOSE TOLERANCE, 2 HOURS
Glucose, 2 hour: 117 mg/dL (ref 70–139)
Glucose, GTT - Fasting: 103 mg/dL — ABNORMAL HIGH (ref 70–99)

## 2022-07-29 NOTE — Progress Notes (Signed)
[place] Partum Visit Note  Kim Miles is a 31 y.o. G22P0101 female who presents for a postpartum visit. She is 10 weeks postpartum following a primary cesarean section.  I have fully reviewed the prenatal and intrapartum course. The delivery was at 33 gestational weeks.  Anesthesia: spinal. Postpartum course has been unremarkable. Baby is doing well. Baby is feeding by bottle - Neosure Similac . Bleeding no bleeding. Bowel function is normal. Bladder function is normal. Patient is sexually active. Contraception method is oral progesterone-only contraceptive. Postpartum depression screening: negative.   The pregnancy intention screening data noted above was reviewed. Potential methods of contraception were discussed. The patient elected to proceed with No data recorded.   Edinburgh Postnatal Depression Scale - 07/29/22 1320       Edinburgh Postnatal Depression Scale:  In the Past 7 Days   I have been able to laugh and see the funny side of things. 0    I have looked forward with enjoyment to things. 0    I have blamed myself unnecessarily when things went wrong. 0    I have been anxious or worried for no good reason. 0    I have felt scared or panicky for no good reason. 0    Things have been getting on top of me. 0    I have been so unhappy that I have had difficulty sleeping. 0    I have felt sad or miserable. 0    I have been so unhappy that I have been crying. 0    The thought of harming myself has occurred to me. 0    Edinburgh Postnatal Depression Scale Total 0             Health Maintenance Due  Topic Date Due   FOOT EXAM  Never done   OPHTHALMOLOGY EXAM  Never done   COVID-19 Vaccine (6 - Pfizer series) 01/13/2021   INFLUENZA VACCINE  05/26/2022    The following portions of the patient's history were reviewed and updated as appropriate: allergies, current medications, past family history, past medical history, past social history, past surgical history, and  problem list.  Review of Systems Pertinent items noted in HPI and remainder of comprehensive ROS otherwise negative.  Objective:  BP (!) 141/86   Pulse 88   Wt 191 lb (86.6 kg)   Breastfeeding No   BMI 38.58 kg/m    General:  alert, cooperative, appears stated age, and mildly obese  Lungs: Normal effort  Heart:  regular rate and rhythm  Abdomen: soft, non-tender; bowel sounds normal; no masses,  no organomegaly   Wound well approximated incision       Assessment:    Normal postpartum exam.   Plan:   Essential components of care per ACOG recommendations:  1.  Mood and well being: Patient with negative depression screening today. Reviewed local resources for support.  - Patient tobacco use? No.   - hx of drug use? No.    2. Infant care and feeding:  -Patient currently breastmilk feeding? No.  -Social determinants of health (SDOH) reviewed in EPIC. No concerns  3. Sexuality, contraception and birth spacing - Patient does not want a pregnancy in the next year.  Desired family size is 1 children.  - Reviewed reproductive life planning. Reviewed contraceptive methods based on pt preferences and effectiveness.  Patient desired Oral Contraceptive today.   - Discussed birth spacing of 18 months  4. Sleep and fatigue -Encouraged family/partner/community support  of 4 hrs of uninterrupted sleep to help with mood and fatigue  5. Physical Recovery  - Discussed patients delivery and complications. She describes her labor as good. - Patient had a C-section emergent.  Perineal healing reviewed. Patient expressed understanding - Patient has urinary incontinence? No. - Patient is safe to resume physical and sexual activity  6.  Health Maintenance - HM due items addressed Yes - Last pap smear 02/2021 in Care Everywhere Pap smear not done at today's visit.  -Breast Cancer screening indicated? No.   7. Chronic Disease/Pregnancy Condition follow up: Gestational Diabetes  2 hour  yesterday which was Normal--no IFG as not > 110.  - PCP follow up  Reva Bores, MD [facility] for [facility]

## 2022-10-08 ENCOUNTER — Other Ambulatory Visit: Payer: Self-pay | Admitting: Obstetrics and Gynecology

## 2023-01-24 IMAGING — US US OB LIMITED
1 series · 8 of 8 positions shown · non-contrast
Comparison: none

[Series 1: us ob limited · 8 acquisitions, 8 frames shown]
[im 1/8]
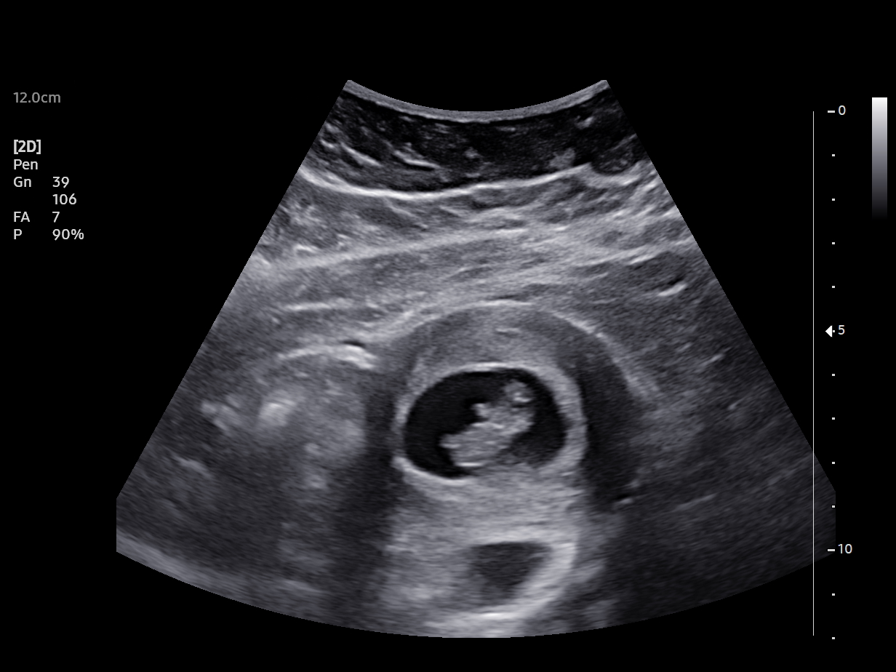
[im 2/8]
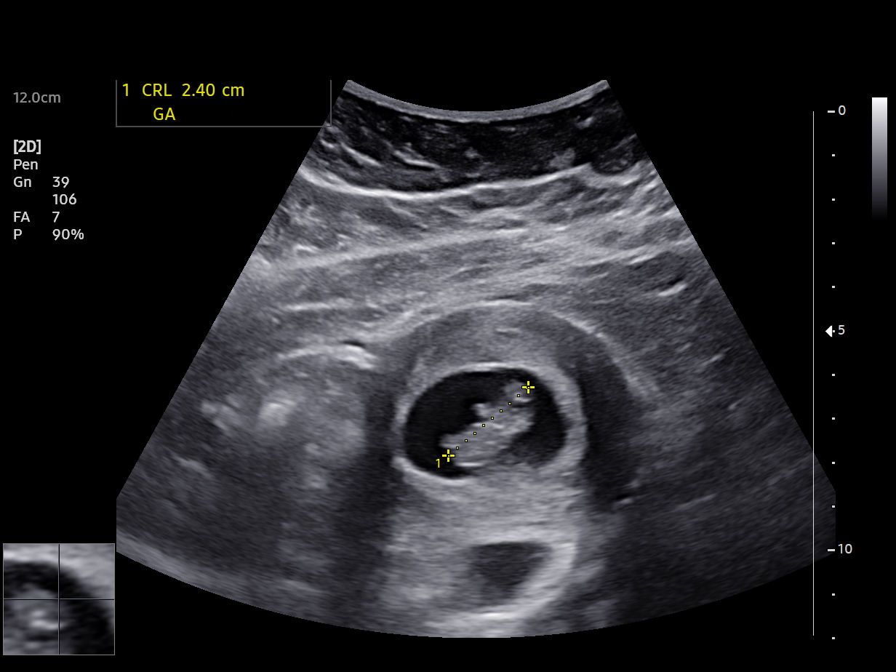
[im 3/8]
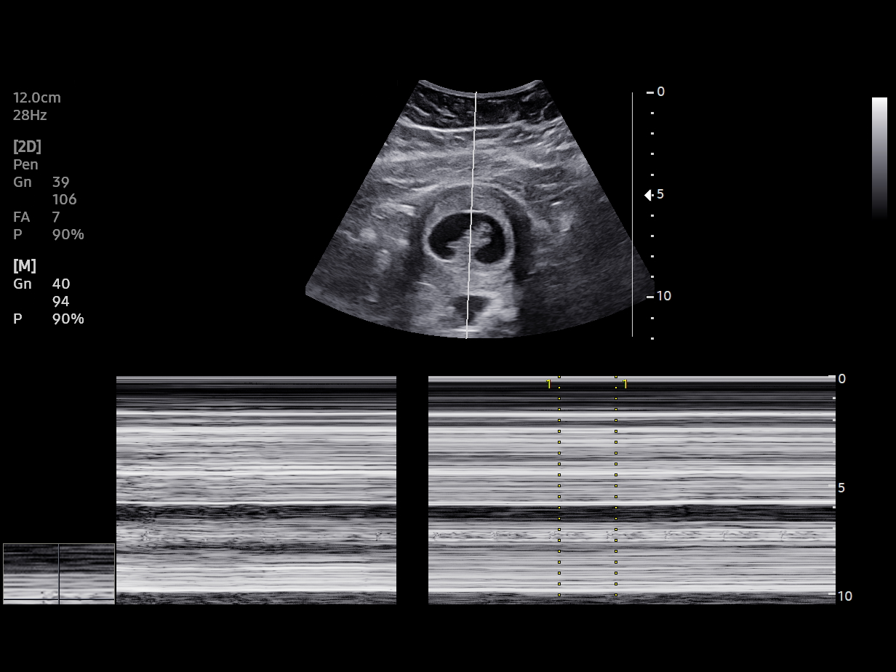
[im 4/8]
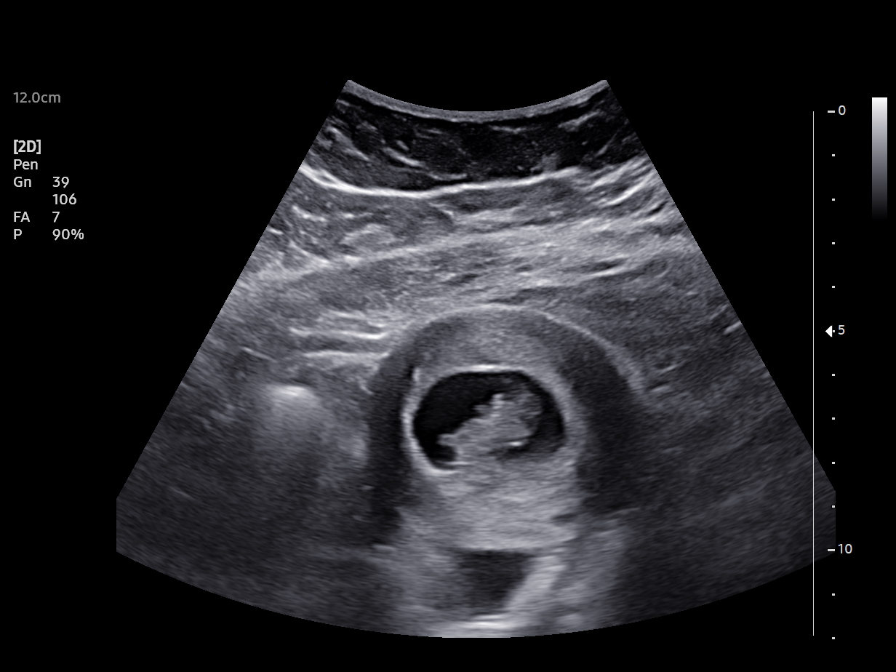
[im 5/8]
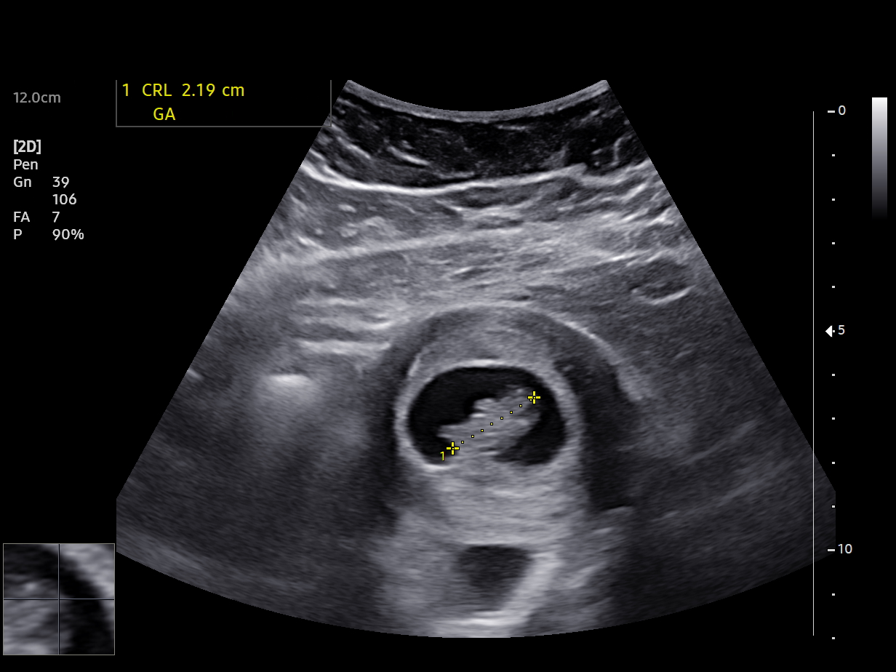
[im 6/8]
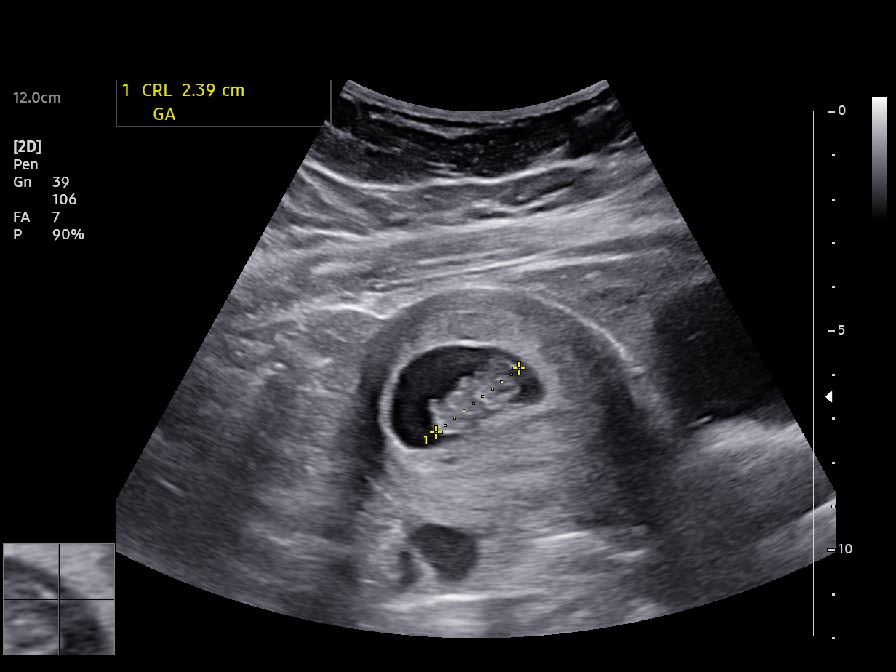
[im 7/8]
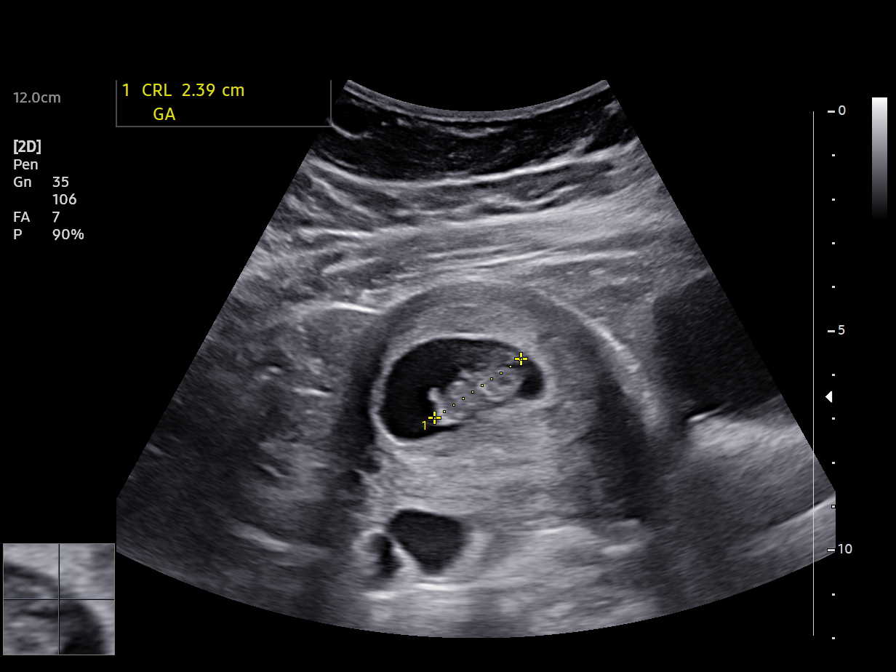
[im 8/8]
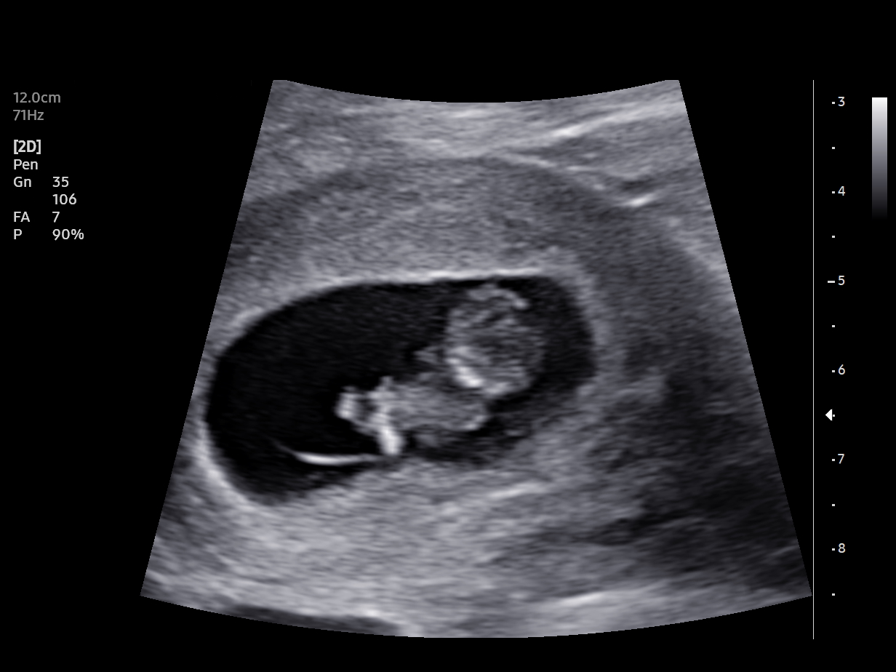

[8 of 8 positions shown; findings below may reference images not displayed]

Attending:        Dionisio Dyer         Ref. Address:      [REDACTED]
                                                             [facility]
                                                             [place]

 1  [HOSPITAL]                         [address].0     [place]

Indications

 9 weeks gestation of pregnancy
Fetal Evaluation

 Num Of Fetuses:          1
 Fetal Heart Rate(bpm):   181
 Cardiac Activity:        Observed
Biometry

 CRL:      23.9  mm     G. Age:  9w 0d                   EDD:    07/10/22
OB History

 Gravidity:    1         Term:   0        Prem:   0        SAB:   0
 TOP:          0       Ectopic:  0        Living: 0
Gestational Age

 LMP:           9w 4d         Date:  09/29/21                 EDD:   07/06/22
 Best:          9w 4d      Det. By:  LMP  (09/29/21)          EDD:   07/06/22
Impression

 Single live intrauterine pregnancy.  Concordant
 measurements/assigned GA by LMP.
Recommendations

 Start prenatal care as recommended.
              K-MILLE YAGOUB

## 2023-02-03 ENCOUNTER — Ambulatory Visit: Payer: Managed Care, Other (non HMO) | Admitting: Nurse Practitioner

## 2023-02-03 ENCOUNTER — Encounter: Payer: Self-pay | Admitting: Nurse Practitioner

## 2023-02-03 VITALS — BP 139/101 | HR 91 | Ht 59.0 in | Wt 192.4 lb

## 2023-02-03 DIAGNOSIS — I10 Essential (primary) hypertension: Secondary | ICD-10-CM | POA: Diagnosis not present

## 2023-02-03 DIAGNOSIS — F330 Major depressive disorder, recurrent, mild: Secondary | ICD-10-CM | POA: Diagnosis not present

## 2023-02-03 DIAGNOSIS — Z6838 Body mass index (BMI) 38.0-38.9, adult: Secondary | ICD-10-CM | POA: Diagnosis not present

## 2023-02-03 DIAGNOSIS — Z7689 Persons encountering health services in other specified circumstances: Secondary | ICD-10-CM | POA: Diagnosis not present

## 2023-02-03 MED ORDER — LABETALOL HCL 100 MG PO TABS: 50.0000 mg | ORAL_TABLET | Freq: Two times a day (BID) | ORAL | 1 refills | Status: DC

## 2023-02-03 NOTE — Progress Notes (Signed)
New Patient Office Visit  Subjective    Patient ID: Kim Miles, female    DOB: 1991-06-28  Age: 32 y.o. MRN: [id number]  CC:  Chief Complaint  Patient presents with   New Patient (Initial Visit)    HPI Kim Miles presents to establish care Has been followed by OB until now  -has 88 month old son  -emergency c-section due to preeclampsia  --no longer on meds for HTN though blood pressure is elevated  -was hospitalized for 3 weeks prior to delivery. Was on labetalol while hospitalized. Discharged on nifedipine.  -started causing her dizziness.  -takes lexapro. Was on this prior to and throughout her pregnancy.  -She denies chest pain, chest pressure, or shortness of breath. She denies headaches or visual disturbances. she denies abdominal pain, nausea, vomiting, or changes in bowel or bladder habits.    Outpatient Encounter Medications as of 02/03/2023  Medication Sig   escitalopram (LEXAPRO) 10 MG tablet TAKE 1 TABLET BY MOUTH AT BEDTIME   norethindrone (MICRONOR) 0.35 MG tablet Take 1 tablet (0.35 mg total) by mouth daily.   [DISCONTINUED] labetalol (NORMODYNE) 100 MG tablet Take 0.5 tablets (50 mg total) by mouth 2 (two) times daily.   [DISCONTINUED] NIFEdipine (ADALAT CC) 30 MG 24 hr tablet Take 1 tablet (30 mg total) by mouth 2 (two) times daily. (Patient taking differently: Take 30 mg by mouth at bedtime.)   [DISCONTINUED] Prenatal MV & Min w/FA-DHA (PRENATAL GUMMIES PO) Take 2 tablets by mouth daily. (Patient not taking: Reported on 07/29/2022)   No facility-administered encounter medications on file as of 02/03/2023.    Past Medical History:  Diagnosis Date   Depression    Gestational diabetes    Hepatitis C antibody test positive-RNA negative  01/06/2022   Hypertension    chronic ,  prior to pregnancy   Menstrual migraine without status migrainosus, not intractable 01/01/2020   well controlled with junel-fe - pt skipping placebo and withdrawal bleed -  reviewed OCP, monitoring, SE/risk.   Pre-diabetes    Retained products of conception, postpartum    Severe preeclampsia, third trimester 04/2022    Past Surgical History:  Procedure Laterality Date   CESAREAN SECTION N/A 05/18/2022   Procedure: CESAREAN SECTION;  Surgeon: Venora Maples, MD;  Location: [facility];  Service: Obstetrics;  Laterality: N/A;   DILATION AND EVACUATION N/A 06/10/2022   Procedure: DILATATION AND EVACUATION;  Surgeon: Catalina Antigua, MD;  Location: [facility];  Service: Gynecology;  Laterality: N/A;    Family History  Problem Relation Age of Onset   Asthma Mother    Hypertension Father    Anemia Sister    Eating disorder Sister    Cancer Maternal Grandmother    Cancer Maternal Grandfather    Diabetes Maternal Grandfather    Alzheimer's disease Maternal Grandfather    Heart attack Paternal Grandmother    Arthritis Paternal Grandmother     Social History   Socioeconomic History   Marital status: Married    Spouse name: Georganne Slavey   Number of children: 0   Years of education: 12   Highest education level: Master's degree (e.g., MA, MS, MEng, MEd, MSW, MBA)  Occupational History   Not on file  Tobacco Use   Smoking status: Never   Smokeless tobacco: Never  Vaping Use   Vaping Use: Never used  Substance and Sexual Activity   Alcohol use: Never   Drug use: Never   Sexual activity: Not  Currently    Birth control/protection: None  Other Topics Concern   Not on file  Social History Narrative   Not on file   Social Determinants of Health   Financial Resource Strain: Low Risk  (08/15/2019)   Overall Financial Resource Strain (CARDIA)    Difficulty of Paying Living Expenses: Not hard at all  Food Insecurity: No Food Insecurity (01/19/2022)   Hunger Vital Sign    Worried About Running Out of Food in the Last Year: Never true    Ran Out of Food in the Last Year: Never true  Transportation Needs: No Transportation Needs  (08/15/2019)   PRAPARE - Administrator, Civil Service (Medical): No    Lack of Transportation (Non-Medical): No  Physical Activity: Inactive (08/15/2019)   Exercise Vital Sign    Days of Exercise per Week: 0 days    Minutes of Exercise per Session: 0 min  Stress: No Stress Concern Present (08/15/2019)   [employer] of Occupational Health - Occupational Stress Questionnaire    Feeling of Stress : Only a little  Social Connections: Moderately Isolated (08/15/2019)   Social Connection and Isolation Panel [NHANES]    Frequency of Communication with Friends and Family: More than three times a week    Frequency of Social Gatherings with Friends and Family: Once a week    Attends Religious Services: More than 4 times per year    Active Member of [employer] or Organizations: No    Attends Banker Meetings: Never    Marital Status: Never married  Intimate Partner Violence: Not At Risk (08/15/2019)   Humiliation, Afraid, Rape, and Kick questionnaire    Fear of Current or Ex-Partner: No    Emotionally Abused: No    Physically Abused: No    Sexually Abused: No    ROS See HPI       Objective    Today's Vitals   02/03/23 1049 02/03/23 1146  BP: (Abnormal) 138/94 (Abnormal) 139/101  Pulse: 91   SpO2: 98%   Weight: 192 lb 6.4 oz (87.3 kg)   Height: 4\' 11"  (1.499 m)    Body mass index is 38.86 kg/m.   Physical Exam Vitals and nursing note reviewed.  Constitutional:      Appearance: Normal appearance. She is well-developed. She is obese.  HENT:     Head: Normocephalic and atraumatic.     Nose: Nose normal.     Mouth/Throat:     Mouth: Mucous membranes are moist.     Pharynx: Oropharynx is clear.  Eyes:     Extraocular Movements: Extraocular movements intact.     Conjunctiva/sclera: Conjunctivae normal.     Pupils: Pupils are equal, round, and reactive to light.  Neck:     Vascular: No carotid bruit.  Cardiovascular:     Rate and Rhythm: Normal  rate and regular rhythm.     Pulses: Normal pulses.     Heart sounds: Normal heart sounds.  Pulmonary:     Effort: Pulmonary effort is normal.     Breath sounds: Normal breath sounds.  Abdominal:     Palpations: Abdomen is soft.  Musculoskeletal:        General: Normal range of motion.     Cervical back: Normal range of motion and neck supple.  Lymphadenopathy:     Cervical: No cervical adenopathy.  Skin:    General: Skin is warm and dry.     Capillary Refill: Capillary refill takes less than 2 seconds.  Neurological:     General: No focal deficit present.     Mental Status: She is alert and oriented to person, place, and time.  Psychiatric:        Mood and Affect: Mood normal.        Behavior: Behavior normal.        Thought Content: Thought content normal.        Judgment: Judgment normal.        Assessment & Plan:  Essential hypertension Assessment & Plan: Restart labetalol at 100 mg twice daily.  -limit salt in diet and increase water.  -recheck in 4 weeks.    Mild episode of recurrent major depressive disorder (HCC) Assessment & Plan: Stable. Continue lexapro as prescribed    BMI 38.0-38.9,adult Assessment & Plan: Discussed lowering calorie intake to 1500 calories per day and incorporating exercise into daily routine to help lose weight.    Encounter to establish care    Return in about 4 weeks (around 03/03/2023) for blood pressure, FBW a week prior to visit.   Carlean Jews, NP

## 2023-02-08 ENCOUNTER — Encounter: Payer: Self-pay | Admitting: Nurse Practitioner

## 2023-02-08 ENCOUNTER — Other Ambulatory Visit: Payer: Self-pay | Admitting: Nurse Practitioner

## 2023-02-08 DIAGNOSIS — I10 Essential (primary) hypertension: Secondary | ICD-10-CM

## 2023-02-08 MED ORDER — LABETALOL HCL 100 MG PO TABS: 100.0000 mg | ORAL_TABLET | Freq: Two times a day (BID) | ORAL | 1 refills | Status: DC

## 2023-02-18 ENCOUNTER — Encounter: Payer: Self-pay | Admitting: Obstetrics & Gynecology

## 2023-02-18 ENCOUNTER — Ambulatory Visit: Payer: Managed Care, Other (non HMO) | Admitting: Obstetrics & Gynecology

## 2023-02-18 DIAGNOSIS — L700 Acne vulgaris: Secondary | ICD-10-CM | POA: Diagnosis not present

## 2023-02-18 NOTE — Progress Notes (Signed)
   GYNECOLOGY OFFICE VISIT NOTE  History:   Kim Miles is a 32 y.o. G1P0101 here today for evaluation of bump on  the right side of her vulva. Present for a few weeks. No pain, no drainage. Single lesion. She denies any abnormal vaginal discharge, bleeding, pelvic pain or other concerns.    Past Medical History:  Diagnosis Date   Depression    Gestational diabetes    Hepatitis C antibody test positive-RNA negative  01/06/2022   Hypertension    chronic ,  prior to pregnancy   Menstrual migraine without status migrainosus, not intractable 01/01/2020   well controlled with junel-fe - pt skipping placebo and withdrawal bleed - reviewed OCP, monitoring, SE/risk.   Pre-diabetes    Retained products of conception, postpartum    Severe preeclampsia, third trimester 04/2022    Past Surgical History:  Procedure Laterality Date   CESAREAN SECTION N/A 05/18/2022   Procedure: CESAREAN SECTION;  Surgeon: Venora Maples, MD;  Location: [facility];  Service: Obstetrics;  Laterality: N/A;   DILATION AND EVACUATION N/A 06/10/2022   Procedure: DILATATION AND EVACUATION;  Surgeon: Catalina Antigua, MD;  Location: [facility];  Service: Gynecology;  Laterality: N/A;    The following portions of the patient's history were reviewed and updated as appropriate: allergies, current medications, past family history, past medical history, past social history, past surgical history and problem list.   Health Maintenance:  Normal pap and negative HRHPV on 03/05/2021.    Review of Systems:  Pertinent items noted in HPI and remainder of comprehensive ROS otherwise negative.  Physical Exam:  Vitals deferred CONSTITUTIONAL: Well-developed, well-nourished female in no acute distress.  SKIN: No rash noted. Not diaphoretic. No erythema. No pallor. MUSCULOSKELETAL: Normal range of motion. No edema noted. NEUROLOGIC: Alert and oriented to person, place, and time. Normal muscle tone  coordination. No cranial nerve deficit noted. PSYCHIATRIC: Normal mood and affect. Normal behavior. Normal judgment and thought content. CARDIOVASCULAR: Normal heart rate noted RESPIRATORY: Effort and breath sounds normal, no problems with respiration noted ABDOMEN: No masses noted. No other overt distention noted.   PELVIC: Small 3 mm superficial lesion on the lateral edge of right labium. No erythema, no drainage, no tenderness. Normal appearing external genitalia; normal urethral meatus; normal appearing distal vaginal mucosa.  No abnormal discharge noted.  Performed in the presence of a chaperone     Assessment and Plan:    1. Closed comedone Benign finding of a comedone, patient reassured. No intervention needed.  Return for any gynecologic concerns.    I spent 20 minutes dedicated to the care of this patient including pre-visit review of records, face to face time with the patient discussing her conditions and treatments and post visit orders.    Jaynie Collins, MD, FACOG Obstetrician & Gynecologist, [facility] [employer], [facility]

## 2023-02-22 ENCOUNTER — Other Ambulatory Visit: Payer: Self-pay

## 2023-02-22 DIAGNOSIS — Z0000 Encounter for general adult medical examination without abnormal findings: Secondary | ICD-10-CM

## 2023-02-22 DIAGNOSIS — Z130 Encounter for screening for diseases of the blood and blood-forming organs and certain disorders involving the immune mechanism: Secondary | ICD-10-CM

## 2023-03-01 ENCOUNTER — Other Ambulatory Visit: Payer: Managed Care, Other (non HMO)

## 2023-03-01 DIAGNOSIS — Z130 Encounter for screening for diseases of the blood and blood-forming organs and certain disorders involving the immune mechanism: Secondary | ICD-10-CM

## 2023-03-01 DIAGNOSIS — Z0000 Encounter for general adult medical examination without abnormal findings: Secondary | ICD-10-CM

## 2023-03-02 LAB — CBC
Hematocrit: 39.9 % (ref 34.0–46.6)
Hemoglobin: 12.9 g/dL (ref 11.1–15.9)
MCH: 27.9 pg (ref 26.6–33.0)
MCHC: 32.3 g/dL (ref 31.5–35.7)
MCV: 86 fL (ref 79–97)
Platelets: 362 10*3/uL (ref 150–450)
RBC: 4.63 x10E6/uL (ref 3.77–5.28)
RDW: 13.8 % (ref 11.7–15.4)
WBC: 8.8 10*3/uL (ref 3.4–10.8)

## 2023-03-02 LAB — COMPREHENSIVE METABOLIC PANEL
ALT: 11 IU/L (ref 0–32)
AST: 17 IU/L (ref 0–40)
Albumin/Globulin Ratio: 1.4 (ref 1.2–2.2)
Albumin: 4.3 g/dL (ref 3.9–4.9)
Alkaline Phosphatase: 117 IU/L (ref 44–121)
BUN/Creatinine Ratio: 16 (ref 9–23)
BUN: 11 mg/dL (ref 6–20)
Bilirubin Total: 0.2 mg/dL (ref 0.0–1.2)
CO2: 22 mmol/L (ref 20–29)
Calcium: 9.4 mg/dL (ref 8.7–10.2)
Chloride: 102 mmol/L (ref 96–106)
Creatinine, Ser: 0.7 mg/dL (ref 0.57–1.00)
Globulin, Total: 3 g/dL (ref 1.5–4.5)
Glucose: 111 mg/dL — ABNORMAL HIGH (ref 70–99)
Potassium: 4.4 mmol/L (ref 3.5–5.2)
Sodium: 139 mmol/L (ref 134–144)
Total Protein: 7.3 g/dL (ref 6.0–8.5)
eGFR: 116 mL/min/{1.73_m2} (ref >59)

## 2023-03-02 LAB — LIPID PANEL
Chol/HDL Ratio: 7.5 ratio — ABNORMAL HIGH (ref 0.0–4.4)
Cholesterol, Total: 264 mg/dL — ABNORMAL HIGH (ref 100–199)
HDL: 35 mg/dL — ABNORMAL LOW (ref >39)
LDL Chol Calc (NIH): 189 mg/dL — ABNORMAL HIGH (ref 0–99)
Triglycerides: 208 mg/dL — ABNORMAL HIGH (ref 0–149)
VLDL Cholesterol Cal: 40 mg/dL (ref 5–40)

## 2023-03-02 LAB — HEMOGLOBIN A1C
Est. average glucose Bld gHb Est-mCnc: 126 mg/dL
Hgb A1c MFr Bld: 6 % — ABNORMAL HIGH (ref 4.8–5.6)

## 2023-03-02 LAB — TSH: TSH: 0.933 u[IU]/mL (ref 0.450–4.500)

## 2023-03-08 ENCOUNTER — Ambulatory Visit: Payer: Managed Care, Other (non HMO) | Admitting: Nurse Practitioner

## 2023-03-08 DIAGNOSIS — E669 Obesity, unspecified: Secondary | ICD-10-CM | POA: Insufficient documentation

## 2023-03-08 DIAGNOSIS — Z6838 Body mass index (BMI) 38.0-38.9, adult: Secondary | ICD-10-CM | POA: Insufficient documentation

## 2023-03-08 NOTE — Assessment & Plan Note (Signed)
Stable. Continue lexapro as prescribed  °

## 2023-03-08 NOTE — Assessment & Plan Note (Signed)
Discussed lowering calorie intake to 1500 calories per day and incorporating exercise into daily routine to help lose weight.  °

## 2023-03-08 NOTE — Assessment & Plan Note (Signed)
Restart labetalol at 100 mg twice daily.  -limit salt in diet and increase water.  -recheck in 4 weeks.

## 2023-03-15 ENCOUNTER — Encounter: Payer: Self-pay | Admitting: Nurse Practitioner

## 2023-03-23 ENCOUNTER — Encounter: Payer: Self-pay | Admitting: Nurse Practitioner

## 2023-03-23 ENCOUNTER — Ambulatory Visit: Payer: Managed Care, Other (non HMO) | Admitting: Nurse Practitioner

## 2023-03-23 VITALS — BP 117/87 | HR 75 | Ht 59.0 in | Wt 193.1 lb

## 2023-03-23 DIAGNOSIS — E782 Mixed hyperlipidemia: Secondary | ICD-10-CM

## 2023-03-23 DIAGNOSIS — N764 Abscess of vulva: Secondary | ICD-10-CM

## 2023-03-23 DIAGNOSIS — I10 Essential (primary) hypertension: Secondary | ICD-10-CM

## 2023-03-23 DIAGNOSIS — R7303 Prediabetes: Secondary | ICD-10-CM | POA: Diagnosis not present

## 2023-03-23 MED ORDER — SULFAMETHOXAZOLE-TRIMETHOPRIM 800-160 MG PO TABS
1.0000 | ORAL_TABLET | Freq: Two times a day (BID) | ORAL | 0 refills | Status: DC
Start: 2023-03-23 — End: 2023-06-23

## 2023-03-23 MED ORDER — ROSUVASTATIN CALCIUM 5 MG PO TABS: 5.0000 mg | ORAL_TABLET | Freq: Every day | ORAL | 1 refills | Status: DC

## 2023-03-23 NOTE — Progress Notes (Signed)
Established patient visit   Patient: Kim Miles   DOB: 03-11-1991   32 y.o. Female  MRN: [id number] Visit Date: 03/23/2023   Chief Complaint  Patient presents with   Hypertension   Subjective    HPI  Follow up  -hypertension -concern that heart rate not going up with exercise -she is on beta blocker   -has lump, right side of vagina.  --was smaller. Did see GYN. Was told it was nothing  --now getting larger, tender. Gets worse with walking,exercising, and with intercourse.   Recent labs  -moderate, generalized elevation of lipids  -elevated glucose with HgbA1c 6.0.  -she did have gestational diabetes  -baby is now 67 months old.    Medications: Outpatient Medications Prior to Visit  Medication Sig   escitalopram (LEXAPRO) 10 MG tablet TAKE 1 TABLET BY MOUTH AT BEDTIME   labetalol (NORMODYNE) 100 MG tablet Take 1 tablet (100 mg total) by mouth 2 (two) times daily.   norethindrone (MICRONOR) 0.35 MG tablet Take 1 tablet (0.35 mg total) by mouth daily.   No facility-administered medications prior to visit.    Review of Systems See HPI    Last CBC Lab Results  Component Value Date   WBC 8.8 03/01/2023   HGB 12.9 03/01/2023   HCT 39.9 03/01/2023   MCV 86 03/01/2023   MCH 27.9 03/01/2023   RDW 13.8 03/01/2023   PLT 362 03/01/2023   Last metabolic panel Lab Results  Component Value Date   GLUCOSE 111 (H) 03/01/2023   NA 139 03/01/2023   K 4.4 03/01/2023   CL 102 03/01/2023   CO2 22 03/01/2023   BUN 11 03/01/2023   CREATININE 0.70 03/01/2023   EGFR 116 03/01/2023   CALCIUM 9.4 03/01/2023   PROT 7.3 03/01/2023   ALBUMIN 4.3 03/01/2023   LABGLOB 3.0 03/01/2023   AGRATIO 1.4 03/01/2023   BILITOT 0.2 03/01/2023   ALKPHOS 117 03/01/2023   AST 17 03/01/2023   ALT 11 03/01/2023   ANIONGAP 8 06/08/2022   Last lipids Lab Results  Component Value Date   CHOL 264 (H) 03/01/2023   HDL 35 (L) 03/01/2023   LDLCALC 189 (H) 03/01/2023   TRIG 208 (H)  03/01/2023   CHOLHDL 7.5 (H) 03/01/2023   Last hemoglobin A1c Lab Results  Component Value Date   HGBA1C 6.0 (H) 03/01/2023   Last thyroid functions Lab Results  Component Value Date   TSH 0.933 03/01/2023        Objective     Today's Vitals   03/23/23 1108 03/23/23 1126  BP: (Abnormal) 127/92 117/87  Pulse: 75   SpO2: 90%   Weight: 193 lb 1.9 oz (87.6 kg)   Height: 4\' 11"  (1.499 m)    Body mass index is 39.01 kg/m.  BP Readings from Last 3 Encounters:  03/23/23 117/87  02/03/23 (Abnormal) 139/101  07/29/22 (Abnormal) 141/86    Wt Readings from Last 3 Encounters:  03/23/23 193 lb 1.9 oz (87.6 kg)  02/03/23 192 lb 6.4 oz (87.3 kg)  07/29/22 191 lb (86.6 kg)    Physical Exam Vitals and nursing note reviewed.  Constitutional:      Appearance: Normal appearance. She is well-developed.  HENT:     Head: Normocephalic and atraumatic.     Nose: Nose normal.     Mouth/Throat:     Mouth: Mucous membranes are moist.     Pharynx: Oropharynx is clear.  Eyes:     Extraocular Movements: Extraocular movements intact.  Conjunctiva/sclera: Conjunctivae normal.     Pupils: Pupils are equal, round, and reactive to light.  Neck:     Vascular: No carotid bruit.  Cardiovascular:     Rate and Rhythm: Normal rate and regular rhythm.     Pulses: Normal pulses.     Heart sounds: Normal heart sounds.  Pulmonary:     Effort: Pulmonary effort is normal.     Breath sounds: Normal breath sounds.  Abdominal:     Palpations: Abdomen is soft.  Genitourinary:    Comments: Tender, abscess type lesion along the left labia majora. Red, warm, and hard in contour. No drainage noted at this time.  Musculoskeletal:        General: Normal range of motion.     Cervical back: Normal range of motion and neck supple.  Lymphadenopathy:     Cervical: No cervical adenopathy.  Skin:    General: Skin is warm and dry.     Capillary Refill: Capillary refill takes less than 2 seconds.   Neurological:     General: No focal deficit present.     Mental Status: She is alert and oriented to person, place, and time.  Psychiatric:        Mood and Affect: Mood normal.        Behavior: Behavior normal.        Thought Content: Thought content normal.        Judgment: Judgment normal.      Assessment & Plan    Abscess of labia majora Assessment & Plan: Lesion of left labia majora appears to be an abscess. -Start Bactrim DS twice daily for next 10 days. -Apply warm, moist compresses to the area in 20-minute increments to help promote drainage of infectious fluid. -She understands to contact this office or GYN if symptoms do not improve in the next 5 to 7 days.  Orders: -     Sulfamethoxazole-Trimethoprim; Take 1 tablet by mouth 2 (two) times daily.  Dispense: 20 tablet; Refill: 0  Mixed hyperlipidemia Assessment & Plan: Most recent lipid panel shows -  Lipid Panel     Component Value Date/Time   CHOL 264 (H) 03/01/2023 0909   TRIG 208 (H) 03/01/2023 0909   HDL 35 (L) 03/01/2023 0909   CHOLHDL 7.5 (H) 03/01/2023 0909   CHOLHDL 5.7 (H) 06/11/2021 1127   LDLCALC 189 (H) 03/01/2023 0909   LDLCALC 182 (H) 06/11/2021 1127   LABVLDL 40 03/01/2023 0909  Start Crestor 5 mg tablets every evening with dinner. -Recommend she limit intake of fried and fatty foods. She should increase intake of lean proteins and green leafy vegetables. Adding exercise into daily routine will also be beneficial.    Orders: -     Rosuvastatin Calcium; Take 1 tablet (5 mg total) by mouth daily.  Dispense: 90 tablet; Refill: 1  Essential hypertension Assessment & Plan: Blood pressure stable. -Continue medication as prescribed. -Reassured patient that reduced heart rate is normal due to medication she is taking for her blood pressure. -Recommend she stay hydrated, avoid salt, and incorporate exercise into her daily routine.   Prediabetes Assessment & Plan: Recent hemoglobin A1c  6.0. -Recommend she limit intake of carbohydrates and excess sugar. -Increase daily intake of water. -Encouraged her to incorporate exercise into daily routine.      Return in about 3 months (around 06/23/2023) for blood pressure, HLD - check Fasting ipids, CMP, and HgbA1c 1 week prior .         Herbert Seta  Quita Skye, NP  [place] [facility] [phone] (phone) [phone] (fax)  [place]

## 2023-04-04 DIAGNOSIS — N764 Abscess of vulva: Secondary | ICD-10-CM | POA: Insufficient documentation

## 2023-04-04 DIAGNOSIS — E782 Mixed hyperlipidemia: Secondary | ICD-10-CM | POA: Insufficient documentation

## 2023-04-04 NOTE — Assessment & Plan Note (Signed)
Recent hemoglobin A1c 6.0. -Recommend she limit intake of carbohydrates and excess sugar. -Increase daily intake of water. -Encouraged her to incorporate exercise into daily routine.

## 2023-04-04 NOTE — Assessment & Plan Note (Signed)
Most recent lipid panel shows -  Lipid Panel     Component Value Date/Time   CHOL 264 (H) 03/01/2023 0909   TRIG 208 (H) 03/01/2023 0909   HDL 35 (L) 03/01/2023 0909   CHOLHDL 7.5 (H) 03/01/2023 0909   CHOLHDL 5.7 (H) 06/11/2021 1127   LDLCALC 189 (H) 03/01/2023 0909   LDLCALC 182 (H) 06/11/2021 1127   LABVLDL 40 03/01/2023 0909  Start Crestor 5 mg tablets every evening with dinner. -Recommend she limit intake of fried and fatty foods. She should increase intake of lean proteins and green leafy vegetables. Adding exercise into daily routine will also be beneficial.

## 2023-04-04 NOTE — Assessment & Plan Note (Signed)
Lesion of left labia majora appears to be an abscess. -Start Bactrim DS twice daily for next 10 days. -Apply warm, moist compresses to the area in 20-minute increments to help promote drainage of infectious fluid. -She understands to contact this office or GYN if symptoms do not improve in the next 5 to 7 days.

## 2023-04-04 NOTE — Assessment & Plan Note (Signed)
Blood pressure stable. -Continue medication as prescribed. -Reassured patient that reduced heart rate is normal due to medication she is taking for her blood pressure. -Recommend she stay hydrated, avoid salt, and incorporate exercise into her daily routine.

## 2023-04-11 ENCOUNTER — Encounter: Payer: Self-pay | Admitting: Nurse Practitioner

## 2023-04-30 IMAGING — US US MFM OB FOLLOW-UP
1 series · 14 of 28 positions shown · non-contrast
Comparison: none

[Series 1: us mfm ob follow-up · 47 acquisitions, 14 frames shown]
[im 2/47]
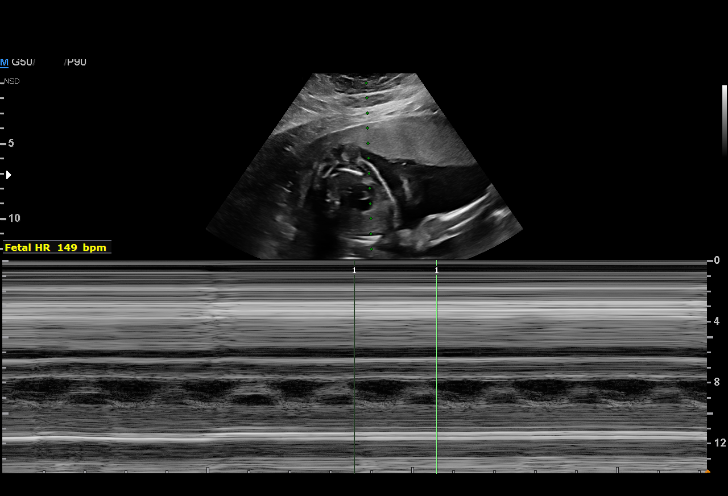
[im 6/47]
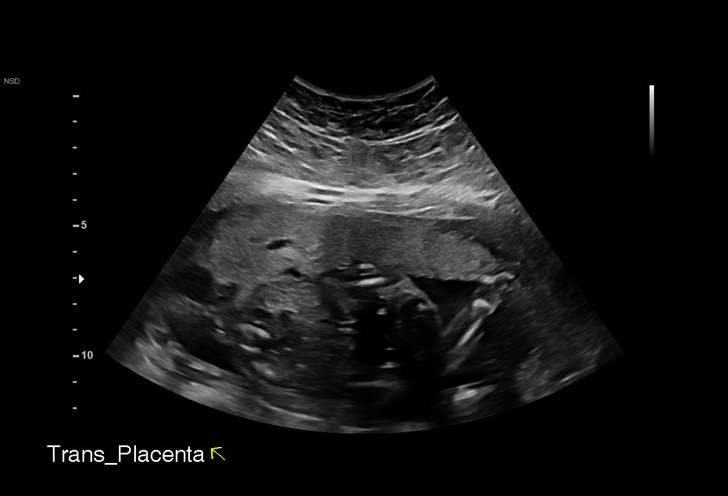
[im 9/47]
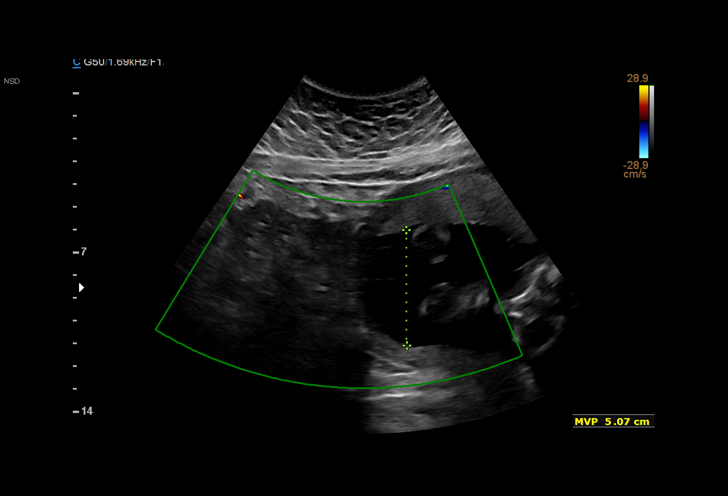
[im 12/47]
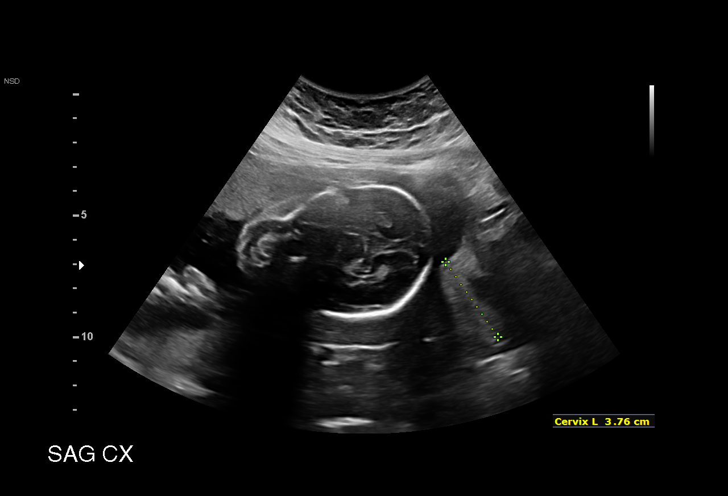
[im 16/47]
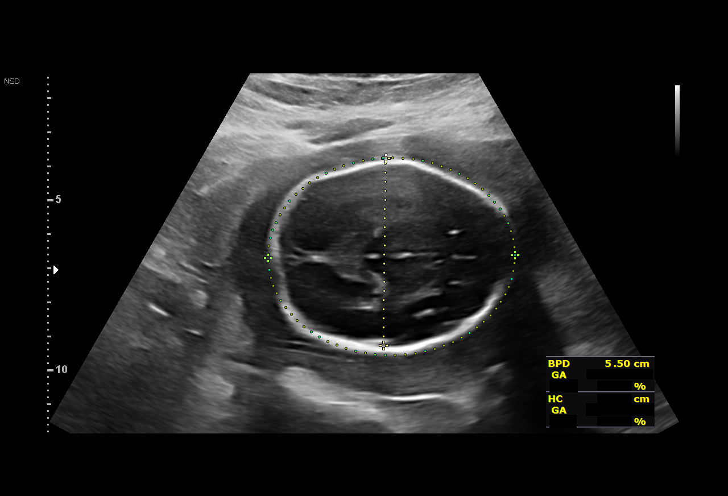
[im 19/47]
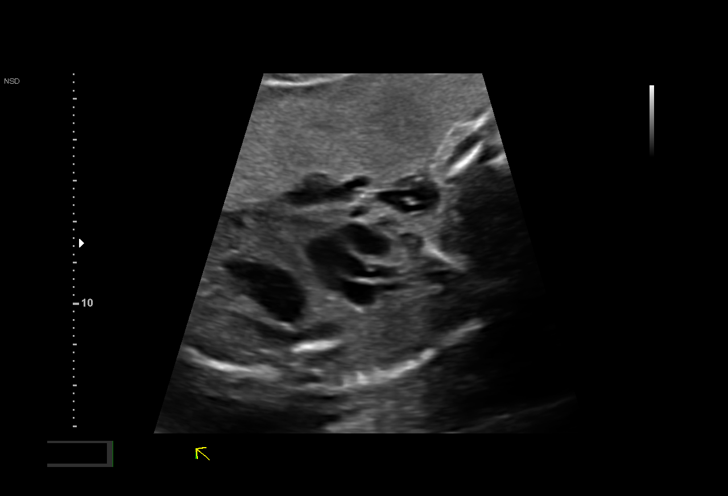
[im 23/47]
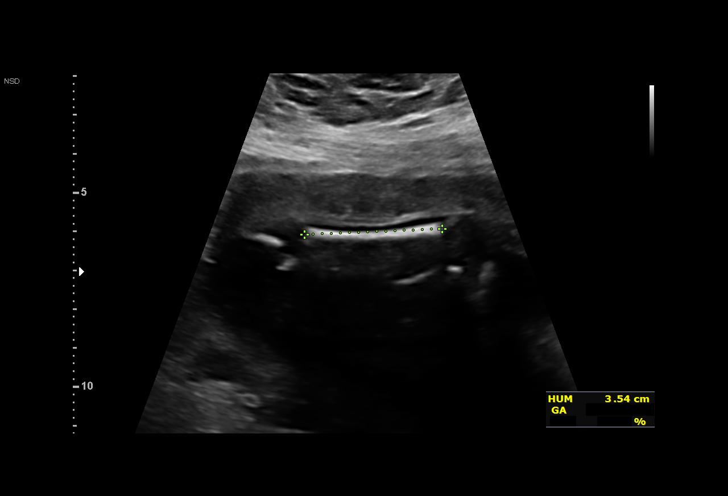
[im 26/47]
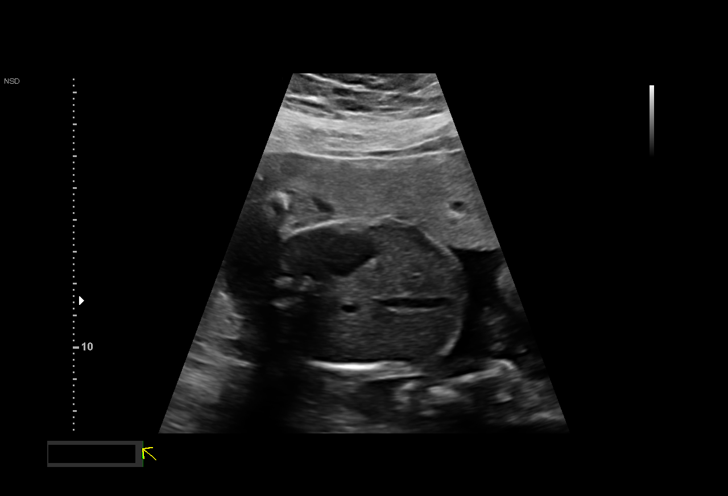
[im 29/47]
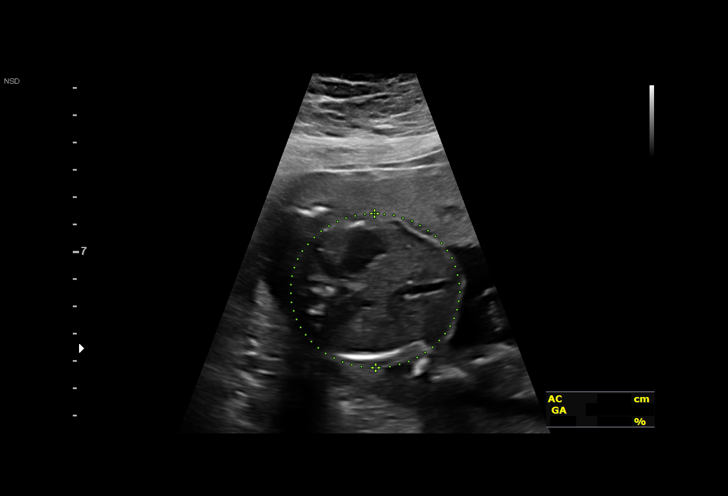
[im 33/47]
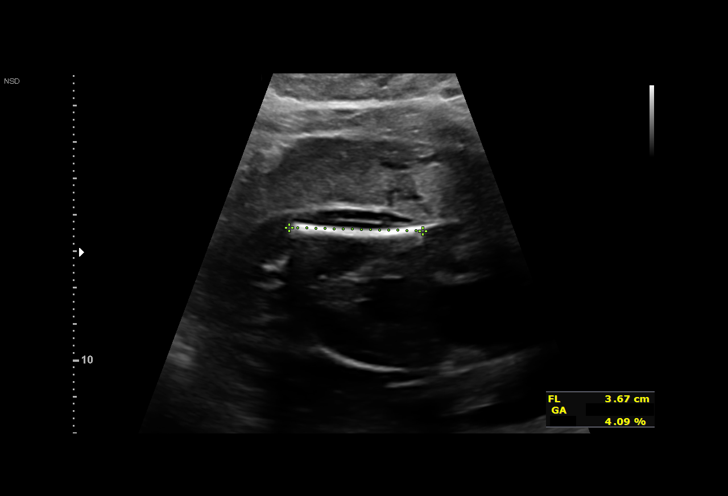
[im 36/47]
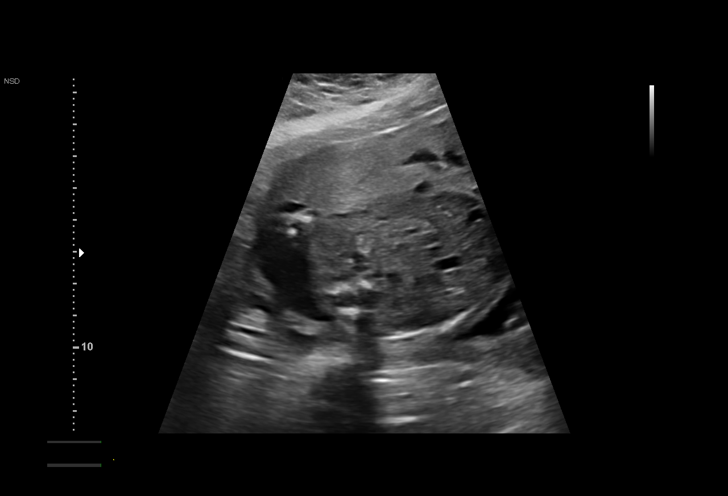
[im 40/47]
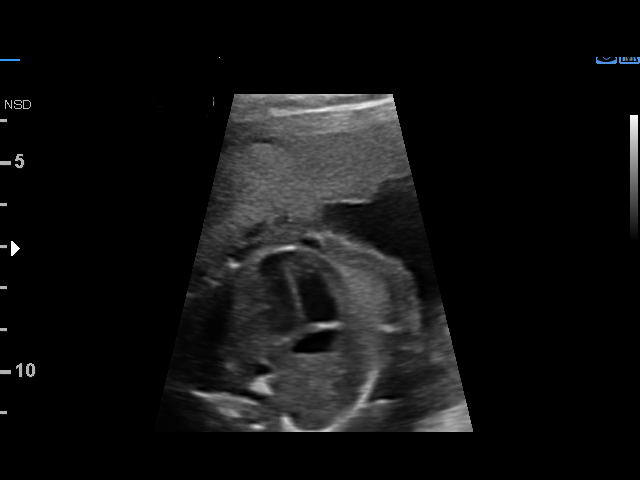
[im 43/47]
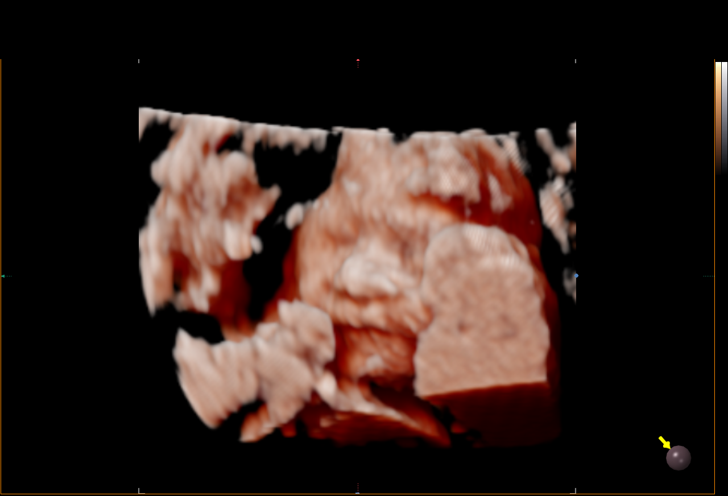
[im 47/47]
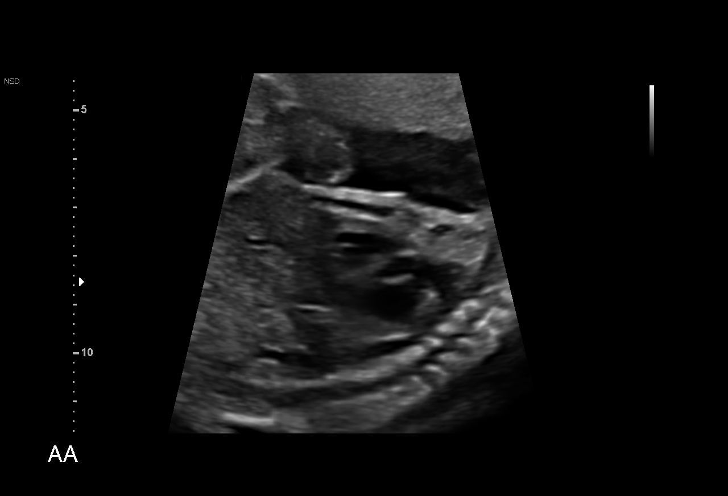

[14 of 28 positions shown; findings below may reference images not displayed]

Indications

 Hypertension - Chronic/Pre-existing
 23 weeks gestation of pregnancy
 Obesity complicating pregnancy, second
 trimester
 Diabetes - Pregestational, 2nd trimester (diet
 control)
 LR-NIPS/AFP-Neg/Horizon-Neg
Fetal Evaluation

 Num Of Fetuses:         1
 Fetal Heart Rate(bpm):  149
 Cardiac Activity:       Observed
 Presentation:           Cephalic
 Placenta:               Anterior
 P. Cord Insertion:      Previously Visualized

 Amniotic Fluid
 AFI FV:      Within normal limits

                             Largest Pocket(cm)

Biometry

 BPD:        55  mm     G. Age:  22w 5d         26  %    CI:        72.35   %    70 - 86
                                                         FL/HC:      18.2   %    19.2 -
 HC:      205.7  mm     G. Age:  22w 5d         15  %    HC/AC:      1.10        1.05 -
 AC:      187.4  mm     G. Age:  23w 4d         49  %    FL/BPD:     68.2   %    71 - 87
 FL:       37.5  mm     G. Age:  22w 0d          7  %    FL/AC:      20.0   %    20 - 24
 HUM:      35.1  mm     G. Age:  22w 0d         17  %
 LV:        5.8  mm
 Est. FW:     537  gm      1 lb 3 oz     22  %
OB History

 Gravidity:    1         Term:   0        Prem:   0        SAB:   0
 TOP:          0       Ectopic:  0        Living: 0
Gestational Age

 LMP:           23w 2d        Date:  09/29/21                 EDD:   07/06/22
 U/S Today:     22w 5d                                        EDD:   07/10/22
 Best:          23w 2d     Det. By:  LMP  (09/29/21)          EDD:   07/06/22
Anatomy

 Cranium:               Appears normal         Aortic Arch:            Appears normal
 Cavum:                 Previously seen        Ductal Arch:            Previously seen
 Ventricles:            Appears normal         Diaphragm:              Appears normal
 Choroid Plexus:        Previously seen        Stomach:                Appears normal, left
                                                                       sided
 Cerebellum:            Previously seen        Abdomen:                Appears normal
 Posterior Fossa:       Previously seen        Abdominal Wall:         Previously seen
 Nuchal Fold:           Previously seen        Cord Vessels:           Previously seen
 Face:                  Orbits and profile     Kidneys:                Appear normal
                        previously seen
 Lips:                  Previously seen        Bladder:                Appears normal
 Thoracic:              Appears normal         Spine:                  Previously seen
 Heart:                 Appears normal         Upper Extremities:      Previously seen
                        (4CH, axis, and
                        situs)
 RVOT:                  Appears normal         Lower Extremities:      Previously seen
 LVOT:                  Appears normal

 Other:  VC, 3VV and 3VTV visualized. Nasal bone, lenses, maxilla, mandible
         and falx visualized. Feet and open hands/5th digits visualized. Fetus
         appears to be a male.
Cervix Uterus Adnexa

 Cervix
 Length:           3.76  cm.
 Normal appearance by transabdominal scan.

 Right Ovary
 Previously seen

 Left Ovary
 Previously seen.
Impression

 Follow up growth due to elevated BMI, CHTN and GC3BI
 Normal interval growth with measurements consistent with
 dates
 Good fetal movement and amniotic fluid volume

 Blood pressure is 126/90 mmHg.

 She reports good blood sugars.
Recommendations

 Continue serial growth scheduled for 28 weeks. Will need
 growth at [DATE] weeks

 Initiate weekly testing at 32 weeks.

## 2023-05-04 ENCOUNTER — Other Ambulatory Visit: Payer: Self-pay | Admitting: Nurse Practitioner

## 2023-05-04 DIAGNOSIS — I10 Essential (primary) hypertension: Secondary | ICD-10-CM

## 2023-06-03 ENCOUNTER — Other Ambulatory Visit: Payer: Self-pay | Admitting: Family Medicine

## 2023-06-03 DIAGNOSIS — Z0000 Encounter for general adult medical examination without abnormal findings: Secondary | ICD-10-CM

## 2023-06-03 DIAGNOSIS — I10 Essential (primary) hypertension: Secondary | ICD-10-CM

## 2023-06-03 DIAGNOSIS — R7303 Prediabetes: Secondary | ICD-10-CM

## 2023-06-03 DIAGNOSIS — E782 Mixed hyperlipidemia: Secondary | ICD-10-CM

## 2023-06-04 IMAGING — US US MFM OB FOLLOW-UP
1 series · 13 of 28 positions shown · non-contrast
Comparison: none

[Series 1: us mfm ob follow-up · 13 of 48 slices shown]
[im 2/48]
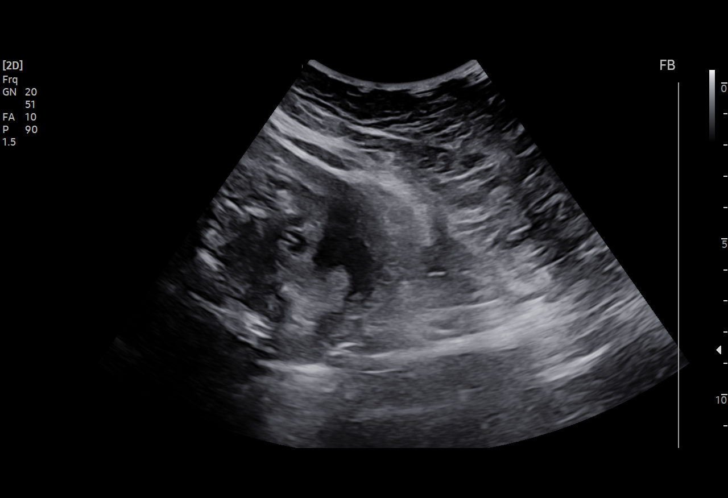
[im 6/48]
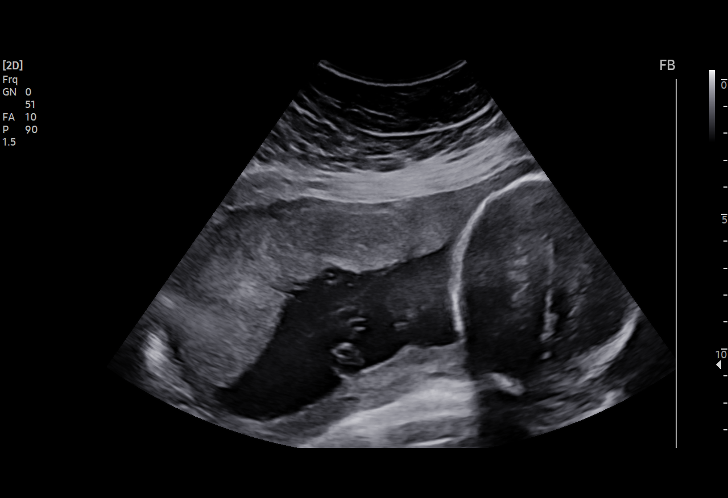
[im 9/48]
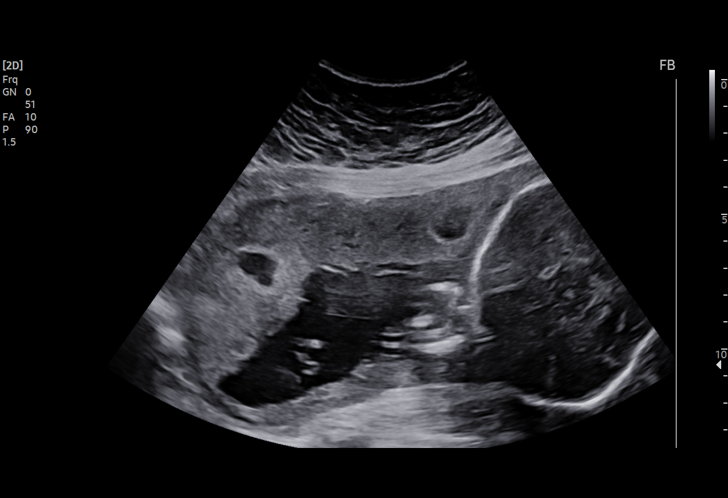
[im 13/48]
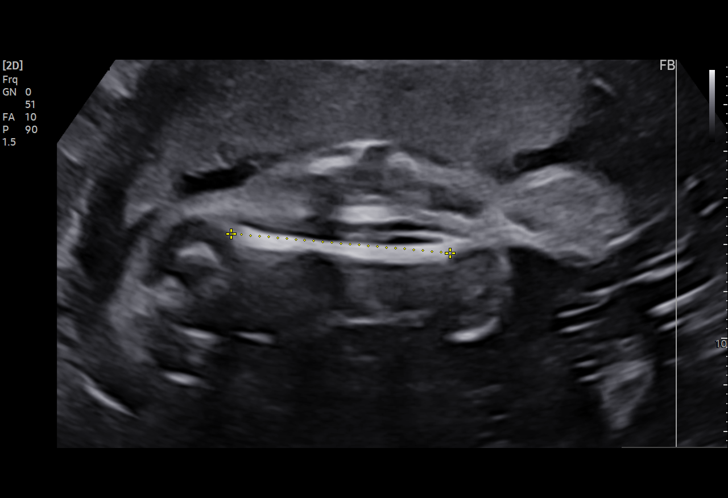
[im 16/48]
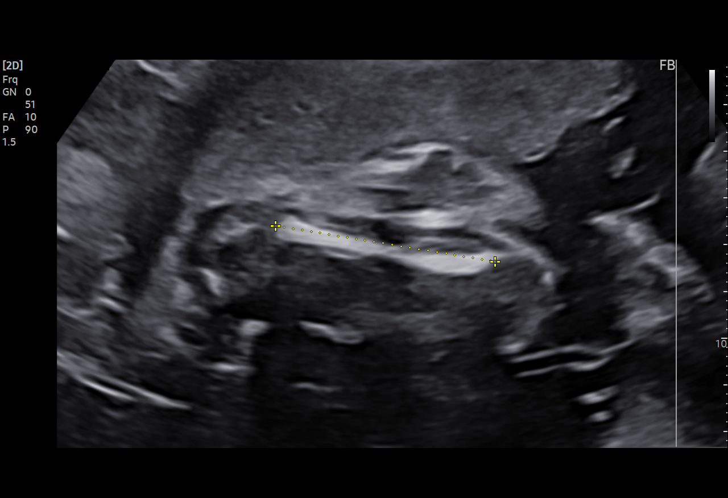
[im 20/48]
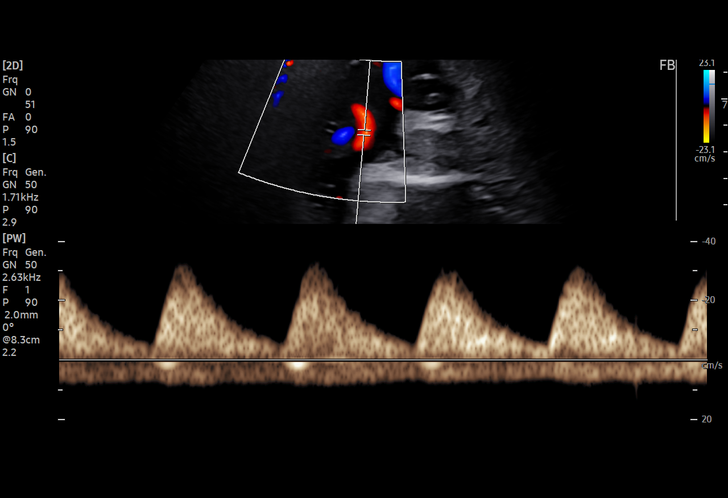
[im 25/48]
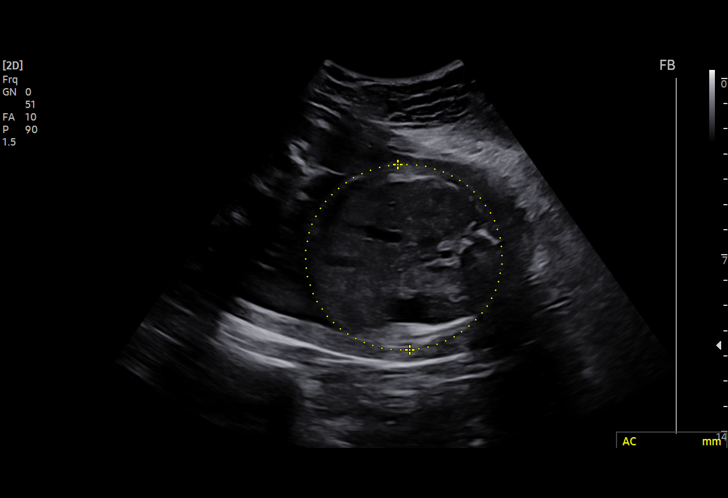
[im 28/48]
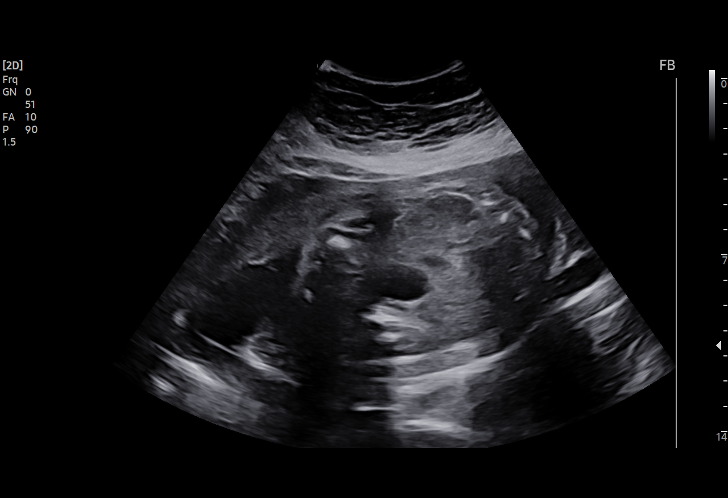
[im 32/48]
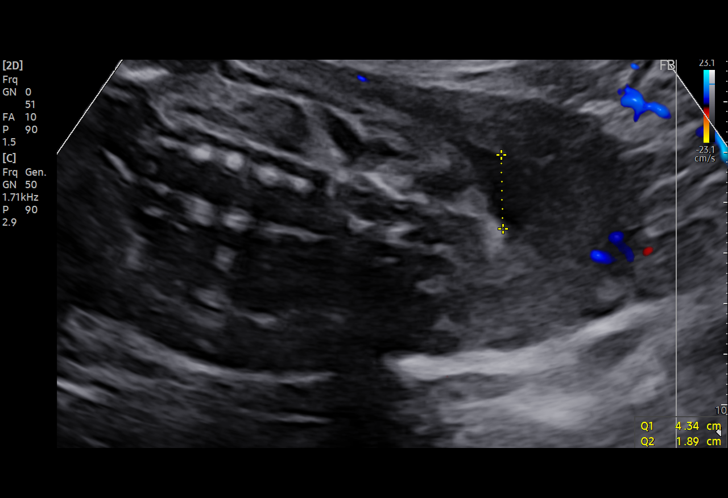
[im 35/48]
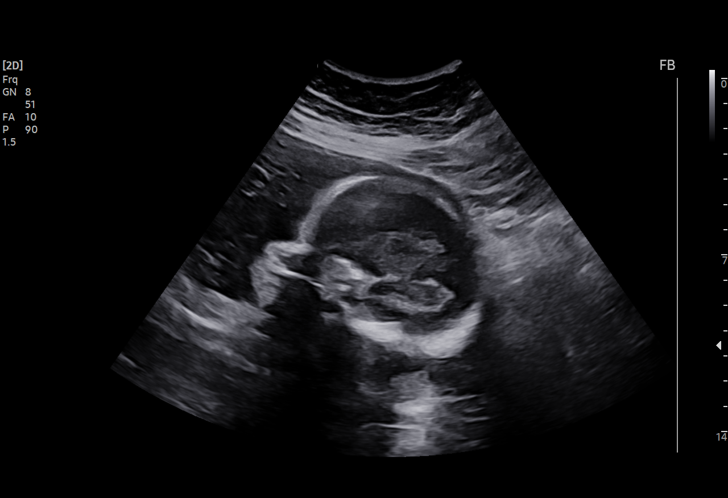
[im 39/48]
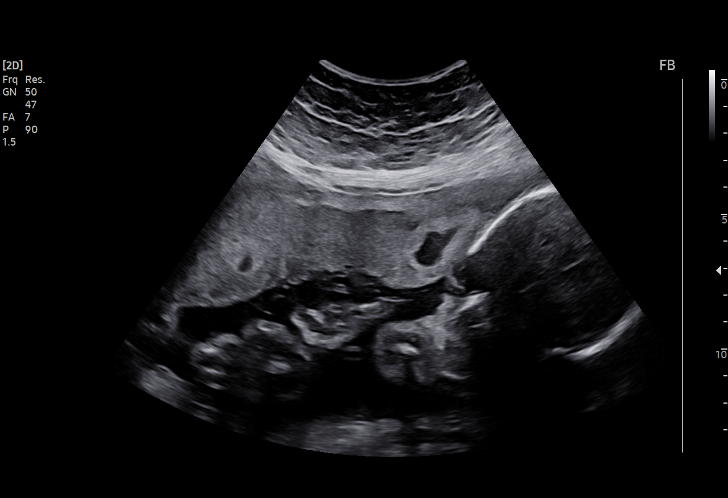
[im 42/48]
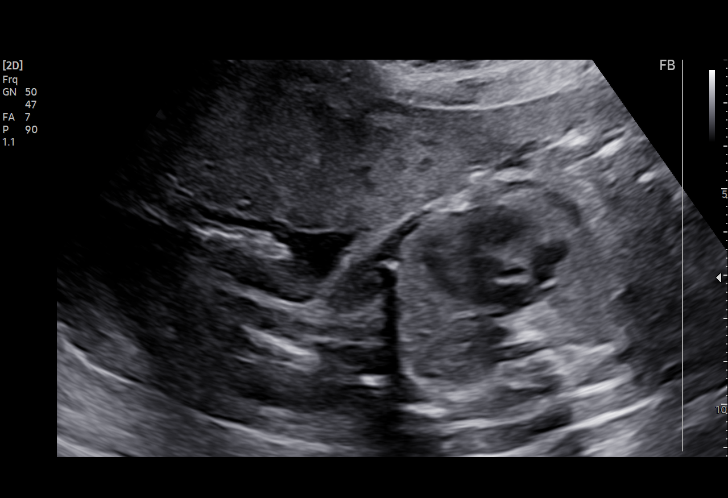
[im 46/48]
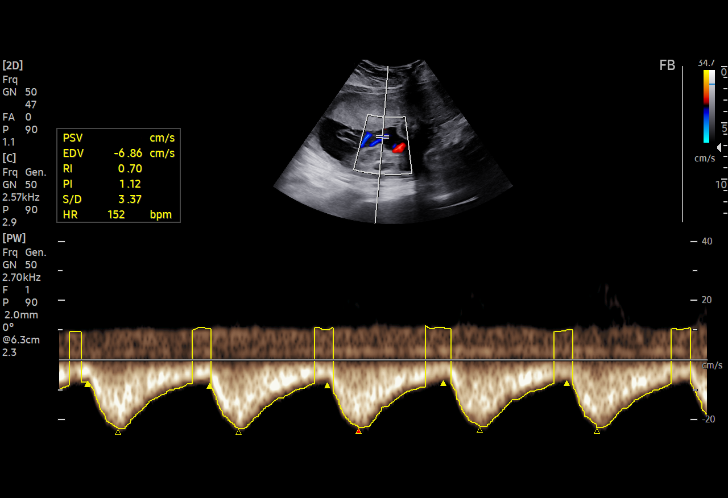

[13 of 28 positions shown; findings below may reference images not displayed]

AZRAYILOVA

Indications

 Obesity complicating pregnancy, third
 trimester (nifedipine)
 Diabetes - Pregestational,3rd trimester (diet
 control)
 Hypertension - Chronic/Pre-existing
 LR-NIPS/AFP-Neg/Horizon-Neg
 28 weeks gestation of pregnancy
Fetal Evaluation

 Num Of Fetuses:         1
 Fetal Heart Rate(bpm):  133
 Cardiac Activity:       Observed
 Presentation:           Cephalic
 Placenta:               Anterior
 P. Cord Insertion:      Previously Visualized

 Amniotic Fluid
 AFI FV:      Within normal limits

 AFI Sum(cm)     %Tile       Largest Pocket(cm)
 12              28

 RUQ(cm)       RLQ(cm)       LUQ(cm)        LLQ(cm)

Biometry
 BPD:     69.53  mm     G. Age:  28w 0d         27  %    CI:        73.31   %    70 - 86
                                                         FL/HC:      18.8   %    18.8 -
 HC:    258.07   mm     G. Age:  28w 0d         14  %    HC/AC:      1.11        1.05 -
 AC:    233.07   mm     G. Age:  27w 5d         24  %    FL/BPD:     69.9   %    71 - 87
 FL:      48.59  mm     G. Age:  26w 2d        2.5  %    FL/AC:      20.8   %    20 - 24

 Est. FW:    4410  gm      2 lb 5 oz     10  %
OB History

 Gravidity:    1         Term:   0        Prem:   0        SAB:   0
 TOP:          0       Ectopic:  0        Living: 0
Gestational Age

 LMP:           28w 2d        Date:  09/29/21                  EDD:   07/06/22
 U/S Today:     27w 4d                                        EDD:   07/11/22
 Best:          28w 2d     Det. By:  LMP  (09/29/21)          EDD:   07/06/22
Anatomy

 Cranium:               Appears normal         Aortic Arch:            Previously seen
 Cavum:                 Appears normal         Ductal Arch:            Previously seen
 Ventricles:            Previously seen        Diaphragm:              Appears normal
 Choroid Plexus:        Previously seen        Stomach:                Appears normal, left
                                                                       sided
 Cerebellum:            Previously seen        Abdomen:                Appears normal
 Posterior Fossa:       Previously seen        Abdominal Wall:         Previously seen
 Nuchal Fold:           Previously seen        Cord Vessels:           Previously seen
 Face:                  Orbits and profile     Kidneys:                Appear normal
                        previously seen
 Lips:                  Previously seen        Bladder:                Appears normal
 Thoracic:              Appears normal         Spine:                  Previously seen
 Heart:                 Previously seen        Upper Extremities:      Previously seen
 RVOT:                  Previously seen        Lower Extremities:      Previously seen
 LVOT:                  Previously seen

 Other:  VC, 3VV and 3VTV previously visualized. Nasal bone, lenses,
         maxilla, mandible and falx previously visualized. Feet and open
         hands/5th digits previously visualized. Fetus appears to be a male.
Doppler - Fetal Vessels

 Umbilical Artery
  S/D     %tile      RI    %tile      PI    %tile     PSV
                                                    (cm/s)
  3.83       85    0.74       85    1.[REDACTED]

Cervix Uterus Adnexa

 Adnexa
 No abnormality visualized.
Comments
 This patient was seen for a follow up growth scan due to
 maternal obesity, chronic hypertension treated with
 nifedipine, and diet-controlled gestational diabetes.  The
 patient's blood pressures today were 137/104, 142/100, and
 151/93.  She denies any signs or symptoms of preeclampsia.
 She had PIH labs and a P/C ratio drawn 6 days ago which
 were all negative for preeclampsia.
 She was informed that the fetal growth measures at the 10th
 percentile for her gestational age.  There was normal
 amniotic fluid noted today.
 Doppler studies of the umbilical arteries performed today
 shows a normal S/D ratio.  There were no signs of absent or
 reversed end-diastolic flow noted.
 The increased risk of IUGR due to her elevated blood
 pressures was discussed.  Preeclampsia precautions were
 reviewed today.
 Due to chronic hypertension, we will start weekly BPP's at 32
 weeks.
 A follow-up growth scan and BPP was scheduled in 3 weeks.

## 2023-06-16 ENCOUNTER — Other Ambulatory Visit: Payer: Managed Care, Other (non HMO)

## 2023-06-16 DIAGNOSIS — R7303 Prediabetes: Secondary | ICD-10-CM

## 2023-06-16 DIAGNOSIS — E782 Mixed hyperlipidemia: Secondary | ICD-10-CM

## 2023-06-16 DIAGNOSIS — Z0000 Encounter for general adult medical examination without abnormal findings: Secondary | ICD-10-CM

## 2023-06-16 DIAGNOSIS — I10 Essential (primary) hypertension: Secondary | ICD-10-CM

## 2023-06-17 ENCOUNTER — Other Ambulatory Visit: Payer: Self-pay | Admitting: Family Medicine

## 2023-06-17 LAB — COMPREHENSIVE METABOLIC PANEL
ALT: 7 IU/L (ref 0–32)
AST: 18 IU/L (ref 0–40)
Albumin: 4.1 g/dL (ref 3.9–4.9)
Alkaline Phosphatase: 112 IU/L (ref 44–121)
BUN/Creatinine Ratio: 12 (ref 9–23)
BUN: 9 mg/dL (ref 6–20)
Bilirubin Total: 0.3 mg/dL (ref 0.0–1.2)
CO2: 22 mmol/L (ref 20–29)
Calcium: 8.8 mg/dL (ref 8.7–10.2)
Chloride: 101 mmol/L (ref 96–106)
Creatinine, Ser: 0.75 mg/dL (ref 0.57–1.00)
Globulin, Total: 2.5 g/dL (ref 1.5–4.5)
Glucose: 176 mg/dL — ABNORMAL HIGH (ref 70–99)
Potassium: 4.1 mmol/L (ref 3.5–5.2)
Sodium: 138 mmol/L (ref 134–144)
Total Protein: 6.6 g/dL (ref 6.0–8.5)
eGFR: 107 mL/min/{1.73_m2} (ref >59)

## 2023-06-17 LAB — LIPID PANEL
Chol/HDL Ratio: 3.9 ratio (ref 0.0–4.4)
Cholesterol, Total: 158 mg/dL (ref 100–199)
HDL: 41 mg/dL (ref >39)
LDL Chol Calc (NIH): 88 mg/dL (ref 0–99)
Triglycerides: 167 mg/dL — ABNORMAL HIGH (ref 0–149)
VLDL Cholesterol Cal: 29 mg/dL (ref 5–40)

## 2023-06-17 LAB — HEMOGLOBIN A1C
Est. average glucose Bld gHb Est-mCnc: 128 mg/dL
Hgb A1c MFr Bld: 6.1 % — ABNORMAL HIGH (ref 4.8–5.6)

## 2023-06-23 ENCOUNTER — Encounter: Payer: Self-pay | Admitting: Family Medicine

## 2023-06-23 ENCOUNTER — Ambulatory Visit: Payer: Managed Care, Other (non HMO) | Admitting: Family Medicine

## 2023-06-23 VITALS — BP 142/96 | HR 82 | Resp 18 | Ht 59.0 in | Wt 194.0 lb

## 2023-06-23 DIAGNOSIS — E782 Mixed hyperlipidemia: Secondary | ICD-10-CM | POA: Diagnosis not present

## 2023-06-23 DIAGNOSIS — R7303 Prediabetes: Secondary | ICD-10-CM | POA: Diagnosis not present

## 2023-06-23 DIAGNOSIS — F330 Major depressive disorder, recurrent, mild: Secondary | ICD-10-CM | POA: Diagnosis not present

## 2023-06-23 DIAGNOSIS — I10 Essential (primary) hypertension: Secondary | ICD-10-CM

## 2023-06-23 MED ORDER — ESCITALOPRAM OXALATE 10 MG PO TABS
10.0000 mg | ORAL_TABLET | Freq: Every day | ORAL | 3 refills | Status: DC
Start: 2023-06-23 — End: 2023-12-28

## 2023-06-23 MED ORDER — VALSARTAN 80 MG PO TABS
80.0000 mg | ORAL_TABLET | Freq: Every day | ORAL | 3 refills | Status: DC
Start: 2023-06-23 — End: 2023-12-28

## 2023-06-23 MED ORDER — ROSUVASTATIN CALCIUM 5 MG PO TABS
5.0000 mg | ORAL_TABLET | Freq: Every day | ORAL | 1 refills | Status: DC
Start: 2023-06-23 — End: 2023-12-28

## 2023-06-23 NOTE — Assessment & Plan Note (Signed)
Last lipid panel: LDL 88, HDL 41, triglycerides 167.  Continue low-fat diet and routine daily activity is much as she can fit into her schedule.  Continue rosuvastatin 5 mg daily, will continue to monitor.

## 2023-06-23 NOTE — Assessment & Plan Note (Signed)
PHQ-9 score 2, stable.  Continue Lexapro 10 mg daily.  Will continue to monitor.

## 2023-06-23 NOTE — Assessment & Plan Note (Signed)
Most recent A1c 6.1.  We discussed that this is still within the prediabetes category and we will need to continue to monitor it.  Continue limiting carbs/sugar and pairing with protein, fiber, and healthy fats.  Will continue to monitor and check in about 3-4 months.

## 2023-06-23 NOTE — Progress Notes (Signed)
Established Patient Office Visit  Subjective   Patient ID: Kim Miles, female    DOB: September 26, 1991  Age: 32 y.o. MRN: [id number]  Chief Complaint  Patient presents with   Hyperlipidemia   Prediabetes   Hypertension    HPI Kim Miles is a 32 y.o. female presenting today for follow up of hypertension, hyperlipidemia, prediabetes. Hypertension: Patient here for follow-up of elevated blood pressure. She is staying active as much as she can, but her schedule is limited and has distant.  She is adherent to low salt diet.   Pt denies chest pain, SOB, dizziness, edema, syncope, fatigue or heart palpitations. Taking labetalol, reports excellent compliance with treatment.  After starting the twice a day regimen at last appointment, she found that the morning dose caused significant joint and muscle aches that affected her ability to go about her daily activities.  She has returned to taking labetalol 100 mg only at night, but she does note that blood pressure at home tends to be in the upper 130s-140s/90s. Hyperlipidemia: tolerating rosuvastatin well with no myalgias or significant side effects. Currently consuming a low fat diet.  The ASCVD Risk score (Arnett DK, et al., 2019) failed to calculate for the following reasons:   The 2019 ASCVD risk score is only valid for ages 14 to 51 Prediabetes: denies hypoglycemic events, wounds or sores that are not healing well, increased thirst or urination.  Mood: Patient is here to follow up for depression, currently managing with Lexapro. Taking medication without side effects, reports excellent compliance with treatment. Denies mood changes or SI/HI. She feels mood is stable since last visit.     06/23/2023    1:17 PM 03/23/2023   11:09 AM 02/03/2023   10:53 AM  Depression screen PHQ 2/9  Decreased Interest 0 1 0  Down, Depressed, Hopeless 0 1 0  PHQ - 2 Score 0 2 0  Altered sleeping 1 1 0  Tired, decreased energy 1 2 1   Change in appetite  0 0 0  Feeling bad or failure about yourself  0 1 0  Trouble concentrating 0 0 0  Moving slowly or fidgety/restless 0 0 0  Suicidal thoughts 0 0 0  PHQ-9 Score 2 6 1   Difficult doing work/chores Somewhat difficult Very difficult        06/23/2023    1:17 PM 01/06/2022   10:33 AM 06/11/2021   10:58 AM 05/27/2021    8:08 AM  GAD 7 : Generalized Anxiety Score  Nervous, Anxious, on Edge 0 0 1 0  Control/stop worrying 0 0 1 0  Worry too much - different things 0 0 1 0  Trouble relaxing 0 0 0 0  Restless 0 0 0 0  Easily annoyed or irritable 0 0 0 0  Afraid - awful might happen 0 0 0 0  Total GAD 7 Score 0 0 3 0  Anxiety Difficulty Not difficult at all  Not difficult at all Not difficult at all   ROS Negative unless otherwise noted in HPI   Objective:     BP (!) 142/96   Pulse 82   Resp 18   Ht 4\' 11"  (1.499 m)   Wt 194 lb (88 kg)   SpO2 97%   BMI 39.18 kg/m   Physical Exam Constitutional:      General: She is not in acute distress.    Appearance: Normal appearance.  HENT:     Head: Normocephalic and atraumatic.  Cardiovascular:  Rate and Rhythm: Normal rate and regular rhythm.     Heart sounds: No murmur heard.    No friction rub. No gallop.  Pulmonary:     Effort: Pulmonary effort is normal. No respiratory distress.     Breath sounds: No wheezing, rhonchi or rales.  Skin:    General: Skin is warm and dry.  Neurological:     Mental Status: She is alert and oriented to person, place, and time.     Assessment & Plan:  Essential hypertension Assessment & Plan: BP goal <140/90.  With blood pressure consistently above goal at home as well as in the office, we discussed that a change would be beneficial to protect her in the long-term.  Since she is not able to tolerate twice daily dosing of labetalol, we discussed that we could adjust the dosage or try a different medication.  She is open to a trial of valsartan.  Start valsartan 80 mg daily, discontinue labetalol.   We will follow-up in about 2 months, encouraged ambulatory blood pressure monitoring until then.  She is also aware that she can send me a message on MyChart if she experiences any side effects prior to our appointment.  Orders: -     Valsartan; Take 1 tablet (80 mg total) by mouth daily.  Dispense: 90 tablet; Refill: 3  Mixed hyperlipidemia Assessment & Plan: Last lipid panel: LDL 88, HDL 41, triglycerides 167.  Continue low-fat diet and routine daily activity is much as she can fit into her schedule.  Continue rosuvastatin 5 mg daily, will continue to monitor.  Orders: -     Rosuvastatin Calcium; Take 1 tablet (5 mg total) by mouth daily.  Dispense: 90 tablet; Refill: 1  Prediabetes Assessment & Plan: Most recent A1c 6.1.  We discussed that this is still within the prediabetes category and we will need to continue to monitor it.  Continue limiting carbs/sugar and pairing with protein, fiber, and healthy fats.  Will continue to monitor and check in about 3-4 months.   Mild episode of recurrent major depressive disorder (HCC) Assessment & Plan: PHQ-9 score 2, stable.  Continue Lexapro 10 mg daily.  Will continue to monitor.  Orders: -     Escitalopram Oxalate; Take 1 tablet (10 mg total) by mouth at bedtime.  Dispense: 90 tablet; Refill: 3    Return in about 2 months (around 08/23/2023) for follow-up for HTN and new medicine valsartan.    Melida Quitter, PA

## 2023-06-23 NOTE — Assessment & Plan Note (Signed)
BP goal <140/90.  With blood pressure consistently above goal at home as well as in the office, we discussed that a change would be beneficial to protect her in the long-term.  Since she is not able to tolerate twice daily dosing of labetalol, we discussed that we could adjust the dosage or try a different medication.  She is open to a trial of valsartan.  Start valsartan 80 mg daily, discontinue labetalol.  We will follow-up in about 2 months, encouraged ambulatory blood pressure monitoring until then.  She is also aware that she can send me a message on MyChart if she experiences any side effects prior to our appointment.

## 2023-08-23 ENCOUNTER — Encounter: Payer: Self-pay | Admitting: Family Medicine

## 2023-08-23 ENCOUNTER — Ambulatory Visit (INDEPENDENT_AMBULATORY_CARE_PROVIDER_SITE_OTHER): Payer: Managed Care, Other (non HMO) | Admitting: Family Medicine

## 2023-08-23 VITALS — BP 122/81 | HR 92 | Resp 18 | Ht 59.0 in | Wt 192.0 lb

## 2023-08-23 DIAGNOSIS — E782 Mixed hyperlipidemia: Secondary | ICD-10-CM | POA: Diagnosis not present

## 2023-08-23 DIAGNOSIS — F330 Major depressive disorder, recurrent, mild: Secondary | ICD-10-CM | POA: Diagnosis not present

## 2023-08-23 DIAGNOSIS — R7303 Prediabetes: Secondary | ICD-10-CM | POA: Diagnosis not present

## 2023-08-23 DIAGNOSIS — I10 Essential (primary) hypertension: Secondary | ICD-10-CM

## 2023-08-23 DIAGNOSIS — Z6838 Body mass index (BMI) 38.0-38.9, adult: Secondary | ICD-10-CM

## 2023-08-23 NOTE — Assessment & Plan Note (Signed)
Increases in depression and anxiety due to recent increase in stress.  Discussed that she may continue with Lexapro 10 mg daily, or she may increase up to 20 mg daily even if it is for a short time.  Patient verbalized understanding, stated that she will likely try increasing the dose to 20 mg daily.  Will continue to monitor.

## 2023-08-23 NOTE — Patient Instructions (Signed)
You are doing AMAZING! I am so excited that your blood pressure looks so good :)  If you need to, it is safe to take up to 20 mg (2 tablets) of Lexapro each day, even if it is for a short time. You can either go straight to 2 tablets a day, or you can cut the tablets in half and start with 15 mg (1.5 tablets) for a few weeks to start. I hope that it is helpful while you are getting through the increased stress!

## 2023-08-23 NOTE — Progress Notes (Signed)
Established Patient Office Visit  Subjective   Patient ID: Kim Miles, female    DOB: 20-Aug-1991  Age: 32 y.o. MRN: [id number]  Chief Complaint  Patient presents with   Hypertension    HPI Kim Miles is a 32 y.o. female presenting today for follow up of hypertension.  Started valsartan 80 mg daily at last appointment, patient has been taking as prescribed with no side effects. Pt denies chest pain, SOB, dizziness, edema, syncope, fatigue or heart palpitations. She is exercising as much as she can with her schedule, and is adherent to low salt diet.  She notes that she has been under increased stress recently as they are selling their house and she had to surrender her pets.  She is not sure if this is situational or if she would benefit from a higher dose of Lexapro.  Outpatient Medications Prior to Visit  Medication Sig   escitalopram (LEXAPRO) 10 MG tablet Take 1 tablet (10 mg total) by mouth at bedtime.   rosuvastatin (CRESTOR) 5 MG tablet Take 1 tablet (5 mg total) by mouth daily.   valsartan (DIOVAN) 80 MG tablet Take 1 tablet (80 mg total) by mouth daily.   No facility-administered medications prior to visit.    ROS Negative unless otherwise noted in HPI   Objective:     BP 122/81 (BP Location: Left Arm, Patient Position: Sitting, Cuff Size: Normal)   Pulse 92   Resp 18   Ht 4\' 11"  (1.499 m)   Wt 192 lb (87.1 kg)   SpO2 99%   Breastfeeding No   BMI 38.78 kg/m   Physical Exam Constitutional:      General: She is not in acute distress.    Appearance: Normal appearance.  HENT:     Head: Normocephalic and atraumatic.  Cardiovascular:     Rate and Rhythm: Normal rate and regular rhythm.     Heart sounds: No murmur heard.    No friction rub. No gallop.  Pulmonary:     Effort: Pulmonary effort is normal. No respiratory distress.     Breath sounds: No wheezing, rhonchi or rales.  Skin:    General: Skin is warm and dry.  Neurological:     Mental  Status: She is alert and oriented to person, place, and time.       08/23/2023    2:51 PM 06/23/2023    1:17 PM 03/23/2023   11:09 AM  Depression screen PHQ 2/9  Decreased Interest 0 0 1  Down, Depressed, Hopeless 2 0 1  PHQ - 2 Score 2 0 2  Altered sleeping 0 1 1  Tired, decreased energy 1 1 2   Change in appetite 0 0 0  Feeling bad or failure about yourself  0 0 1  Trouble concentrating 1 0 0  Moving slowly or fidgety/restless 0 0 0  Suicidal thoughts 1 0 0  PHQ-9 Score 5 2 6   Difficult doing work/chores Somewhat difficult Somewhat difficult Very difficult      08/23/2023    2:52 PM 06/23/2023    1:17 PM 01/06/2022   10:33 AM 06/11/2021   10:58 AM  GAD 7 : Generalized Anxiety Score  Nervous, Anxious, on Edge 3 0 0 1  Control/stop worrying 3 0 0 1  Worry too much - different things 3 0 0 1  Trouble relaxing 1 0 0 0  Restless 0 0 0 0  Easily annoyed or irritable 2 0 0 0  Afraid - awful  might happen 0 0 0 0  Total GAD 7 Score 12 0 0 3  Anxiety Difficulty Somewhat difficult Not difficult at all  Not difficult at all     Assessment & Plan:  Essential hypertension Assessment & Plan: BP goal <140/90.  Now at goal with addition of valsartan.  Continue valsartan 80 mg daily.  Will continue to monitor.   Mild episode of recurrent major depressive disorder (HCC) Assessment & Plan: Increases in depression and anxiety due to recent increase in stress.  Discussed that she may continue with Lexapro 10 mg daily, or she may increase up to 20 mg daily even if it is for a short time.  Patient verbalized understanding, stated that she will likely try increasing the dose to 20 mg daily.  Will continue to monitor.     Return in about 4 months (around 12/24/2023) for annual physical, fasting blood work 1 week before.    Melida Quitter, PA

## 2023-08-23 NOTE — Assessment & Plan Note (Signed)
BP goal <140/90.  Now at goal with addition of valsartan.  Continue valsartan 80 mg daily.  Will continue to monitor.

## 2023-12-02 ENCOUNTER — Encounter: Payer: Self-pay | Admitting: Family Medicine

## 2023-12-20 ENCOUNTER — Other Ambulatory Visit: Payer: Managed Care, Other (non HMO)

## 2023-12-20 DIAGNOSIS — Z6838 Body mass index (BMI) 38.0-38.9, adult: Secondary | ICD-10-CM

## 2023-12-20 DIAGNOSIS — R7303 Prediabetes: Secondary | ICD-10-CM

## 2023-12-20 DIAGNOSIS — I10 Essential (primary) hypertension: Secondary | ICD-10-CM

## 2023-12-20 DIAGNOSIS — E782 Mixed hyperlipidemia: Secondary | ICD-10-CM

## 2023-12-21 ENCOUNTER — Encounter: Payer: Self-pay | Admitting: Family Medicine

## 2023-12-21 LAB — VITAMIN D 25 HYDROXY (VIT D DEFICIENCY, FRACTURES): Vit D, 25-Hydroxy: 17.4 ng/mL — ABNORMAL LOW (ref 30.0–100.0)

## 2023-12-21 LAB — COMPREHENSIVE METABOLIC PANEL
ALT: 10 [IU]/L (ref 0–32)
AST: 21 [IU]/L (ref 0–40)
Albumin: 4.1 g/dL (ref 3.9–4.9)
Alkaline Phosphatase: 94 [IU]/L (ref 44–121)
BUN/Creatinine Ratio: 16 (ref 9–23)
BUN: 9 mg/dL (ref 6–20)
Bilirubin Total: 0.3 mg/dL (ref 0.0–1.2)
CO2: 21 mmol/L (ref 20–29)
Calcium: 9.1 mg/dL (ref 8.7–10.2)
Chloride: 103 mmol/L (ref 96–106)
Creatinine, Ser: 0.57 mg/dL (ref 0.57–1.00)
Globulin, Total: 2.6 g/dL (ref 1.5–4.5)
Glucose: 115 mg/dL — ABNORMAL HIGH (ref 70–99)
Potassium: 4.3 mmol/L (ref 3.5–5.2)
Sodium: 142 mmol/L (ref 134–144)
Total Protein: 6.7 g/dL (ref 6.0–8.5)
eGFR: 122 mL/min/{1.73_m2} (ref >59)

## 2023-12-21 LAB — CBC WITH DIFFERENTIAL/PLATELET
Basophils Absolute: 0.1 10*3/uL (ref 0.0–0.2)
Basos: 1 %
EOS (ABSOLUTE): 0.2 10*3/uL (ref 0.0–0.4)
Eos: 2 %
Hematocrit: 40.9 % (ref 34.0–46.6)
Hemoglobin: 13.3 g/dL (ref 11.1–15.9)
Immature Grans (Abs): 0 10*3/uL (ref 0.0–0.1)
Immature Granulocytes: 0 %
Lymphocytes Absolute: 2.6 10*3/uL (ref 0.7–3.1)
Lymphs: 28 %
MCH: 29.4 pg (ref 26.6–33.0)
MCHC: 32.5 g/dL (ref 31.5–35.7)
MCV: 91 fL (ref 79–97)
Monocytes Absolute: 0.7 10*3/uL (ref 0.1–0.9)
Monocytes: 8 %
Neutrophils Absolute: 5.9 10*3/uL (ref 1.4–7.0)
Neutrophils: 61 %
Platelets: 292 10*3/uL (ref 150–450)
RBC: 4.52 x10E6/uL (ref 3.77–5.28)
RDW: 12.5 % (ref 11.7–15.4)
WBC: 9.4 10*3/uL (ref 3.4–10.8)

## 2023-12-21 LAB — LIPID PANEL
Chol/HDL Ratio: 3.8 {ratio} (ref 0.0–4.4)
Cholesterol, Total: 150 mg/dL (ref 100–199)
HDL: 39 mg/dL — ABNORMAL LOW (ref >39)
LDL Chol Calc (NIH): 89 mg/dL (ref 0–99)
Triglycerides: 122 mg/dL (ref 0–149)
VLDL Cholesterol Cal: 22 mg/dL (ref 5–40)

## 2023-12-21 LAB — TSH RFX ON ABNORMAL TO FREE T4: TSH: 0.738 u[IU]/mL (ref 0.450–4.500)

## 2023-12-21 LAB — HEMOGLOBIN A1C
Est. average glucose Bld gHb Est-mCnc: 128 mg/dL
Hgb A1c MFr Bld: 6.1 % — ABNORMAL HIGH (ref 4.8–5.6)

## 2023-12-28 ENCOUNTER — Ambulatory Visit (INDEPENDENT_AMBULATORY_CARE_PROVIDER_SITE_OTHER): Payer: Managed Care, Other (non HMO) | Admitting: Family Medicine

## 2023-12-28 ENCOUNTER — Encounter: Payer: Self-pay | Admitting: Family Medicine

## 2023-12-28 VITALS — BP 130/89 | HR 86 | Ht 59.0 in | Wt 191.0 lb

## 2023-12-28 DIAGNOSIS — I10 Essential (primary) hypertension: Secondary | ICD-10-CM | POA: Diagnosis not present

## 2023-12-28 DIAGNOSIS — E559 Vitamin D deficiency, unspecified: Secondary | ICD-10-CM

## 2023-12-28 DIAGNOSIS — E782 Mixed hyperlipidemia: Secondary | ICD-10-CM

## 2023-12-28 DIAGNOSIS — Z0000 Encounter for general adult medical examination without abnormal findings: Secondary | ICD-10-CM | POA: Diagnosis not present

## 2023-12-28 DIAGNOSIS — F330 Major depressive disorder, recurrent, mild: Secondary | ICD-10-CM

## 2023-12-28 MED ORDER — ROSUVASTATIN CALCIUM 5 MG PO TABS: 5.0000 mg | ORAL_TABLET | Freq: Every day | ORAL | 1 refills | Status: DC

## 2023-12-28 MED ORDER — VITAMIN D (ERGOCALCIFEROL) 1.25 MG (50000 UNIT) PO CAPS: 50000.0000 [IU] | ORAL_CAPSULE | ORAL | 0 refills | Status: AC

## 2023-12-28 MED ORDER — ESCITALOPRAM OXALATE 10 MG PO TABS: 10.0000 mg | ORAL_TABLET | Freq: Every day | ORAL | 1 refills | Status: DC

## 2023-12-28 MED ORDER — ROSUVASTATIN CALCIUM 5 MG PO TABS
5.0000 mg | ORAL_TABLET | Freq: Every day | ORAL | 1 refills | Status: DC
Start: 2023-12-28 — End: 2023-12-28

## 2023-12-28 MED ORDER — VALSARTAN 80 MG PO TABS: 80.0000 mg | ORAL_TABLET | Freq: Every day | ORAL | 1 refills | Status: DC

## 2023-12-28 NOTE — Progress Notes (Signed)
 Complete physical exam  Patient: Kim Miles   DOB: 03-12-1991   32 y.o. Female  MRN: [id number]  Subjective:    Chief Complaint  Patient presents with   Annual Exam    Kim Miles is a 33 y.o. female who presents today for a complete physical exam. She reports consuming a general diet.  She is able to take some time for herself and her hobbies and well being, she has a great support system.  She generally feels well. She reports sleeping well, her son is an excellent sleeper through the night and also takes a 4-hour nap during the day.  They have recently moved which has taken up most of her time recently, but they are getting settled now.  She does not have additional problems to discuss today.    Most recent fall risk assessment:    12/28/2023   10:01 AM  Fall Risk   Falls in the past year? 0  Number falls in past yr: 0  Injury with Fall? 0  Risk for fall due to : No Fall Risks  Follow up Falls evaluation completed     Most recent depression and anxiety screenings:    12/28/2023   10:01 AM 08/23/2023    2:51 PM  PHQ 2/9 Scores  PHQ - 2 Score 0 2  PHQ- 9 Score 1 5      12/28/2023   10:01 AM 08/23/2023    2:52 PM 06/23/2023    1:17 PM 01/06/2022   10:33 AM  GAD 7 : Generalized Anxiety Score  Nervous, Anxious, on Edge 0 3 0 0  Control/stop worrying 0 3 0 0  Worry too much - different things 0 3 0 0  Trouble relaxing 0 1 0 0  Restless 0 0 0 0  Easily annoyed or irritable 1 2 0 0  Afraid - awful might happen 0 0 0 0  Total GAD 7 Score 1 12 0 0  Anxiety Difficulty Not difficult at all Somewhat difficult Not difficult at all     Patient Active Problem List   Diagnosis Date Noted   Mixed hyperlipidemia 04/04/2023   BMI 38.0-38.9,adult 03/08/2023   Prediabetes 01/01/2020   Current mild episode of major depressive disorder (HCC) 01/01/2020   Essential hypertension 04/26/2019    Past Surgical History:  Procedure Laterality Date   CESAREAN SECTION N/A  05/18/2022   Procedure: CESAREAN SECTION;  Surgeon: Venora Maples, MD;  Location: [facility];  Service: Obstetrics;  Laterality: N/A;   DILATION AND EVACUATION N/A 06/10/2022   Procedure: DILATATION AND EVACUATION;  Surgeon: Catalina Antigua, MD;  Location: [facility];  Service: Gynecology;  Laterality: N/A;   Social History   Tobacco Use   Smoking status: Never    Passive exposure: Never   Smokeless tobacco: Never  Vaping Use   Vaping status: Never Used  Substance Use Topics   Alcohol use: Never   Drug use: Never   Family History  Problem Relation Age of Onset   Asthma Mother    Hypertension Father    Anemia Sister    Eating disorder Sister    Cancer Maternal Grandmother    Cancer Maternal Grandfather    Diabetes Maternal Grandfather    Alzheimer's disease Maternal Grandfather    Heart attack Paternal Grandmother    Arthritis Paternal Grandmother    No Known Allergies   Patient Care Team: Melida Quitter, PA as PCP - General (Family Medicine) Alvester Morin,  Isa Rankin, MD as PCP - OBGYN (Obstetrics and Gynecology)   Outpatient Medications Prior to Visit  Medication Sig   [DISCONTINUED] escitalopram (LEXAPRO) 10 MG tablet Take 1 tablet (10 mg total) by mouth at bedtime.   [DISCONTINUED] rosuvastatin (CRESTOR) 5 MG tablet Take 1 tablet (5 mg total) by mouth daily.   [DISCONTINUED] valsartan (DIOVAN) 80 MG tablet Take 1 tablet (80 mg total) by mouth daily.   No facility-administered medications prior to visit.    Review of Systems  Constitutional:  Negative for chills, fever and malaise/fatigue.  HENT:  Negative for congestion and hearing loss.   Eyes:  Negative for blurred vision and double vision.  Respiratory:  Negative for cough and shortness of breath.   Cardiovascular:  Negative for chest pain, palpitations and leg swelling.  Gastrointestinal:  Negative for abdominal pain, constipation, diarrhea and heartburn.  Genitourinary:  Negative for  frequency and urgency.  Musculoskeletal:  Negative for myalgias and neck pain.  Neurological:  Negative for headaches.  Endo/Heme/Allergies:  Negative for polydipsia.  Psychiatric/Behavioral:  Negative for depression. The patient is not nervous/anxious and does not have insomnia.       Objective:    BP 130/89   Pulse 86   Ht 4\' 11"  (1.499 m)   Wt 191 lb (86.6 kg)   LMP 12/20/2023 Comment: Menstrual cycles are regular but have been lighter since her pregnancy  SpO2 98%   BMI 38.58 kg/m    Physical Exam Constitutional:      General: She is not in acute distress.    Appearance: Normal appearance.  HENT:     Head: Normocephalic and atraumatic.     Right Ear: Tympanic membrane, ear canal and external ear normal.     Left Ear: Tympanic membrane, ear canal and external ear normal.     Nose: Nose normal.     Mouth/Throat:     Mouth: Mucous membranes are moist.     Pharynx: No oropharyngeal exudate or posterior oropharyngeal erythema.  Eyes:     Extraocular Movements: Extraocular movements intact.     Conjunctiva/sclera: Conjunctivae normal.     Pupils: Pupils are equal, round, and reactive to light.  Neck:     Thyroid: No thyroid mass, thyromegaly or thyroid tenderness.  Cardiovascular:     Rate and Rhythm: Normal rate and regular rhythm.     Heart sounds: Normal heart sounds. No murmur heard.    No friction rub. No gallop.  Pulmonary:     Effort: Pulmonary effort is normal. No respiratory distress.     Breath sounds: Normal breath sounds. No wheezing, rhonchi or rales.  Abdominal:     General: Abdomen is flat. Bowel sounds are normal. There is no distension.     Palpations: There is no mass.     Tenderness: There is no abdominal tenderness. There is no guarding.  Musculoskeletal:        General: Normal range of motion.     Cervical back: Normal range of motion and neck supple.  Lymphadenopathy:     Cervical: No cervical adenopathy.  Skin:    General: Skin is warm and  dry.  Neurological:     Mental Status: She is alert and oriented to person, place, and time.     Cranial Nerves: No cranial nerve deficit.     Motor: No weakness.     Deep Tendon Reflexes: Reflexes normal.  Psychiatric:        Mood and Affect: Mood normal.  Assessment & Plan:    Routine Health Maintenance and Physical Exam  Immunization History  Administered Date(s) Administered   Influenza-Unspecified 08/01/2021, 07/08/2023   PFIZER Comirnaty(Gray Top)Covid-19 Tri-Sucrose Vaccine 11/18/2020   PFIZER(Purple Top)SARS-COV-2 Vaccination 01/24/2020, 02/22/2020   Tdap 06/11/2021, 04/30/2022    Health Maintenance  Topic Date Due   COVID-19 Vaccine (4 - 2024-25 season) 06/27/2023   Cervical Cancer Screening (HPV/Pap Cotest)  03/05/2024   DTaP/Tdap/Td (3 - Td or Tdap) 04/30/2032   INFLUENZA VACCINE  Completed   Hepatitis C Screening  Completed   HIV Screening  Completed   HPV VACCINES  Aged Out    Reviewed most recent labs including CBC, CMP, lipid panel, A1C, TSH, and vitamin D. All within normal limits/stable from last check other than Vitamin D very low at 17.4 so start prescription strength vitamin D, HDL low at 39 but LDL at goal 89, A1c stable at 6.1. Pap smear due soon, patient plans to schedule with her OB/GYN.  Discussed health benefits of physical activity, and encouraged her to engage in regular exercise appropriate for her age and condition.  Wellness examination  Mixed hyperlipidemia -     Rosuvastatin Calcium; Take 1 tablet (5 mg total) by mouth daily.  Dispense: 90 tablet; Refill: 1  Vitamin D deficiency -     Vitamin D (Ergocalciferol); Take 1 capsule (50,000 Units total) by mouth every 7 (seven) days. After complete, switch to OTC vitamin D3 2000 unit daily supplement.  Dispense: 12 capsule; Refill: 0  Essential hypertension -     Valsartan; Take 1 tablet (80 mg total) by mouth daily.  Dispense: 90 tablet; Refill: 1  Mild episode of recurrent major  depressive disorder (HCC) -     Escitalopram Oxalate; Take 1 tablet (10 mg total) by mouth at bedtime.  Dispense: 90 tablet; Refill: 1    Return in about 6 months (around 06/29/2024) for follow-up for HTN, HLD, vit D deficiency, fasting labs 1 week before.  If blood pressure and labs remained stable after that, may consider annually at CPE appointment.     Melida Quitter, PA

## 2024-01-31 ENCOUNTER — Ambulatory Visit (INDEPENDENT_AMBULATORY_CARE_PROVIDER_SITE_OTHER): Admitting: Physician Assistant

## 2024-01-31 ENCOUNTER — Encounter: Payer: Self-pay | Admitting: Physician Assistant

## 2024-01-31 VITALS — BP 123/86 | HR 82 | Ht 59.0 in | Wt 193.6 lb

## 2024-01-31 DIAGNOSIS — F331 Major depressive disorder, recurrent, moderate: Secondary | ICD-10-CM

## 2024-01-31 NOTE — Progress Notes (Signed)
 New patient visit   Patient: Kim Miles   DOB: 05-30-1991   33 y.o. Female  MRN: [id number] Visit Date: 01/31/2024  Today's healthcare provider: Alfredia Ferguson, PA-C   Cc. New pt, anxiety/depression  Subjective    Kim Miles is a 33 y.o. female who presents today as a new patient to establish care.   Pt reports today concerned over worsening depression and anxiety. She reports it feels like a cycle, building anxiety that reaches a peak where she can lash out to her husband-- feels overwhelmed, angry. She reports this is often followed by episodes of depression-- not wanting to move, having thoughts of 'what is the point'. She denies any history or intent to hurt herself, denies any active SI.   She has been talking to a friend who is going to set her up with a counselor.  She reports she is consistent with taking lexapro 10 mg, denies any other psychiatry medication history.   Past Medical History:  Diagnosis Date   Depression    Gestational diabetes    Hepatitis C antibody test positive-RNA negative  01/06/2022   History of severe pre-eclampsia 05/05/2022   Hypertension    chronic ,  prior to pregnancy   Menstrual migraine without status migrainosus, not intractable 01/01/2020   well controlled with junel-fe - pt skipping placebo and withdrawal bleed - reviewed OCP, monitoring, SE/risk.   Pre-diabetes    Retained products of conception, postpartum    Severe preeclampsia, third trimester 04/2022   Past Surgical History:  Procedure Laterality Date   CESAREAN SECTION N/A 05/18/2022   Procedure: CESAREAN SECTION;  Surgeon: Venora Maples, MD;  Location: [facility];  Service: Obstetrics;  Laterality: N/A;   DILATION AND EVACUATION N/A 06/10/2022   Procedure: DILATATION AND EVACUATION;  Surgeon: Catalina Antigua, MD;  Location: [facility];  Service: Gynecology;  Laterality: N/A;   Family Status  Relation Name Status   Mother  Alive   Father   Alive   Sister  Alive   Brother  Alive   MGM  Deceased   MGF  Deceased   PGM  Other   PGF  Deceased  No partnership data on file   Family History  Problem Relation Age of Onset   Asthma Mother    Hypertension Father    Anemia Sister    Eating disorder Sister    Cancer Maternal Grandmother    Cancer Maternal Grandfather    Diabetes Maternal Grandfather    Alzheimer's disease Maternal Grandfather    Heart attack Paternal Grandmother    Arthritis Paternal Grandmother    Social History   Socioeconomic History   Marital status: Married    Spouse name: Opal Dinning   Number of children: 0   Years of education: 12   Highest education level: Master's degree (e.g., MA, MS, MEng, MEd, MSW, MBA)  Occupational History   Not on file  Tobacco Use   Smoking status: Never    Passive exposure: Never   Smokeless tobacco: Never  Vaping Use   Vaping status: Never Used  Substance and Sexual Activity   Alcohol use: Never   Drug use: Never   Sexual activity: Not Currently    Birth control/protection: None  Other Topics Concern   Not on file  Social History Narrative   Not on file   Social Drivers of Health   Financial Resource Strain: Low Risk  (12/27/2023)   Overall Financial Resource Strain (CARDIA)  Difficulty of Paying Living Expenses: Not hard at all  Food Insecurity: No Food Insecurity (12/27/2023)   Hunger Vital Sign    Worried About Running Out of Food in the Last Year: Never true    Ran Out of Food in the Last Year: Never true  Transportation Needs: No Transportation Needs (12/27/2023)   PRAPARE - Administrator, Civil Service (Medical): No    Lack of Transportation (Non-Medical): No  Physical Activity: Unknown (12/27/2023)   Exercise Vital Sign    Days of Exercise per Week: 0 days    Minutes of Exercise per Session: Not on file  Stress: Stress Concern Present (12/27/2023)   [employer] of Occupational Health - Occupational Stress Questionnaire    Feeling  of Stress : Rather much  Social Connections: Socially Integrated (12/27/2023)   Social Connection and Isolation Panel [NHANES]    Frequency of Communication with Friends and Family: More than three times a week    Frequency of Social Gatherings with Friends and Family: Once a week    Attends Religious Services: More than 4 times per year    Active Member of [employer] or Organizations: Yes    Attends Engineer, structural: More than 4 times per year    Marital Status: Married   Outpatient Medications Prior to Visit  Medication Sig   escitalopram (LEXAPRO) 10 MG tablet Take 20 mg by mouth daily.   rosuvastatin (CRESTOR) 5 MG tablet Take 1 tablet (5 mg total) by mouth daily.   valsartan (DIOVAN) 80 MG tablet Take 1 tablet (80 mg total) by mouth daily.   Vitamin D, Ergocalciferol, (DRISDOL) 1.25 MG (50000 UNIT) CAPS capsule Take 1 capsule (50,000 Units total) by mouth every 7 (seven) days. After complete, switch to OTC vitamin D3 2000 unit daily supplement.   [DISCONTINUED] escitalopram (LEXAPRO) 10 MG tablet Take 1 tablet (10 mg total) by mouth at bedtime.   No facility-administered medications prior to visit.   No Known Allergies  Immunization History  Administered Date(s) Administered   Influenza-Unspecified 08/01/2021, 07/08/2023   PFIZER Comirnaty(Gray Top)Covid-19 Tri-Sucrose Vaccine 11/18/2020   PFIZER(Purple Top)SARS-COV-2 Vaccination 01/24/2020, 02/22/2020   Tdap 06/11/2021, 04/30/2022    Health Maintenance  Topic Date Due   COVID-19 Vaccine (4 - 2024-25 season) 06/27/2023   Cervical Cancer Screening (HPV/Pap Cotest)  03/05/2024   INFLUENZA VACCINE  05/26/2024   DTaP/Tdap/Td (3 - Td or Tdap) 04/30/2032   Hepatitis C Screening  Completed   HIV Screening  Completed   HPV VACCINES  Aged Out    Patient Care Team: Alfredia Ferguson, PA-C as PCP - General (Physician Assistant) Federico Flake, MD as PCP - OBGYN (Obstetrics and Gynecology)  Review of Systems   Constitutional:  Negative for fatigue and fever.  Respiratory:  Negative for cough and shortness of breath.   Cardiovascular:  Negative for chest pain and leg swelling.  Gastrointestinal:  Negative for abdominal pain.  Neurological:  Negative for dizziness and headaches.  Psychiatric/Behavioral:  Positive for behavioral problems. The patient is nervous/anxious.         Objective    BP 123/86   Pulse 82   Ht 4\' 11"  (1.499 m)   Wt 193 lb 9.6 oz (87.8 kg)   LMP 01/19/2024 (Exact Date)   Breastfeeding No   BMI 39.10 kg/m     Physical Exam Constitutional:      General: She is awake.     Appearance: She is well-developed.  HENT:  Head: Normocephalic.  Eyes:     Conjunctiva/sclera: Conjunctivae normal.  Cardiovascular:     Rate and Rhythm: Normal rate and regular rhythm.     Heart sounds: Normal heart sounds.  Pulmonary:     Effort: Pulmonary effort is normal.     Breath sounds: Normal breath sounds.  Skin:    General: Skin is warm.  Neurological:     Mental Status: She is alert and oriented to person, place, and time.  Psychiatric:        Attention and Perception: Attention normal.        Mood and Affect: Mood normal.        Speech: Speech normal.        Behavior: Behavior is cooperative.    Depression Screen    01/31/2024    2:43 PM 12/28/2023   10:01 AM 08/23/2023    2:51 PM 06/23/2023    1:17 PM  PHQ 2/9 Scores  PHQ - 2 Score 2 0 2 0  PHQ- 9 Score 11 1 5 2    No results found for any visits on 01/31/24.  Assessment & Plan     Moderate episode of recurrent major depressive disorder [facility]) Assessment & Plan: Encouraged pt to see a counselor/therapist. If she needs a direct referral, we can place one. Increase lexapro to 20 mg daily.  F/b 4-6 weeks.     Return in about 4 weeks (around 02/28/2024) for hypertension.     Alfredia Ferguson, PA-C  [facility] at [facility] [phone] (phone) [phone] (fax)  [facility]

## 2024-01-31 NOTE — Assessment & Plan Note (Signed)
 Encouraged pt to see a counselor/therapist. If she needs a direct referral, we can place one. Increase lexapro to 20 mg daily.  F/b 4-6 weeks.

## 2024-02-28 NOTE — Progress Notes (Unsigned)
 Established patient visit   Patient: Kim Miles   DOB: [birth year]   33 y.o. Female  MRN: [id number] Visit Date: 03/01/2024  Today's healthcare provider: Trenton Frock, PA-C   Cc. Depression f/u   Subjective    Depression, Follow-up  She  was last seen for this around a month ago. Lexapro  was increased to 20 mg.  Discussed the use of AI scribe software for clinical note transcription with the patient, who gave verbal consent to proceed.  History of Present Illness   Kim Miles is a 33 year old female who presents for follow-up regarding her Lexapro  medication.  She reports significant improvement in symptoms since starting Lexapro , feeling more relaxed and better able to handle stressors. Her husband has also noticed a positive change in her demeanor. She transitioned from two 10 mg tablets to a single 20 mg tablet daily, typically at bedtime. Initially, she experienced significant fatigue, with difficulty getting out of bed and sleeping all day for the first three days. This side effect has improved over time.  She has joined a support group for mothers, which provides a network of people to talk to. The group is mostly virtual due to the members being stay-at-home moms.         03/01/2024    2:49 PM 01/31/2024    2:43 PM 12/28/2023   10:01 AM  Depression screen PHQ 2/9  Decreased Interest 0 0 0  Down, Depressed, Hopeless 0 2 0  PHQ - 2 Score 0 2 0  Altered sleeping 1 2 0  Tired, decreased energy 2 3 1   Change in appetite 0 0 0  Feeling bad or failure about yourself  0 2 0  Trouble concentrating 1 1 0  Moving slowly or fidgety/restless 0 0 0  Suicidal thoughts 0 1 0  PHQ-9 Score 4 11 1   Difficult doing work/chores Somewhat difficult Very difficult Somewhat difficult    -----------------------------------------------------------------------------------------   Medications: Outpatient Medications Prior to Visit  Medication Sig   rosuvastatin  (CRESTOR )  5 MG tablet Take 1 tablet (5 mg total) by mouth daily.   valsartan  (DIOVAN ) 80 MG tablet Take 1 tablet (80 mg total) by mouth daily.   Vitamin D , Ergocalciferol , (DRISDOL ) 1.25 MG (50000 UNIT) CAPS capsule Take 1 capsule (50,000 Units total) by mouth every 7 (seven) days. After complete, switch to OTC vitamin D3 2000 unit daily supplement.   [DISCONTINUED] escitalopram  (LEXAPRO ) 10 MG tablet Take 20 mg by mouth daily.   No facility-administered medications prior to visit.    Review of Systems  Constitutional:  Negative for fatigue and fever.  Respiratory:  Negative for cough and shortness of breath.   Cardiovascular:  Negative for chest pain and leg swelling.  Gastrointestinal:  Negative for abdominal pain.  Neurological:  Negative for dizziness and headaches.       Objective    BP 124/85   Pulse 91   Ht 4\' 11"  (1.499 m)   Wt 194 lb (88 kg)   LMP 01/19/2024 (Exact Date)   BMI 39.18 kg/m    Physical Exam Vitals reviewed.  Constitutional:      Appearance: She is not ill-appearing.  HENT:     Head: Normocephalic.  Eyes:     Conjunctiva/sclera: Conjunctivae normal.  Cardiovascular:     Rate and Rhythm: Normal rate.  Pulmonary:     Effort: Pulmonary effort is normal. No respiratory distress.  Neurological:     Mental Status: She is  alert and oriented to person, place, and time.  Psychiatric:        Mood and Affect: Mood normal.        Behavior: Behavior normal.     No results found for any visits on 03/01/24.  Assessment & Plan    Moderate episode of recurrent major depressive disorder (HCC) Assessment & Plan: Improved w/ lexapro  20 mg continue  Orders: -     Escitalopram  Oxalate; Take 1 tablet (20 mg total) by mouth daily.  Dispense: 90 tablet; Refill: 1     Return in about 3 months (around 06/01/2024), or if symptoms worsen or fail to improve, for f/b for fasting labs, repeat d/t predm, low vit d, depression f/u, depression.       Trenton Frock, PA-C  [facility] at [facility] [phone] (phone) [phone] (fax)  [facility]

## 2024-03-01 ENCOUNTER — Ambulatory Visit (INDEPENDENT_AMBULATORY_CARE_PROVIDER_SITE_OTHER): Admitting: Physician Assistant

## 2024-03-01 ENCOUNTER — Encounter: Payer: Self-pay | Admitting: Physician Assistant

## 2024-03-01 VITALS — BP 124/85 | HR 91 | Ht 59.0 in | Wt 194.0 lb

## 2024-03-01 DIAGNOSIS — F331 Major depressive disorder, recurrent, moderate: Secondary | ICD-10-CM

## 2024-03-01 MED ORDER — ESCITALOPRAM OXALATE 20 MG PO TABS: 20.0000 mg | ORAL_TABLET | Freq: Every day | ORAL | 1 refills | Status: DC

## 2024-03-01 NOTE — Assessment & Plan Note (Signed)
 Improved w/ lexapro  20 mg continue

## 2024-03-06 ENCOUNTER — Encounter: Payer: Self-pay | Admitting: Physician Assistant

## 2024-03-07 ENCOUNTER — Other Ambulatory Visit: Payer: Self-pay | Admitting: Physician Assistant

## 2024-03-07 DIAGNOSIS — L989 Disorder of the skin and subcutaneous tissue, unspecified: Secondary | ICD-10-CM

## 2024-04-25 ENCOUNTER — Other Ambulatory Visit (HOSPITAL_COMMUNITY)
Admission: RE | Admit: 2024-04-25 | Discharge: 2024-04-25 | Disposition: A | Discharge status: Home / Self Care | Source: Ambulatory Visit | Attending: Obstetrics & Gynecology | Admitting: Obstetrics & Gynecology

## 2024-04-25 ENCOUNTER — Ambulatory Visit: Admitting: Obstetrics & Gynecology

## 2024-04-25 ENCOUNTER — Encounter: Payer: Self-pay | Admitting: Obstetrics & Gynecology

## 2024-04-25 VITALS — BP 134/94 | HR 105 | Ht 59.0 in | Wt 199.0 lb

## 2024-04-25 DIAGNOSIS — Z01419 Encounter for gynecological examination (general) (routine) without abnormal findings: Secondary | ICD-10-CM

## 2024-04-25 DIAGNOSIS — Z1331 Encounter for screening for depression: Secondary | ICD-10-CM

## 2024-04-25 NOTE — Progress Notes (Signed)
 GYNECOLOGY ANNUAL PREVENTATIVE CARE ENCOUNTER NOTE  History:    Kim Miles is a 33 y.o. G15P0101 female here for a routine annual gynecologic exam.  Current complaints: none.   Denies abnormal vaginal bleeding, discharge, pelvic pain, problems with intercourse or other gynecologic concerns.  Gynecologic History Patient's last menstrual period was 04/10/2024 (approximate). Contraception: vasectomy Last Pap: 03/05/2021. Result was normal with negative HPV   Obstetric History OB History  Gravida Para Term Preterm AB Living  1 1  1  1   SAB IAB Ectopic Multiple Live Births     0 1    # Outcome Date GA Lbr Len/2nd Weight Sex Type Anes PTL Lv  1 Preterm 05/18/22 [redacted]w[redacted]d  3 lb 0.7 oz (1.38 kg) M CS-LTranv Spinal  LIV    Past Medical History:  Diagnosis Date   Depression    Gestational diabetes    Hepatitis C antibody test positive-RNA negative  01/06/2022   History of severe pre-eclampsia 05/05/2022   Hypertension    chronic ,  prior to pregnancy   Menstrual migraine without status migrainosus, not intractable 01/01/2020   well controlled with junel-fe - pt skipping placebo and withdrawal bleed - reviewed OCP, monitoring, SE/risk.   Pre-diabetes    Retained products of conception, postpartum    Severe preeclampsia, third trimester 04/2022    Past Surgical History:  Procedure Laterality Date   CESAREAN SECTION N/A 05/18/2022   Procedure: CESAREAN SECTION;  Surgeon: Lola Donnice HERO, MD;  Location: [facility];  Service: Obstetrics;  Laterality: N/A;   DILATION AND EVACUATION N/A 06/10/2022   Procedure: DILATATION AND EVACUATION;  Surgeon: Alger Gong, MD;  Location: [facility];  Service: Gynecology;  Laterality: N/A;    Current Outpatient Medications on File Prior to Visit  Medication Sig Dispense Refill   escitalopram  (LEXAPRO ) 20 MG tablet Take 1 tablet (20 mg total) by mouth daily. 90 tablet 1   rosuvastatin  (CRESTOR ) 5 MG tablet Take 1 tablet (5  mg total) by mouth daily. 90 tablet 1   valsartan  (DIOVAN ) 80 MG tablet Take 1 tablet (80 mg total) by mouth daily. 90 tablet 1   No current facility-administered medications on file prior to visit.    No Known Allergies  Social History:  reports that she has never smoked. She has never been exposed to tobacco smoke. She has never used smokeless tobacco. She reports that she does not drink alcohol and does not use drugs.  Family History  Problem Relation Age of Onset   Heart attack Paternal Grandmother    Arthritis Paternal Grandmother    Breast cancer Maternal Grandmother    Cancer Maternal Grandfather    Diabetes Maternal Grandfather    Alzheimer's disease Maternal Grandfather    Hypertension Father    Asthma Mother    Anemia Sister    Eating disorder Sister     The following portions of the patient's history were reviewed and updated as appropriate: allergies, current medications, past family history, past medical history, past social history, past surgical history and problem list.  Review of Systems Pertinent items noted in HPI and remainder of comprehensive ROS otherwise negative.  Physical Exam:  BP (!) 134/94   Pulse (!) 105   Ht 4' 11 (1.499 m)   Wt 199 lb (90.3 kg)   LMP 04/10/2024 (Approximate)   BMI 40.19 kg/m  CONSTITUTIONAL: Well-developed, well-nourished female in no acute distress.  HENT:  Normocephalic, atraumatic, External right and left ear normal.  EYES: Conjunctivae and EOM are normal. Pupils are equal, round, and reactive to light. No scleral icterus.  NECK: Normal range of motion, supple, no masses observed. SKIN: Skin is warm and dry. No rash noted. Not diaphoretic. No erythema. No pallor. MUSCULOSKELETAL: Normal range of motion. No tenderness.  No cyanosis, clubbing, or edema. NEUROLOGIC: Alert and oriented to person, place, and time. Normal muscle tone coordination.  PSYCHIATRIC: Normal mood and affect. Normal behavior. Normal judgment and  thought content. CARDIOVASCULAR: Normal heart rate noted, regular rhythm RESPIRATORY: Clear to auscultation bilaterally. Effort and breath sounds normal, no problems with respiration noted. BREASTS: Symmetric in size. No masses, tenderness, skin changes, nipple drainage, or lymphadenopathy bilaterally. Performed in the presence of a chaperone. ABDOMEN: Soft, no distention noted.  No tenderness, rebound or guarding.  PELVIC: Normal appearing external genitalia and urethral meatus; normal appearing vaginal mucosa and cervix.  No abnormal vaginal discharge noted.  Pap smear obtained.  Unable to palpate uterus or adnexa fully secondary to habitus, no uterine or adnexal tenderness.  Performed in the presence of a chaperone.  Assessment and Plan:    1. Well woman exam with routine gynecological exam (Primary) - Cytology - PAP Will follow up results of pap smear and manage accordingly. Normal breast examination today, she was advised to perform periodic self breast examinations.  Routine preventative health maintenance measures emphasized. Please refer to After Visit Summary for other counseling recommendations.      GLORIS HUGGER, MD, FACOG Obstetrician & Gynecologist, [facility] for [employer], [facility]

## 2024-05-01 NOTE — Progress Notes (Unsigned)
      Established patient visit   Patient: Kim Miles   DOB: 09-03-1991   33 y.o. Female  MRN: [id number] Visit Date: 05/02/2024  Today's healthcare provider: Manuelita Flatness, PA-C   No chief complaint on file.  Subjective     ***  Medications: Outpatient Medications Prior to Visit  Medication Sig   escitalopram  (LEXAPRO ) 20 MG tablet Take 1 tablet (20 mg total) by mouth daily.   rosuvastatin  (CRESTOR ) 5 MG tablet Take 1 tablet (5 mg total) by mouth daily.   valsartan  (DIOVAN ) 80 MG tablet Take 1 tablet (80 mg total) by mouth daily.   No facility-administered medications prior to visit.    Review of Systems {Insert previous labs (optional):23779} {See past labs  Heme  Chem  Endocrine  Serology  Results Review (optional):1}   Objective    LMP 04/10/2024 (Approximate)  {Insert last BP/Wt (optional):23777}{See vitals history (optional):1}  Physical Exam  ***  No results found for any visits on 05/02/24.  Assessment & Plan    There are no diagnoses linked to this encounter.  ***  No follow-ups on file.       Manuelita Flatness, PA-C  [facility] at [facility] [phone] (phone) [phone] (fax)  [facility]

## 2024-05-02 ENCOUNTER — Encounter: Payer: Self-pay | Admitting: Physician Assistant

## 2024-05-02 ENCOUNTER — Ambulatory Visit: Payer: Self-pay | Admitting: Obstetrics & Gynecology

## 2024-05-02 ENCOUNTER — Ambulatory Visit: Admitting: Physician Assistant

## 2024-05-02 VITALS — BP 122/80 | HR 83 | Ht 59.0 in | Wt 196.2 lb

## 2024-05-02 DIAGNOSIS — E669 Obesity, unspecified: Secondary | ICD-10-CM | POA: Diagnosis not present

## 2024-05-02 DIAGNOSIS — F331 Major depressive disorder, recurrent, moderate: Secondary | ICD-10-CM | POA: Diagnosis not present

## 2024-05-02 DIAGNOSIS — R87610 Atypical squamous cells of undetermined significance on cytologic smear of cervix (ASC-US): Secondary | ICD-10-CM | POA: Insufficient documentation

## 2024-05-02 DIAGNOSIS — I10 Essential (primary) hypertension: Secondary | ICD-10-CM

## 2024-05-02 LAB — CYTOLOGY - PAP
Comment: NEGATIVE
Diagnosis: UNDETERMINED — AB
High risk HPV: NEGATIVE

## 2024-05-02 NOTE — Assessment & Plan Note (Signed)
 Well controlled, cont valsartan  80 mg

## 2024-05-02 NOTE — Assessment & Plan Note (Signed)
 Still improved/ stable lexapro  20 mg

## 2024-05-02 NOTE — Assessment & Plan Note (Signed)
 Considered a good candidate for GLP-1, given preDM, Discussed potential side effects, including nausea and acid reflux, and emphasized the importance of a balanced diet with adequate protein intake.   Currently engages in regular exercise and maintains a balanced diet but struggles with weight loss.  - Advised pt to check insurance coverage for [facility] or Zepbound. - If insurance coverage is favorable, initiate GLP with the lowest dose once weekly for a month, then titrate based on response and side effects.

## 2024-05-03 ENCOUNTER — Telehealth: Payer: Self-pay | Admitting: *Deleted

## 2024-05-03 ENCOUNTER — Other Ambulatory Visit

## 2024-05-03 ENCOUNTER — Other Ambulatory Visit: Payer: Self-pay | Admitting: Physician Assistant

## 2024-05-03 DIAGNOSIS — R7303 Prediabetes: Secondary | ICD-10-CM

## 2024-05-03 DIAGNOSIS — E559 Vitamin D deficiency, unspecified: Secondary | ICD-10-CM

## 2024-05-03 NOTE — Telephone Encounter (Signed)
 Thank you :)

## 2024-05-03 NOTE — Telephone Encounter (Signed)
 Pt came in for lab appt.  We had no future orders in place and no clear note indicating labs pt is due for.  Pt has r/s for tomorrow morning.  Please place future orders if appropriate.  If not, please contact pt to cancel or r/s for appropriate time.

## 2024-05-04 ENCOUNTER — Other Ambulatory Visit (INDEPENDENT_AMBULATORY_CARE_PROVIDER_SITE_OTHER)

## 2024-05-04 ENCOUNTER — Ambulatory Visit: Payer: Self-pay | Admitting: Physician Assistant

## 2024-05-04 DIAGNOSIS — R7303 Prediabetes: Secondary | ICD-10-CM | POA: Diagnosis not present

## 2024-05-04 DIAGNOSIS — E559 Vitamin D deficiency, unspecified: Secondary | ICD-10-CM | POA: Diagnosis not present

## 2024-05-04 LAB — VITAMIN D 25 HYDROXY (VIT D DEFICIENCY, FRACTURES): VITD: 26.71 ng/mL — ABNORMAL LOW (ref 30.00–100.00)

## 2024-05-04 LAB — HEMOGLOBIN A1C: Hgb A1c MFr Bld: 6.3 % (ref 4.6–6.5)

## 2024-05-25 ENCOUNTER — Other Ambulatory Visit: Payer: Self-pay | Admitting: Family Medicine

## 2024-05-25 DIAGNOSIS — E782 Mixed hyperlipidemia: Secondary | ICD-10-CM

## 2024-05-25 DIAGNOSIS — I10 Essential (primary) hypertension: Secondary | ICD-10-CM

## 2024-06-13 ENCOUNTER — Ambulatory Visit (INDEPENDENT_AMBULATORY_CARE_PROVIDER_SITE_OTHER): Admitting: Physician Assistant

## 2024-06-13 ENCOUNTER — Encounter: Payer: Self-pay | Admitting: Physician Assistant

## 2024-06-13 VITALS — BP 123/82 | HR 95

## 2024-06-13 DIAGNOSIS — D485 Neoplasm of uncertain behavior of skin: Secondary | ICD-10-CM

## 2024-06-13 DIAGNOSIS — D2230 Melanocytic nevi of unspecified part of face: Secondary | ICD-10-CM | POA: Diagnosis not present

## 2024-06-13 DIAGNOSIS — D489 Neoplasm of uncertain behavior, unspecified: Secondary | ICD-10-CM

## 2024-06-13 NOTE — Patient Instructions (Signed)
 Important Information  Due to recent changes in healthcare laws, you may see results of your pathology and/or laboratory studies on MyChart before the doctors have had a chance to review them. We understand that in some cases there may be results that are confusing or concerning to you. Please understand that not all results are received at the same time and often the doctors may need to interpret multiple results in order to provide you with the best plan of care or course of treatment. Therefore, we ask that you please give [place] 2 business days to thoroughly review all your results before contacting the office for clarification. Should we see a critical lab result, you will be contacted sooner.   If You Need Anything After Your Visit  If you have any questions or concerns for your doctor, please call our main line at [phone] If no one answers, please leave a voicemail as directed and we will return your call as soon as possible. Messages left after 4 pm will be answered the following business day.   You may also send [place] a message via MyChart. We typically respond to MyChart messages within 1-2 business days.  For prescription refills, please ask your pharmacy to contact our office. Our fax number is [phone].  If you have an urgent issue when the clinic is closed that cannot wait until the next business day, you can page your doctor at the number below.    Please note that while we do our best to be available for urgent issues outside of office hours, we are not available 24/7.   If you have an urgent issue and are unable to reach [place], you may choose to seek medical care at your doctor's office, retail clinic, urgent care center, or emergency room.  If you have a medical emergency, please immediately call 911 or go to the emergency department. In the event of inclement weather, please call our main line at [phone] for an update on the status of any delays or  closures.  Dermatology Medication Tips: Please keep the boxes that topical medications come in in order to help keep track of the instructions about where and how to use these. Pharmacies typically print the medication instructions only on the boxes and not directly on the medication tubes.   If your medication is too expensive, please contact our office at [phone] or send [place] a message through MyChart.   We are unable to tell what your co-pay for medications will be in advance as this is different depending on your insurance coverage. However, we may be able to find a substitute medication at lower cost or fill out paperwork to get insurance to cover a needed medication.   If a prior authorization is required to get your medication covered by your insurance company, please allow [place] 1-2 business days to complete this process.  Drug prices often vary depending on where the prescription is filled and some pharmacies may offer cheaper prices.  The website www.goodrx.com contains coupons for medications through different pharmacies. The prices here do not account for what the cost may be with help from insurance (it may be cheaper with your insurance), but the website can give you the price if you did not use any insurance.  - You can print the associated coupon and take it with your prescription to the pharmacy.  - You may also stop by our office during regular business hours and pick up a GoodRx coupon card.  - If  you need your prescription sent electronically to a different pharmacy, notify our office through [facility] or by phone at [phone]     Patient Handout: Wound Care for Skin Biopsy Site  Taking Care of Your Skin Biopsy Site  Proper care of the biopsy site is essential for promoting healing and minimizing scarring. This handout provides instructions on how to care for your biopsy site to ensure optimal recovery.  1. Cleaning the Wound:  Clean the biopsy site daily  with gentle soap and water. Gently pat the area dry with a clean, soft towel. Avoid harsh scrubbing or rubbing the area, as this can irritate the skin and delay healing.  2. Applying Aquaphor and Bandage:  After cleaning the wound, apply a thin layer of Aquaphor ointment to the biopsy site. Cover the area with a sterile bandage to protect it from dirt, bacteria, and friction. Change the bandage daily or as needed if it becomes soiled or wet.  3. Continued Care for One Week:  Repeat the cleaning, Aquaphor application, and bandaging process daily for one week following the biopsy procedure. Keeping the wound clean and moist during this initial healing period will help prevent infection and promote optimal healing.  4. Massaging Aquaphor into the Area:  ---After one week, discontinue the use of bandages but continue to apply Aquaphor to the biopsy site. ----Gently massage the Aquaphor into the area using circular motions. ---Massaging the skin helps to promote circulation and prevent the formation of scar tissue.   Additional Tips:  Avoid exposing the biopsy site to direct sunlight during the healing process, as this can cause hyperpigmentation or worsen scarring. If you experience any signs of infection, such as increased redness, swelling, warmth, or drainage from the wound, contact your healthcare provider immediately. Follow any additional instructions provided by your healthcare provider for caring for the biopsy site and managing any discomfort. Conclusion:  Taking proper care of your skin biopsy site is crucial for ensuring optimal healing and minimizing scarring. By following these instructions for cleaning, applying Aquaphor, and massaging the area, you can promote a smooth and successful recovery. If you have any questions or concerns about caring for your biopsy site, don't hesitate to contact your healthcare provider for guidance.

## 2024-06-13 NOTE — Progress Notes (Signed)
   New Patient Visit   Subjective  Kim Miles is a 33 y.o. female NEW PATIENT who presents for the following: Pt states she has a mole under the right side of her chin, Pt states she would like for it to be removed. Pt denies any skin cancer history. Pt states she wears sun screen everyday baby spf 50.    The following portions of the chart were reviewed this encounter and updated as appropriate: medications, allergies, medical history  Review of Systems:  No other skin or systemic complaints except as noted in HPI or Assessment and Plan.  Objective  Well appearing patient in no apparent distress; mood and affect are within normal limits.  A focused examination was performed of the following areas: face  Relevant exam findings are noted in the Assessment and Plan.     Right Anterior Mandible 0.6 cm flesh colored papule     Assessment & Plan     NEOPLASM OF UNCERTAIN BEHAVIOR Right Anterior Mandible Skin / nail biopsy Type of biopsy: tangential   Informed consent: discussed and consent obtained   Timeout: patient name, date of birth, surgical site, and procedure verified   Procedure prep:  Patient was prepped and draped in usual sterile fashion Prep type:  Isopropyl alcohol Anesthesia: the lesion was anesthetized in a standard fashion   Anesthetic:  1% lidocaine  w/ epinephrine 1-100,000 buffered w/ 8.4% NaHCO3 Instrument used: flexible razor blade   Hemostasis achieved with: pressure and aluminum chloride   Outcome: patient tolerated procedure well   Post-procedure details: sterile dressing applied and wound care instructions given   Dressing type: bandage and petrolatum    Specimen 1 - Surgical pathology Differential Diagnosis: irritated nevus vs other  Check Margins: No  Return if symptoms worsen or fail to improve.  I, Doyce Pan, CMA, am acting as scribe for Abel Ra K, PA-C.   Documentation: I have reviewed the above documentation for  accuracy and completeness, and I agree with the above.  Bernabe Dorce K, PA-C

## 2024-06-14 ENCOUNTER — Ambulatory Visit: Payer: Self-pay | Admitting: Physician Assistant

## 2024-06-14 LAB — SURGICAL PATHOLOGY

## 2024-06-28 ENCOUNTER — Other Ambulatory Visit: Payer: Self-pay | Admitting: Family Medicine

## 2024-06-28 ENCOUNTER — Other Ambulatory Visit: Payer: Self-pay | Admitting: *Deleted

## 2024-06-28 DIAGNOSIS — I10 Essential (primary) hypertension: Secondary | ICD-10-CM

## 2024-06-28 MED ORDER — VALSARTAN 80 MG PO TABS: 80.0000 mg | ORAL_TABLET | Freq: Every day | ORAL | 0 refills | Status: DC

## 2024-07-17 ENCOUNTER — Ambulatory Visit (INDEPENDENT_AMBULATORY_CARE_PROVIDER_SITE_OTHER): Admitting: Family Medicine

## 2024-07-17 ENCOUNTER — Encounter (INDEPENDENT_AMBULATORY_CARE_PROVIDER_SITE_OTHER): Payer: Self-pay | Admitting: Family Medicine

## 2024-07-17 VITALS — BP 108/76 | HR 92 | Temp 97.9°F | Ht 59.5 in | Wt 194.0 lb

## 2024-07-17 DIAGNOSIS — Z0289 Encounter for other administrative examinations: Secondary | ICD-10-CM

## 2024-07-17 DIAGNOSIS — E785 Hyperlipidemia, unspecified: Secondary | ICD-10-CM

## 2024-07-17 DIAGNOSIS — Z6838 Body mass index (BMI) 38.0-38.9, adult: Secondary | ICD-10-CM

## 2024-07-17 DIAGNOSIS — R7303 Prediabetes: Secondary | ICD-10-CM | POA: Diagnosis not present

## 2024-07-17 DIAGNOSIS — R5383 Other fatigue: Secondary | ICD-10-CM | POA: Diagnosis not present

## 2024-07-17 DIAGNOSIS — E782 Mixed hyperlipidemia: Secondary | ICD-10-CM

## 2024-07-17 DIAGNOSIS — I10 Essential (primary) hypertension: Secondary | ICD-10-CM

## 2024-07-17 DIAGNOSIS — E669 Obesity, unspecified: Secondary | ICD-10-CM

## 2024-07-17 NOTE — Progress Notes (Signed)
 Office: [phone]  /  Fax: [phone]  WEIGHT SUMMARY AND BIOMETRICS  Anthropometric Measurements Height: 4' 11.5 (1.511 m) Weight: 194 lb (88 kg) BMI (Calculated): 38.54 Peak Weight: 194 lb   Body Composition  Body Fat %: 39.3 % Fat Mass (lbs): 76.2 lbs Muscle Mass (lbs): 111.8 lbs Total Body Water  (lbs): 75 lbs Visceral Fat Rating : 9   Other Clinical Data Fasting: no Labs: no Today's Visit #: Info Session Comments: Info Session    Chief Complaint: OBESITY   History of Present Illness Kim Miles is a 33 year old female with obesity who presents for a consultation regarding her obesity and related conditions. Her husband and two-year-old son were with her initially but had to leave after the first few minutes.  She is seeking consultation for obesity and related conditions, including prediabetes, hypertension, and hyperlipidemia. Her current weight is 194 pounds, with a BMI of 38.5, a visceral fat rating of 9, and a body fat percentage of 39.3.  Her blood pressure is controlled at 108/76 mmHg with Diovan . She has a history of prediabetes, with a recent hemoglobin A1c of 6.3% measured two months ago. Her LDL cholesterol has improved from 189 mg/dL to 89 mg/dL, and she is currently taking Crestor  5 mg for hyperlipidemia.  She experiences fatigue. She wants to focus on lifestyle modifications, particularly 'real food eating' and meal planning to avoid frequent dining out.  Her weight has been slowly increasing and she has recently started having health problems which are likely to be at least somewhat related to her weight gain. She notes eating out frequently due to time and convenience. She is ready to start working on healthier lifestyles and is in the action stage of change.      PHYSICAL EXAM:  Blood pressure 108/76, pulse 92, temperature 97.9 F (36.6 C), height 4' 11.5 (1.511 m), weight 194 lb (88 kg), SpO2 98%. Body mass index is 38.53  kg/m.  DIAGNOSTIC DATA REVIEWED:  BMET    Component Value Date/Time   NA 142 12/20/2023 0849   K 4.3 12/20/2023 0849   CL 103 12/20/2023 0849   CO2 21 12/20/2023 0849   GLUCOSE 115 (H) 12/20/2023 0849   GLUCOSE 113 (H) 06/10/2022 0747   BUN 9 12/20/2023 0849   CREATININE 0.57 12/20/2023 0849   CREATININE 0.64 06/11/2021 1127   CALCIUM  9.1 12/20/2023 0849   GFRNONAA >60 06/08/2022 0131   GFRNONAA 106 06/03/2020 1616   GFRAA 123 06/03/2020 1616   Lab Results  Component Value Date   HGBA1C 6.3 05/04/2024   HGBA1C 5.7 (H) 08/15/2019   No results found for: INSULIN  Lab Results  Component Value Date   TSH 0.738 12/20/2023   CBC    Component Value Date/Time   WBC 9.4 12/20/2023 0849   WBC 9.2 06/08/2022 0131   RBC 4.52 12/20/2023 0849   RBC 3.53 (L) 06/08/2022 0131   HGB 13.3 12/20/2023 0849   HCT 40.9 12/20/2023 0849   PLT 292 12/20/2023 0849   MCV 91 12/20/2023 0849   MCH 29.4 12/20/2023 0849   MCH 30.3 06/08/2022 0131   MCHC 32.5 12/20/2023 0849   MCHC 33.4 06/08/2022 0131   RDW 12.5 12/20/2023 0849   Iron Studies No results found for: IRON, TIBC, FERRITIN, IRONPCTSAT Lipid Panel     Component Value Date/Time   CHOL 150 12/20/2023 0849   TRIG 122 12/20/2023 0849   HDL 39 (L) 12/20/2023 0849   CHOLHDL 3.8 12/20/2023 0849  CHOLHDL 5.7 (H) 06/11/2021 1127   LDLCALC 89 12/20/2023 0849   LDLCALC 182 (H) 06/11/2021 1127   Hepatic Function Panel     Component Value Date/Time   PROT 6.7 12/20/2023 0849   ALBUMIN 4.1 12/20/2023 0849   AST 21 12/20/2023 0849   ALT 10 12/20/2023 0849   ALKPHOS 94 12/20/2023 0849   BILITOT 0.3 12/20/2023 0849      Component Value Date/Time   TSH 0.738 12/20/2023 0849   TSH 0.933 03/01/2023 0909   Nutritional Lab Results  Component Value Date   VD25OH 26.71 (L) 05/04/2024   VD25OH 17.4 (L) 12/20/2023     Assessment and Plan Assessment & Plan Obesity Obesity with a BMI of 38.5, peak weight of 194  pounds, and body fat percentage of 39.3. She is in the action stage of change and feels lifestyle modifications will be beneficial. Her husband appears supportive of lifestyle changes which will be helpful for her. - Schedule workup visit with fasting for 8 hours, avoiding exercise, nicotine, and caffeine for accurate test results. - Provide an eating plan at the initial visit and review it at the subsequent visit. - Discuss hunger, cravings, meal planning, and sabotage issues at the follow-up visit. - Discussed visits every 2 weeks for the first 6 visits and then less often after that.  Discussed billing as PCP but she will need to follow with her PCP as recommended, but that we would serve as a back-up for lifestyle related concerns.   Prediabetes Recent hemoglobin A1c was 6.3, indicating prediabetes. - Continue monitoring hemoglobin A1c and adjust treatment plan as needed. - Diet, exercise and weight loss as discussed today will be an important part of the treatment plan   Essential hypertension Blood pressure is well-controlled at 108/76 mmHg with Diovan . - Continue Diovan  for blood pressure control. - Monitor blood pressure regularly and adjust treatment as needed. - Diet, exercise and weight loss as discussed today will be an important part of the treatment plan Hyperlipidemia LDL cholesterol has improved from 189 to 89 with Crestor  5 mg. - Continue Crestor  5 mg for hyperlipidemia. - Monitor lipid levels regularly and adjust treatment as needed.  Fatigue (under evaluation) Fatigue is under evaluation and may or may not be related to weight. - Include fatigue in the workup for obesity and related conditions when she schedules her new patient visit..     I personally spent a total of 45 minutes in the care of the patient today including preparing to see the patient, documenting clinical information in the EMR, and explaining the pathophysiology of obesity and how it is  significantly more complex than eat less and exercise more.    Demecia was informed of the importance of frequent follow up visits to maximize her success with intensive lifestyle modifications for her obesity and obesity related health conditions as recommended by USPSTF and CMS guidelines   Louann Penton, MD

## 2024-08-15 ENCOUNTER — Ambulatory Visit (INDEPENDENT_AMBULATORY_CARE_PROVIDER_SITE_OTHER): Admitting: Family Medicine

## 2024-08-15 ENCOUNTER — Encounter (INDEPENDENT_AMBULATORY_CARE_PROVIDER_SITE_OTHER): Payer: Self-pay | Admitting: Family Medicine

## 2024-08-15 VITALS — BP 137/85 | HR 75 | Temp 98.0°F | Ht 59.5 in | Wt 194.0 lb

## 2024-08-15 DIAGNOSIS — F5089 Other specified eating disorder: Secondary | ICD-10-CM

## 2024-08-15 DIAGNOSIS — I10 Essential (primary) hypertension: Secondary | ICD-10-CM

## 2024-08-15 DIAGNOSIS — R0602 Shortness of breath: Secondary | ICD-10-CM

## 2024-08-15 DIAGNOSIS — R5383 Other fatigue: Secondary | ICD-10-CM | POA: Diagnosis not present

## 2024-08-15 DIAGNOSIS — Z1331 Encounter for screening for depression: Secondary | ICD-10-CM | POA: Diagnosis not present

## 2024-08-15 DIAGNOSIS — R7303 Prediabetes: Secondary | ICD-10-CM

## 2024-08-15 DIAGNOSIS — E782 Mixed hyperlipidemia: Secondary | ICD-10-CM

## 2024-08-15 DIAGNOSIS — E669 Obesity, unspecified: Secondary | ICD-10-CM

## 2024-08-15 DIAGNOSIS — Z6838 Body mass index (BMI) 38.0-38.9, adult: Secondary | ICD-10-CM

## 2024-08-15 DIAGNOSIS — F339 Major depressive disorder, recurrent, unspecified: Secondary | ICD-10-CM | POA: Diagnosis not present

## 2024-08-15 NOTE — Progress Notes (Signed)
 Kim Miles, D.O.  ABFM, AB[facility] Specializing in Clinical Bariatric Medicine Office located at: [address] [place], [place]  [address]   Bariatric Medicine Visit  Dear No ref. provider found   Thank you for referring Kim Miles to our clinic today for evaluation.  We performed a consultation to discuss her options for treatment and educate the patient on her disease state.  The following note includes my evaluation and treatment recommendations.   Please do not hesitate to reach out to me directly if you have any further concerns.    Assessment and Plan:   Orders Placed This Encounter  Procedures   Vitamin B12   Folate   Hemoglobin A1c   Insulin , random   VITAMIN D  25 Hydroxy (Vit-D Deficiency, Fractures)   TSH   T4, free   Lipid panel   Comprehensive metabolic panel with GFR   CBC with Differential/Platelet        FOR THE DISEASE OF OBESITY:  Obesity, beginning BMI 38.54 Obesity (BMI 30-39.9) current BMI 38.54 Assessment & Plan: Kim Miles is currently in the action stage of change. As such, her goal is to start our weight management plan.  She has agreed to implement: follow the Category 1 plan - 1000 kcal per Miles w lunch options and 8 oz of protein at night    Behavioral Intervention We discussed the following Behavioral Modification Strategies today: increasing lean protein intake to established goals, decreasing simple carbohydrates , avoiding skipping meals, increasing water  intake , decreasing eating out or consumption of processed foods, and making healthy choices when eating convenient foods, and discussed pre-packaged healthier meals such as [employer] All Natural, Whole 30 chicken meals, Longlife meal prep and Factor Meals etc  Additional resources provided today: Handout on CAT 1 meal plan  and Handout on CAT 1-2 lunch options, Cat 1 grocery list  Evidence-based interventions for health behavior change were utilized today including the  discussion of self monitoring techniques, problem-solving barriers and SMART goal setting techniques.    Goal(s) for next OV: weight lean proteins and veggies    Recommended Physical Activity Goals Kim Miles has been advised to work up to 300-450  minutes of moderate intensity aerobic activity a week and strengthening exercises 2-3 times per week for cardiovascular health, weight loss maintenance and preservation of muscle mass.   She has agreed to : maintain current level of activity.    Pharmacotherapy We discussed various medication options to help Kim Miles with her weight loss efforts and we both agreed to: Begin nutritional and behavioral strategies   ASSOCIATED CONDITIONS ADDRESSED TODAY:  Fatigue Assessment & Plan: Kim Miles does feel that her weight is causing her energy to be lower than it should be. Fatigue may be related to obesity, depression or many other causes. she does not appear to have any red flag symptoms and this appears to most likely be related to her current lifestyle habits and dietary intake.  Labs will be ordered and reviewed with her at their next office visit in two weeks.  Epworth sleepiness scale is 4 and appears to be within normal limits. Kim Miles admits to daytime somnolence and admits to waking up still tired. Patient has a history of symptoms of morning fatigue. Kim Miles generally gets 7 hours of sleep per night, and states that she has nightime awakenings. Snoring is present. Apneic episodes are not present.   ECG: Performed and reviewed/ interpreted independently.  Normal sinus rhythm, rate 73 bpm; reassuring without any acute  abnormalities, will continue to monitor for symptoms     Shortness of breath on exertion Assessment & Plan: Kim Miles does feel that she gets out of breath more easily than she used to when she exercises and seems to be worsening over time with weight gain.  This has gotten worse recently. Kim Miles denies shortness of breath at rest or orthopnea. Pt denies  chest pain, dizziness, heart palpitations, or excessive diaphoresis or nausea with activity.  This is not new and is ongoing.  Kim Miles's shortness of breath appears to be obesity related and exercise induced, as they do not appear to have any red flag symptoms/ concerns today.  Also, this condition appears to be related to a state of poor cardiovascular conditioning   Obtain labs today and will be reviewed with her at their next office visit in two weeks.  Indirect Calorimeter completed today to help guide our dietary regimen. It shows a VO2 of 244 and a REE of 1685.  Her calculated basal metabolic rate is 8449 thus her resting energy expenditure is better than expected.  Patient agreed to work on weight loss at this time.  As [name] progresses through our weight loss program, we will gradually increase exercise as tolerated to treat her current condition.   If [name] follows our recommendations and loses 5-10% of their weight without improvement of her shortness of breath or if at any time, symptoms become more concerning, they agree to urgently follow up with their PCP/ specialist for further consideration/ evaluation.   Kim Miles verbalizes agreement with this plan.    Depression, recurrent - emotional eating Depression Screen  Assessment & Plan: Her PHQ-9 score was 2 today. Making life somewhat difficult. Denies any SI/HI. On Lexapro  20 mg since roughly 2018. Good compliance and tolerance. Pt states that her mood is very well controlled. She endorses that roughly 6 months ago her Lexapro  dosage went from 10 mg to 20 mg. She had some anxiety that caused them to revisit the dosage and since then she feels her mood has been controlled.  Pt states that she eats when bored and as a reward. She denies that her emotions drive her eating habits. She feels like it is extremely rare and is well controlled. She declines seeing Dr. Sharron at this time. Continue with medication and begin mindful eating practices.      Prediabetes Assessment & Plan Lab Results  Component Value Date   HGBA1C 6.3 05/04/2024   HGBA1C 6.1 (H) 12/20/2023   HGBA1C 6.1 (H) 06/16/2023    Diet and life style intervened. Pt has never been on any prediabetic medications before. She has a history of gestational diabetes and was not on any insulin  except for while she was in the hospital. A1C levels have increased from 5.7 to 6.3 in the labs from 3 months ago. Explained to pt the relationship of simple carbs and insulin  and increased snacking. Begin following prudent meal plan and decreasing simple carbs and sugar. Will obtain labs today.     Essential hypertension Assessment & Plan BP Readings from Last 3 Encounters:  08/15/24 137/85  07/17/24 108/76  06/13/24 123/82   The ASCVD Risk score (Arnett DK, et al., 2019) failed to calculate for the following reasons:   The 2019 ASCVD risk score is only valid for ages 47 to 14  Lab Results  Component Value Date   CREATININE 0.57 12/20/2023   Pt states that she was diagnosed several years ago and has a family history of HTN.  She is currently on Diovan  80 mg. Good compliance and tolerance. No acute concerns today. Begin following prudent nutritional meal plan and decreasing processed, frozen, or prepackaged to avoid excess salt.     Mixed hyperlipidemia Assessment & Plan Lab Results  Component Value Date   CHOL 150 12/20/2023   HDL 39 (L) 12/20/2023   LDLCALC 89 12/20/2023   TRIG 122 12/20/2023   CHOLHDL 3.8 12/20/2023    On Crestor  5 mg. Good compliance and tolerance. Pt states that she has been on the medication for about a year. She has family history of heart attacks her grandfather and father. Pts LDL levels have decreased from 189 to 89 in result of Crestor  medication. Continue medication and begin following prudent meal plan and decreasing foods high in trans and saturated fat. Will obtain labs today and review at next OV.       FOLLOW UP:   Follow up in 2  weeks. She was informed of the importance of frequent follow up visits to maximize her success with intensive lifestyle modifications for her multiple health conditions.  Kim Miles is aware that we will review all of her lab results at our next visit.  She is aware that if anything is critical/ life threatening with the results, we will be contacting her via MyChart prior to the office visit to discuss management.     Chief Complaint:   OBESITY Kim Miles (MR# [id number]) is a pleasant 33 y.o. female who presents for evaluation and treatment of obesity and related comorbidities. Current BMI is Body mass index is 38.53 kg/m. Kim Miles has been struggling with her weight for many years and has been unsuccessful in either losing weight, maintaining weight loss, or reaching her healthy weight goal.  Kim Miles is currently in the action stage of change and ready to dedicate time achieving and maintaining a healthier weight. Kim Miles is interested in becoming our patient and working on intensive lifestyle modifications including (but not limited to) diet and exercise for weight loss.  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 Patient is married to Kim Miles and has children. She lives with her spouse and son.  - Pt has not had bariatric surgery and is not considering it in the future.   - She has a 43 year old child named Kim Miles.   - Pt has never smoked  - She walks 2 times a week for 30 to 45 min  - Wished to be 140 lbs in 6 months which is her college graduation weight  - Heaviest weight is currently at 194 lbs  - Nadir is 33 lbs at 33 years old   - She has tried Special K diet as a kid but none as an adult  - Pt eats outside of home 7+ times a week   - Eats fast food or take out 5 days a week  - Obstacles to cooking are: she is too tired, busy with her child, has a small kitchen and the food she cooks isn't as good to her.   - Craves  sweets  - Does not snack after dinner or between meals   - Snacks on cookies and crackers   - She wakes up in the middle of the night and has to eat something before going back to sleep   - Does not skip meals   - States that eating healthy is difficult with a toddler  - Drinks caloric beverages  like; coffee with cream, sugars and flavors, milk, sweet tea with sugar and smoothies from tropical smoothie.   - Worst food habit is convenience over everything      Subjective:   This is the patient's first visit at [facility].  The patient's NEW PATIENT PACKET that they filled out prior to today's office visit was reviewed at length and information from that paperwork was included within the following office visit note.    Included in the packet: current and past health history, medications, allergies, ROS, gynecologic history (women only), surgical history, family history, social history, weight history, weight loss surgery history (for those that have had weight loss surgery), nutritional evaluation, mood and food questionnaire along with a depression screening (PHQ9) on all patients, an Epworth questionnaire, sleep habits questionnaire, patient life and health improvement goals questionnaire. These will all be scanned into the patient's chart under the media tab.   Review of Systems: Please refer to new patient packet scanned into media. Pertinent positives were addressed with patient today.  Reviewed by clinician on Miles of visit: allergies, medications, problem list, medical history, surgical history, family history, social history, and previous encounter notes.  During the visit, I independently reviewed the patient's EKG, bioimpedance scale results, and indirect calorimeter results. I used this information to tailor a meal plan for the patient that will help Kim Miles to lose weight and will improve her obesity-related conditions going forward.  I performed a  medically necessary appropriate examination and/or evaluation. I discussed the assessment and treatment plan with the patient. The patient was provided an opportunity to ask questions and all were answered. The patient agreed with the plan and demonstrated an understanding of the instructions. Labs were ordered today (unless patient declined them) and will be reviewed with the patient at our next visit unless more critical results need to be addressed immediately. Clinical information was updated and documented in the EMR.    Objective:   PHYSICAL EXAM: Blood pressure 137/85, pulse 75, temperature 98 F (36.7 C), height 4' 11.5 (1.511 m), weight 194 lb (88 kg), last menstrual period 08/05/2024, SpO2 99%. Body mass index is 38.53 kg/m.  General: Well Developed, well nourished, and in no acute distress.  HEENT: Normocephalic, atraumatic; EOMI, sclerae are anicteric. Skin: Warm and dry, good turgor Chest:  Normal excursion, shape, no gross ABN Respiratory: No conversational dyspnea; speaking in full sentences NeuroM-Sk:  Normal gross ROM * 4 extremities  Psych: A and O *3, insight adequate, mood- full   Anthropometric Measurements Height: 4' 11.5 (1.511 m) Weight: 194 lb (88 kg) BMI (Calculated): 38.54 Weight at Last Visit: 0 Weight Lost Since Last Visit: 0 Weight Gained Since Last Visit: 0 Starting Weight: 194lb Total Weight Loss (lbs): 0 lb (0 kg) Peak Weight: 194lb Waist Measurement : 41.5 inches   Body Composition  Body Fat %: 43.8 % Fat Mass (lbs): 85 lbs Muscle Mass (lbs): 103.6 lbs Total Body Water  (lbs): 72.8 lbs Visceral Fat Rating : 11   Other Clinical Data RMR: 1685 Fasting: Yes Labs: Yes Today's Visit #: 1 Starting Date: 08/15/24 Comments: New Patient    DIAGNOSTIC DATA REVIEWED:  BMET    Component Value Date/Time   NA 142 12/20/2023 0849   K 4.3 12/20/2023 0849   CL 103 12/20/2023 0849   CO2 21 12/20/2023 0849   GLUCOSE 115 (H) 12/20/2023 0849    GLUCOSE 113 (H) 06/10/2022 0747   BUN 9 12/20/2023 0849   CREATININE 0.57  12/20/2023 0849   CREATININE 0.64 06/11/2021 1127   CALCIUM  9.1 12/20/2023 0849   GFRNONAA >60 06/08/2022 0131   GFRNONAA 106 06/03/2020 1616   GFRAA 123 06/03/2020 1616   Lab Results  Component Value Date   HGBA1C 6.3 05/04/2024   HGBA1C 5.7 (H) 08/15/2019   No results found for: INSULIN  Lab Results  Component Value Date   TSH 0.738 12/20/2023   CBC    Component Value Date/Time   WBC 9.4 12/20/2023 0849   WBC 9.2 06/08/2022 0131   RBC 4.52 12/20/2023 0849   RBC 3.53 (L) 06/08/2022 0131   HGB 13.3 12/20/2023 0849   HCT 40.9 12/20/2023 0849   PLT 292 12/20/2023 0849   MCV 91 12/20/2023 0849   MCH 29.4 12/20/2023 0849   MCH 30.3 06/08/2022 0131   MCHC 32.5 12/20/2023 0849   MCHC 33.4 06/08/2022 0131   RDW 12.5 12/20/2023 0849   Iron Studies No results found for: IRON, TIBC, FERRITIN, IRONPCTSAT Lipid Panel     Component Value Date/Time   CHOL 150 12/20/2023 0849   TRIG 122 12/20/2023 0849   HDL 39 (L) 12/20/2023 0849   CHOLHDL 3.8 12/20/2023 0849   CHOLHDL 5.7 (H) 06/11/2021 1127   LDLCALC 89 12/20/2023 0849   LDLCALC 182 (H) 06/11/2021 1127   Hepatic Function Panel     Component Value Date/Time   PROT 6.7 12/20/2023 0849   ALBUMIN 4.1 12/20/2023 0849   AST 21 12/20/2023 0849   ALT 10 12/20/2023 0849   ALKPHOS 94 12/20/2023 0849   BILITOT 0.3 12/20/2023 0849      Component Value Date/Time   TSH 0.738 12/20/2023 0849   TSH 0.933 03/01/2023 0909   Nutritional Lab Results  Component Value Date   VD25OH 26.71 (L) 05/04/2024   VD25OH 17.4 (L) 12/20/2023    Attestation Statements:   LILLETTE Sonny Laroche, acting as a medical scribe for Kim Jenkins, DO., have compiled all relevant documentation for today's office visit on behalf of Kim Jenkins, DO, while in the presence of [employer], DO.  I have spent 51 minutes in the care of the patient today including: 32  minutes face-to-face assessing and reviewing listed medical problems above as outlined in office visit note, providing nutritional and behavioral counseling as outlined in obesity care plan, independently interpreting results and goals of care, see listed medical problems, and discussing biometric information and progress. 11 minutes was spent on review of chart/new patient packet and additional post-visit documentation.  I have reviewed the above documentation for accuracy and completeness, and I agree with the above. Kim JINNY Miles, D.O.  The 21st Century Cures Act was signed into law in 2016 which includes the topic of electronic health records.  This provides immediate access to information in MyChart.  This includes consultation notes, operative notes, office notes, lab results and pathology reports.  If you have any questions about what you read please let us  know at your next visit so we can discuss your concerns and take corrective action if need be.  We are right here with you.

## 2024-08-16 LAB — CBC WITH DIFFERENTIAL/PLATELET
Basophils Absolute: 0.1 x10E3/uL (ref 0.0–0.2)
Basos: 1 %
EOS (ABSOLUTE): 0.2 x10E3/uL (ref 0.0–0.4)
Eos: 3 %
Hematocrit: 40.8 % (ref 34.0–46.6)
Hemoglobin: 13.2 g/dL (ref 11.1–15.9)
Immature Grans (Abs): 0 x10E3/uL (ref 0.0–0.1)
Immature Granulocytes: 0 %
Lymphocytes Absolute: 1.7 x10E3/uL (ref 0.7–3.1)
Lymphs: 27 %
MCH: 30 pg (ref 26.6–33.0)
MCHC: 32.4 g/dL (ref 31.5–35.7)
MCV: 93 fL (ref 79–97)
Monocytes Absolute: 0.7 x10E3/uL (ref 0.1–0.9)
Monocytes: 11 %
Neutrophils Absolute: 3.8 x10E3/uL (ref 1.4–7.0)
Neutrophils: 58 %
Platelets: 273 x10E3/uL (ref 150–450)
RBC: 4.4 x10E6/uL (ref 3.77–5.28)
RDW: 12.3 % (ref 11.7–15.4)
WBC: 6.5 x10E3/uL (ref 3.4–10.8)

## 2024-08-16 LAB — COMPREHENSIVE METABOLIC PANEL WITH GFR
ALT: 15 IU/L (ref 0–32)
AST: 27 IU/L (ref 0–40)
Albumin: 4.3 g/dL (ref 3.9–4.9)
Alkaline Phosphatase: 90 IU/L (ref 41–116)
BUN/Creatinine Ratio: 19 (ref 9–23)
BUN: 11 mg/dL (ref 6–20)
Bilirubin Total: 0.3 mg/dL (ref 0.0–1.2)
CO2: 22 mmol/L (ref 20–29)
Calcium: 9.1 mg/dL (ref 8.7–10.2)
Chloride: 101 mmol/L (ref 96–106)
Creatinine, Ser: 0.58 mg/dL (ref 0.57–1.00)
Globulin, Total: 2.9 g/dL (ref 1.5–4.5)
Glucose: 109 mg/dL — ABNORMAL HIGH (ref 70–99)
Potassium: 4.3 mmol/L (ref 3.5–5.2)
Sodium: 138 mmol/L (ref 134–144)
Total Protein: 7.2 g/dL (ref 6.0–8.5)
eGFR: 121 mL/min/1.73 (ref >59)

## 2024-08-16 LAB — LIPID PANEL
Chol/HDL Ratio: 3.7 ratio (ref 0.0–4.4)
Cholesterol, Total: 151 mg/dL (ref 100–199)
HDL: 41 mg/dL (ref >39)
LDL Chol Calc (NIH): 86 mg/dL (ref 0–99)
Triglycerides: 137 mg/dL (ref 0–149)
VLDL Cholesterol Cal: 24 mg/dL (ref 5–40)

## 2024-08-16 LAB — FOLATE: Folate: >20 ng/mL (ref >3.0)

## 2024-08-16 LAB — TSH: TSH: 0.754 u[IU]/mL (ref 0.450–4.500)

## 2024-08-16 LAB — VITAMIN B12: Vitamin B-12: 337 pg/mL (ref 232–1245)

## 2024-08-16 LAB — HEMOGLOBIN A1C
Est. average glucose Bld gHb Est-mCnc: 128 mg/dL
Hgb A1c MFr Bld: 6.1 % — ABNORMAL HIGH (ref 4.8–5.6)

## 2024-08-16 LAB — INSULIN, RANDOM: INSULIN: 20.7 u[IU]/mL (ref 2.6–24.9)

## 2024-08-16 LAB — VITAMIN D 25 HYDROXY (VIT D DEFICIENCY, FRACTURES): Vit D, 25-Hydroxy: 61.1 ng/mL (ref 30.0–100.0)

## 2024-08-16 LAB — T4, FREE: Free T4: 0.98 ng/dL (ref 0.82–1.77)

## 2024-08-29 ENCOUNTER — Encounter (INDEPENDENT_AMBULATORY_CARE_PROVIDER_SITE_OTHER): Payer: Self-pay | Admitting: Family Medicine

## 2024-08-29 ENCOUNTER — Ambulatory Visit (INDEPENDENT_AMBULATORY_CARE_PROVIDER_SITE_OTHER): Payer: Self-pay | Admitting: Family Medicine

## 2024-08-29 DIAGNOSIS — I10 Essential (primary) hypertension: Secondary | ICD-10-CM

## 2024-08-29 DIAGNOSIS — Z6837 Body mass index (BMI) 37.0-37.9, adult: Secondary | ICD-10-CM

## 2024-08-29 DIAGNOSIS — F339 Major depressive disorder, recurrent, unspecified: Secondary | ICD-10-CM

## 2024-08-29 DIAGNOSIS — R7303 Prediabetes: Secondary | ICD-10-CM

## 2024-08-29 DIAGNOSIS — E782 Mixed hyperlipidemia: Secondary | ICD-10-CM | POA: Diagnosis not present

## 2024-08-29 DIAGNOSIS — E669 Obesity, unspecified: Secondary | ICD-10-CM

## 2024-08-29 DIAGNOSIS — F5089 Other specified eating disorder: Secondary | ICD-10-CM

## 2024-08-29 DIAGNOSIS — E538 Deficiency of other specified B group vitamins: Secondary | ICD-10-CM

## 2024-08-29 MED ORDER — CYANOCOBALAMIN 500 MCG PO TABS: 500.0000 ug | ORAL_TABLET | Freq: Every day | ORAL | Status: AC

## 2024-08-29 NOTE — Progress Notes (Signed)
 Kim Miles, D.O.  ABFM, ABOM Clinical Bariatric Medicine Physician  Office located at: [address] [place], [place]  [address]    FOR THE CHRONIC DISEASE OF OBESITY:  Chief complaint: Obesity Kim Miles is here to discuss her progress with her obesity treatment plan.   History of present illness / Interval history:  Kim Miles is here today for her first follow-up office visit since starting the program with us . Patient is off to a good start and has lost 5 lbs. Patient states that everything went well but she did have 1 day on her anniversary that she ate off plan. She endorses that her clothes are fitting her differently.     08/15/24 08/29/24 10:00   Body Fat % 43.8 % 41.3 %  Muscle Mass (lbs) 103.6 lbs 105.8 lbs  Fat Mass (lbs) 85 lbs 78.4 lbs  Total Body Water  (lbs) 72.8 lbs 73.2 lbs  Visceral Fat Rating  11 10    Counseling done today with patient on how various foods will affect these numbers and how to maximize fat loss success   Total lbs lost to date: - 5 lbs Total Fat Mass in lbs lost to date: + 2.2 lbs Total weight loss percentage to date: - 2.58    Obesity, beginning BMI 38.54 Obesity (BMI 30-39.9) current BMI 37.55 Nutrition Therapy She is on the Category 1 Plan with 8 oz protein at dinner w/ lunch options and states she is following her eating plan approximately 90 % of the time.   - Tracking Calories/Macros: no  - Eating More Whole Foods: yes  - Adequate Protein Intake: yes  - Adequate Water  Intake: no   - Skipping Meals: no   - Sleeping 7-9 Hours/ Night: yes  Akita is currently in the action stage of change. As such, her goal is to continue weight management plan.  She has agreed to: continue current plan   Physical Activity Delani Kohli is walking, 40 minutes, 5 to 7 days per wk currently  Laquitha has been advised to work up to 300-450 minutes of moderate intensity aerobic activity a week and strengthening exercises 2-3  times per week for cardiovascular health, weight loss maintenance and preservation of muscle mass.  She has agreed to : Continue current level of physical activity    Behavioral Modifications Evidence-based interventions for health behavior change were utilized today including the discussion of  1) self monitoring techniques:    - Continue measuring lean proteins   2) problem-solving barriers:    3) self care:    4) SMART goals for next OV:   Regarding patient's less desirable eating habits and patterns, we employed the technique of small changes.   We discussed the following today: increasing lean protein intake to established goals, work on meal planning and preparation, and planning for success Additional resources provided today: Handout on balanced plate concepts.  , Handout on Pre-Diabetes education , and Handout on insulin  resistance education   Medical Interventions/ Pharmacotherapy Previous Bariatric surgery: n/a Pharmacotherapy for weight loss: She is not currently taking medications  for medical weight loss.    We discussed various medication options to help Cardelia with her weight loss efforts and we both agreed to : Continue with current nutritional and behavioral strategies   OBESITY RELATED CONDITIONS ADDRESSED TODAY: Extensive review of labs performed today that were drawn at the new patient office visit on 08/15/24.   Prediabetes Assessment & Plan Lab Results  Component Value  Date   HGBA1C 6.1 (H) 08/15/2024   HGBA1C 6.3 05/04/2024   HGBA1C 6.1 (H) 12/20/2023   INSULIN  20.7 08/15/2024    Lab Results  Component Value Date   TSH 0.754 08/15/2024   FREET4 0.98 08/15/2024      Component Value Date/Time   NA 138 08/15/2024 0943   K 4.3 08/15/2024 0943   CL 101 08/15/2024 0943   CO2 22 08/15/2024 0943   GLUCOSE 109 (H) 08/15/2024 0943   GLUCOSE 113 (H) 06/10/2022 0747   BUN 11 08/15/2024 0943   CREATININE 0.58 08/15/2024 0943   CREATININE 0.64 06/11/2021  1127   CALCIUM  9.1 08/15/2024 0943   PROT 7.2 08/15/2024 0943   ALBUMIN 4.3 08/15/2024 0943   AST 27 08/15/2024 0943   ALT 15 08/15/2024 0943   ALKPHOS 90 08/15/2024 0943   BILITOT 0.3 08/15/2024 0943   EGFR 121 08/15/2024 0943   GFRNONAA >60 06/08/2022 0131   GFRNONAA 106 06/03/2020 1616  CBC    Component Value Date/Time   WBC 6.5 08/15/2024 0943   WBC 9.2 06/08/2022 0131   RBC 4.40 08/15/2024 0943   RBC 3.53 (L) 06/08/2022 0131   HGB 13.2 08/15/2024 0943   HCT 40.8 08/15/2024 0943   PLT 273 08/15/2024 0943   MCV 93 08/15/2024 0943   MCH 30.0 08/15/2024 0943   MCH 30.3 06/08/2022 0131   MCHC 32.4 08/15/2024 0943   MCHC 33.4 06/08/2022 0131   RDW 12.3 08/15/2024 0943   LYMPHSABS 1.7 08/15/2024 0943   EOSABS 0.2 08/15/2024 0943   BASOSABS 0.1 08/15/2024 0943   Patients A1C level has decreased from 6.3 to 6.1. Her insulin  level is currently at 20.7 and goal levels are less than 5. Explained to patient how insulin  resistance works in the body and as she sheds fat insulin  levels will decrease. She is not on any medications and is diet and life style controlled. Pt endorses that she has had some cravings but no excessive hunger. She states that she is aware that her cravings increase when she does not eat all her protein. Continue to decrease simple carbs/ sugars; increase fiber and proteins. Explained role of simple carbs and insulin  levels on hunger and cravings.  Thyroid panel is WNL. No acute concerns today.  Patients kidney function show that everything is at goal no concerns today. Glucose levels are slightly elevated but nothing concerning at this moment. Liver enzymes are at goal and show no evidence of fatty liver.  Blood counts are within normal limits and show no abnormalities.     Mixed hyperlipidemia Assessment & Plan Lab Results  Component Value Date   CHOL 151 08/15/2024   HDL 41 08/15/2024   LDLCALC 86 08/15/2024   TRIG 137 08/15/2024   CHOLHDL 3.7 08/15/2024     On Crestor  5 mg daily. With reported good compliance and tolerance. Patients LDL has decreased from 89 to 86. HDL levels are currently 41 which is at goal. Trig levels will decrease as patient reduces carbs. Continue with medications and decreasing saturated and trans fat. Increase exercise as tolerated.     Depression, recurrent - emotional eating Assessment & Plan On Lexapro  20 mg daily, with good compliance and tolerance. Patient denies any SI/HI. Patient states that her emotional eating has been under better control. She endorses that there was a couple of days where she was having a rough day and thought to herself that she deserved it but since there was nothing in her house that  was unhealthy she got over it. Continue practicing mindful eating and building strategies to overcome emotional eating.     B12 deficiency due to diet Assessment & Plan Lab Results  Component Value Date   VITAMINB12 337 08/15/2024   FOLATE >20.0 08/15/2024   Lab Results  Component Value Date   VD25OH 61.1 08/15/2024   VD25OH 26.71 (L) 05/04/2024   VD25OH 17.4 (L) 12/20/2023   Patients B12 levels were not at goal. Goal levels are 400 and 500. Patient states that she takes a MVM that has B12 in it. Explained to patient that low B12 levels can lead to feeling sluggish and tired. Recommend patient to start OTC B12 500 mcg daily in addition to her MVM.   Patients Vit D levels are at goal. Pt takes OTC Vit D 5000 lU units every day. Goal is between 50 and 70. Continue with supplementation. Will recheck levels in 3-4 months.    Essential hypertension Assessment & Plan BP Readings from Last 3 Encounters:  08/29/24 122/74  08/15/24 137/85  07/17/24 108/76   The ASCVD Risk score (Arnett DK, et al., 2019) failed to calculate for the following reasons:   The 2019 ASCVD risk score is only valid for ages 57 to 62  Lab Results  Component Value Date   CREATININE 0.58 08/15/2024   On Diovan  80 mg  daily. With reported good compliance and tolerance. BP today is at goal- well controlled. Denies any lightheadedness or dizziness. No acute concerns today. Continue with medications and decreasing food high in sodium.    Meds ordered this encounter  Medications   cyanocobalamin (VITAMIN B12) 500 MCG tablet    Sig: Take 1 tablet (500 mcg total) by mouth daily.     FOLLOW UP:   Return 09/13/2024 at 8:40 AM  She was informed of the importance of frequent follow up visits to maximize her success with intensive lifestyle modifications for her multiple health conditions.   Weight Summary and Biometrics   Weight Lost Since Last Visit: 5lb  Weight Gained Since Last Visit: 0lb   Vitals Temp: 98 F (36.7 C) BP: 122/74 Pulse Rate: 74 SpO2: 99 %   Anthropometric Measurements Height: 4' 11.5 (1.511 m) Weight: 189 lb (85.7 kg) BMI (Calculated): 37.55 Weight at Last Visit: 194lb Weight Lost Since Last Visit: 5lb Weight Gained Since Last Visit: 0lb Starting Weight: 194lb Total Weight Loss (lbs): 5 lb (2.268 kg) Peak Weight: 194lb   Body Composition  Body Fat %: 41.3 % Fat Mass (lbs): 78.4 lbs Muscle Mass (lbs): 105.8 lbs Total Body Water  (lbs): 73.2 lbs Visceral Fat Rating : 10   Other Clinical Data Fasting: no Labs: no Today's Visit #: 2 Starting Date: 08/15/24 Comments: 1st FU     Objective:  Review of Systems:  Pertinent positives were addressed with patient today.   Reviewed by clinician on day of visit: allergies, medications, problem list, medical history, surgical history, family history, social history, and previous encounter notes.  PHYSICAL EXAM: Blood pressure 122/74, pulse 74, temperature 98 F (36.7 C), height 4' 11.5 (1.511 m), weight 189 lb (85.7 kg), last menstrual period 08/05/2024, SpO2 99%. Body mass index is 37.53 kg/m. General: she is overweight, cooperative and in no acute distress.   HEENT: EOMI, sclerae are anicteric. Lungs: Normal  breathing effort, no conversational dyspnea. M-Sk:  Normal gross ROM * 4 extremities  PSYCH: Has normal mood, affect and thought process. Neurologic: No gross sensory or motor deficits. Well developed, A and O *  3  DIAGNOSTIC DATA REVIEWED:  BMET    Component Value Date/Time   NA 138 08/15/2024 0943   K 4.3 08/15/2024 0943   CL 101 08/15/2024 0943   CO2 22 08/15/2024 0943   GLUCOSE 109 (H) 08/15/2024 0943   GLUCOSE 113 (H) 06/10/2022 0747   BUN 11 08/15/2024 0943   CREATININE 0.58 08/15/2024 0943   CREATININE 0.64 06/11/2021 1127   CALCIUM  9.1 08/15/2024 0943   GFRNONAA >60 06/08/2022 0131   GFRNONAA 106 06/03/2020 1616   GFRAA 123 06/03/2020 1616   Lab Results  Component Value Date   HGBA1C 6.1 (H) 08/15/2024   HGBA1C 5.7 (H) 08/15/2019   Lab Results  Component Value Date   INSULIN  20.7 08/15/2024   Lab Results  Component Value Date   TSH 0.754 08/15/2024   CBC    Component Value Date/Time   WBC 6.5 08/15/2024 0943   WBC 9.2 06/08/2022 0131   RBC 4.40 08/15/2024 0943   RBC 3.53 (L) 06/08/2022 0131   HGB 13.2 08/15/2024 0943   HCT 40.8 08/15/2024 0943   PLT 273 08/15/2024 0943   MCV 93 08/15/2024 0943   MCH 30.0 08/15/2024 0943   MCH 30.3 06/08/2022 0131   MCHC 32.4 08/15/2024 0943   MCHC 33.4 06/08/2022 0131   RDW 12.3 08/15/2024 0943   Iron Studies No results found for: IRON, TIBC, FERRITIN, IRONPCTSAT Lipid Panel     Component Value Date/Time   CHOL 151 08/15/2024 0943   TRIG 137 08/15/2024 0943   HDL 41 08/15/2024 0943   CHOLHDL 3.7 08/15/2024 0943   CHOLHDL 5.7 (H) 06/11/2021 1127   LDLCALC 86 08/15/2024 0943   LDLCALC 182 (H) 06/11/2021 1127   Hepatic Function Panel     Component Value Date/Time   PROT 7.2 08/15/2024 0943   ALBUMIN 4.3 08/15/2024 0943   AST 27 08/15/2024 0943   ALT 15 08/15/2024 0943   ALKPHOS 90 08/15/2024 0943   BILITOT 0.3 08/15/2024 0943      Component Value Date/Time   TSH 0.754 08/15/2024 0943    Nutritional Lab Results  Component Value Date   VD25OH 61.1 08/15/2024   VD25OH 26.71 (L) 05/04/2024   VD25OH 17.4 (L) 12/20/2023     Attestations:   LILLETTE Sonny Laroche, acting as a stage manager for Kim Jenkins, DO., have compiled all relevant documentation for today's office visit on behalf of Kim Jenkins, DO, while in the presence of [employer], DO.  I have spent 40 minutes in the care of the patient today.  30 minutes was spent on face-to-face counseling and reviewing listed medical problems above as outlined in office visit note, providing nutritional and behavioral counseling as outlined in obesity care plan, independently interpreting results and goals of care, see listed medical problems, and discussing biometric information and progress. We reviewed her meal plan and discussed how the foods she is eating is affecting each one of her labs. Pt educated on why we want her to eat various foods in various amounts and has a better understanding of the nutritional plan because of this. All questions were answered today. 10 minutes was spent on pre-chart review and additional documentation after the visit.   I have reviewed the above documentation for accuracy and completeness, and I agree with the above. Kim JINNY Miles, D.O.  The 21st Century Cures Act was signed into law in 2016 which includes the topic of electronic health records.  This provides immediate access to information in MyChart.  This includes consultation notes, operative notes, office notes, lab results and pathology reports.  If you have any questions about what you read please let us  know at your next visit so we can discuss your concerns and take corrective action if need be.  We are right here with you.

## 2024-08-31 NOTE — Addendum Note (Signed)
 Addended by: BONNIE IHA A on: 08/31/2024 05:04 PM   Modules accepted: Orders

## 2024-09-05 ENCOUNTER — Other Ambulatory Visit: Payer: Self-pay | Admitting: Physician Assistant

## 2024-09-05 DIAGNOSIS — F331 Major depressive disorder, recurrent, moderate: Secondary | ICD-10-CM

## 2024-09-05 DIAGNOSIS — I10 Essential (primary) hypertension: Secondary | ICD-10-CM

## 2024-09-05 MED ORDER — VALSARTAN 80 MG PO TABS: 80.0000 mg | ORAL_TABLET | Freq: Every day | ORAL | 0 refills | Status: DC

## 2024-09-05 MED ORDER — ESCITALOPRAM OXALATE 20 MG PO TABS: 20.0000 mg | ORAL_TABLET | Freq: Every day | ORAL | 1 refills | Status: AC

## 2024-09-05 NOTE — Telephone Encounter (Unsigned)
 Copied from CRM [id number]. Topic: Clinical - Medication Refill >> Sep 05, 2024  8:41 AM Berneda FALCON wrote: Medication: escitalopram  (LEXAPRO ) 20 MG tablet valsartan  (DIOVAN ) 80 MG tablet   Please note, that patient has a TOC appt with Melissa O'sullivan in Feb 2026 (first avail), PCP resigned  Has the patient contacted their pharmacy? Yes (Agent: If no, request that the patient contact the pharmacy for the refill. If patient does not wish to contact the pharmacy document the reason why and proceed with request.) (Agent: If yes, when and what did the pharmacy advise?)  This is the patient's preferred pharmacy:  [id number] [address] [place], [place] - [address] [address] Phone: [phone] Fax: [phone]  Is this the correct pharmacy for this prescription? Yes If no, delete pharmacy and type the correct one.   Has the prescription been filled recently? No  Is the patient out of the medication? No  Has the patient been seen for an appointment in the last year OR does the patient have an upcoming appointment? Yes  Can we respond through MyChart? Yes  Agent: Please be advised that Rx refills may take up to 3 business days. We ask that you follow-up with your pharmacy.

## 2024-09-13 ENCOUNTER — Ambulatory Visit (INDEPENDENT_AMBULATORY_CARE_PROVIDER_SITE_OTHER): Payer: Self-pay | Admitting: Family Medicine

## 2024-09-13 ENCOUNTER — Encounter (INDEPENDENT_AMBULATORY_CARE_PROVIDER_SITE_OTHER): Payer: Self-pay | Admitting: Family Medicine

## 2024-09-13 VITALS — BP 118/83 | HR 81 | Temp 98.2°F | Ht 59.5 in | Wt 187.0 lb

## 2024-09-13 DIAGNOSIS — Z6837 Body mass index (BMI) 37.0-37.9, adult: Secondary | ICD-10-CM | POA: Diagnosis not present

## 2024-09-13 DIAGNOSIS — I10 Essential (primary) hypertension: Secondary | ICD-10-CM | POA: Diagnosis not present

## 2024-09-13 DIAGNOSIS — E669 Obesity, unspecified: Secondary | ICD-10-CM | POA: Diagnosis not present

## 2024-09-13 DIAGNOSIS — R7303 Prediabetes: Secondary | ICD-10-CM | POA: Diagnosis not present

## 2024-09-13 MED ORDER — METFORMIN HCL 500 MG PO TABS: 500.0000 mg | ORAL_TABLET | Freq: Two times a day (BID) | ORAL | 0 refills | Status: DC

## 2024-09-13 NOTE — Progress Notes (Signed)
 Kim Miles, D.O.  ABFM, ABOM Specializing in Clinical Bariatric Medicine  Office located at: [address] [place], [place]  [address]    FOR THE CHRONIC DISEASE OF OBESITY:   Obesity, beginning BMI 38.54 Obesity (BMI 30-39.9) current BMI 37.15  Weight Summary and Body Composition Analysis  Weight Lost Since Last Visit: 2lb  Weight Gained Since Last Visit: 0lb    Vitals Temp: 98.2 F (36.8 C) BP: 118/83 Pulse Rate: 81 SpO2: 99 %   Anthropometric Measurements Height: 4' 11.5 (1.511 m) Weight: 187 lb (84.8 kg) BMI (Calculated): 37.15 Weight at Last Visit: 189lb Weight Lost Since Last Visit: 2lb Weight Gained Since Last Visit: 0lb Starting Weight: 194lb Total Weight Loss (lbs): 7 lb (3.175 kg) Peak Weight: 194lb   Body Composition  Body Fat %: 39.5 % Fat Mass (lbs): 74 lbs Muscle Mass (lbs): 107.4 lbs Total Body Water  (lbs): 72 lbs Visceral Fat Rating : 9   Other Clinical Data Fasting: yes Labs: no Today's Visit #: 3 Starting Date: 08/15/24    Chief complaint: Obesity  Interval History [name] is here for a follow-up office visit to discuss her progress with her obesity treatment plan. She is on the Category 1 Plan with 8 ounces of protein at dinner and states she is following her eating plan approximately 90% of the time. She is walking 30  minutes 5 days per week  She has experienced a weight loss of 2 lbs since last OV on 08/29/2024.   She mentions feeling bored with her meal plan foods at the moment.   Her dietary and life habits include:  - Tracking Calories/Macros: not on a tracking plan at this time  - Eating More Whole Foods: yes  - Adequate Protein Intake: yes  - Adequate Water  Intake: no  - Skipping Meals: no  - Sleeping 7-9 Hours/ Night: yes    08/29/24 10:00 09/13/24 08:00   Body Fat % 41.3 % 39.5 %  Muscle Mass (lbs) 105.8 lbs 107.4 lbs  Fat Mass (lbs) 78.4 lbs 74 lbs  Total Body Water  (lbs) 73.2  lbs 72 lbs  Visceral Fat Rating  10 9   Counseling done on how various foods will affect these numbers and how to maximize success  Total Fat mass lost to date: - 11 lbs Total lbs lost to date: - 7 lbs Total weight loss percentage to date: - 3.61 %   Nutritional and Behavioral Counseling:  We discussed the following today: the concept of paying to play (intentionally increasing exercise on days they will deviate from meal plan) , increasing lean protein intake to established goals, celebration eating strategies, going to the [employer] group for recipe ideas, and using GPT or another AI platform for recipe ideas- searching low calorie, low carb, high protein chicken recipes etc  Additional resources provided today: Handout on holiday eating recipes, Handout on risks/ benefits of metformin  and associated Myths of use, and Handout on benefits of metformin  for weight loss  Evidence-based interventions for health behavior change were utilized today including the discussion of self monitoring techniques, problem-solving barriers and SMART goal setting techniques.   Regarding patient's less desirable eating habits and patterns, we employed the technique of small changes.   SMART Goal(s) created today: measure lean protein intake   Recommended Dietary Goals Lucendia is currently in the action stage of change. As such, her goal is to continue weight management plan.  She has agreed to continue the Category 1  Plan with 8 ounces of protein at dinner   Recommended Physical Activity Goals Ronetta has been advised to work up to 300-450 minutes of moderate intensity aerobic activity a week and strengthening exercises 2-3 times per week for cardiovascular health, weight loss maintenance and preservation of muscle mass.   She may Continue to gradually increase the amount and intensity of exercise routine   Medical Interventions and Pharmacotherapy Previous Bariatric surgery: n/a Pharmacotherapy:  See Pre-DM note.    OBESITY RELATED CONDITIONS ADDRESSED TODAY:    Meds ordered this encounter  Medications   metFORMIN  (GLUCOPHAGE ) 500 MG tablet    Sig: Take 1 tablet (500 mg total) by mouth 2 (two) times daily with a meal.    Dispense:  60 tablet    Refill:  0     Prediabetes Assessment & Plan: Lab Results  Component Value Date   HGBA1C 6.1 (H) 08/15/2024   HGBA1C 6.3 05/04/2024   HGBA1C 6.1 (H) 12/20/2023   INSULIN  20.7 08/15/2024    Pre-DM currently managed with dietary and lifestyle interventions. She denies excessive hunger, but is having cravings for sweets. After providing educational handouts, discussing benefits, alternative treatment options and side effects patient will be prescribed Metformin  500 mg two times daily; provided her written instructions on how to safely up-titrate to prescribed dose.  Of note, she was on Metformin  in the past for gestational diabetes and tolerated it quite well. Cont working on nutrition plan to decrease simple carbohydrates, increase lean proteins and exercise to promote weight loss and prevent progression to T2DM.    Essential hypertension Assessment & Plan: BP Readings from Last 3 Encounters:  09/13/24 118/83  08/29/24 122/74  08/15/24 137/85   BP at goal today. No acute concerns. Cont Valsartan  80 mg daily and low sodium heart healthy meal plan. Advance exercise as tolerated.    Objective:   PHYSICAL EXAM: Blood pressure 118/83, pulse 81, temperature 98.2 F (36.8 C), height 4' 11.5 (1.511 m), weight 187 lb (84.8 kg), last menstrual period 09/02/2024, SpO2 99%. Body mass index is 37.14 kg/m.  General: she is overweight, cooperative and in no acute distress. PSYCH: Has normal mood, affect and thought process.   HEENT: EOMI, sclerae are anicteric. Lungs: Normal breathing effort, no conversational dyspnea. Extremities: Moves * 4 Neurologic: A and O * 3, good insight  DIAGNOSTIC DATA REVIEWED: BMET    Component  Value Date/Time   NA 138 08/15/2024 0943   K 4.3 08/15/2024 0943   CL 101 08/15/2024 0943   CO2 22 08/15/2024 0943   GLUCOSE 109 (H) 08/15/2024 0943   GLUCOSE 113 (H) 06/10/2022 0747   BUN 11 08/15/2024 0943   CREATININE 0.58 08/15/2024 0943   CREATININE 0.64 06/11/2021 1127   CALCIUM  9.1 08/15/2024 0943   GFRNONAA >60 06/08/2022 0131   GFRNONAA 106 06/03/2020 1616   GFRAA 123 06/03/2020 1616   Lab Results  Component Value Date   HGBA1C 6.1 (H) 08/15/2024   HGBA1C 5.7 (H) 08/15/2019   Lab Results  Component Value Date   INSULIN  20.7 08/15/2024   Lab Results  Component Value Date   TSH 0.754 08/15/2024   CBC    Component Value Date/Time   WBC 6.5 08/15/2024 0943   WBC 9.2 06/08/2022 0131   RBC 4.40 08/15/2024 0943   RBC 3.53 (L) 06/08/2022 0131   HGB 13.2 08/15/2024 0943   HCT 40.8 08/15/2024 0943   PLT 273 08/15/2024 0943   MCV 93 08/15/2024 0943   MCH 30.0  08/15/2024 0943   MCH 30.3 06/08/2022 0131   MCHC 32.4 08/15/2024 0943   MCHC 33.4 06/08/2022 0131   RDW 12.3 08/15/2024 0943   Iron Studies No results found for: IRON, TIBC, FERRITIN, IRONPCTSAT Lipid Panel     Component Value Date/Time   CHOL 151 08/15/2024 0943   TRIG 137 08/15/2024 0943   HDL 41 08/15/2024 0943   CHOLHDL 3.7 08/15/2024 0943   CHOLHDL 5.7 (H) 06/11/2021 1127   LDLCALC 86 08/15/2024 0943   LDLCALC 182 (H) 06/11/2021 1127   Hepatic Function Panel     Component Value Date/Time   PROT 7.2 08/15/2024 0943   ALBUMIN 4.3 08/15/2024 0943   AST 27 08/15/2024 0943   ALT 15 08/15/2024 0943   ALKPHOS 90 08/15/2024 0943   BILITOT 0.3 08/15/2024 0943      Component Value Date/Time   TSH 0.754 08/15/2024 0943   Nutritional Lab Results  Component Value Date   VD25OH 61.1 08/15/2024   VD25OH 26.71 (L) 05/04/2024   VD25OH 17.4 (L) 12/20/2023     Follow up:   Return 09/28/2024 at 9:40 AM.  She was informed of the importance of frequent follow up visits to maximize her  success with intensive lifestyle modifications for her multiple health conditions.   Attestations:   I, Special Puri, acting as a stage manager for [employer], DO., have compiled all relevant documentation for today's office visit on behalf of Kim Jenkins, DO, while in the presence of [employer], DO.  Pertinent positives were addressed with patient today. Reviewed by clinician on day of visit: allergies, medications, problem list, medical history, surgical history, family history, social history, and previous encounter notes.  I have reviewed the above documentation for accuracy and completeness, and I agree with the above. Kim Miles, D.O.  The 21st Century Cures Act was signed into law in 2016 which includes the topic of electronic health records.  This provides immediate access to information in MyChart. This includes consultation notes, operative notes, office notes, lab results and pathology reports.  If you have any questions about what you read please let us  know at your next visit so we can discuss your concerns and take corrective action if need be.  We are right here with you.

## 2024-09-28 ENCOUNTER — Ambulatory Visit (INDEPENDENT_AMBULATORY_CARE_PROVIDER_SITE_OTHER): Admitting: Family Medicine

## 2024-09-28 ENCOUNTER — Encounter (INDEPENDENT_AMBULATORY_CARE_PROVIDER_SITE_OTHER): Payer: Self-pay | Admitting: Family Medicine

## 2024-09-28 DIAGNOSIS — E559 Vitamin D deficiency, unspecified: Secondary | ICD-10-CM

## 2024-09-28 DIAGNOSIS — E538 Deficiency of other specified B group vitamins: Secondary | ICD-10-CM

## 2024-09-28 DIAGNOSIS — R7303 Prediabetes: Secondary | ICD-10-CM

## 2024-09-28 DIAGNOSIS — E669 Obesity, unspecified: Secondary | ICD-10-CM

## 2024-09-28 DIAGNOSIS — I10 Essential (primary) hypertension: Secondary | ICD-10-CM

## 2024-09-28 MED ORDER — METFORMIN HCL 500 MG PO TABS: 500.0000 mg | ORAL_TABLET | Freq: Two times a day (BID) | ORAL | 0 refills | Status: DC

## 2024-09-28 NOTE — Progress Notes (Signed)
 Kim Miles, D.O.  ABFM, ABOM Specializing in Clinical Bariatric Medicine  Office located at: [address] [place], [place]  [address]      A) FOR THE CHRONIC DISEASE OF OBESITY:  Chief complaint: Obesity Kim Miles is here to discuss her progress with her obesity treatment plan.   History of present illness / Interval history:  Kim Miles is here today for her follow-up office visit.  Since last OV on 09/13/24, pt is down 2 lbs. She reports that the holiday was good because everyone was aware of her needs and she only ate little spoonfuls of different foods.     09/13/24 08:00 09/28/24 09:00   Body Fat % 39.5 % 42.2 %  Muscle Mass (lbs) 107.4 lbs 101.8 lbs  Fat Mass (lbs) 74 lbs 78.2 lbs  Total Body Water  (lbs) 72 lbs 72 lbs  Visceral Fat Rating  9 10   Counseling done on how various foods will affect these numbers and how to maximize success.  Total lbs lost to date: - 9  lbs Total Fat Mass in lbs lost to date: + 2 lbs Total weight loss percentage to date: - 4.64 %    Obesity, beginning BMI 38.54 Obesity (BMI 30-39.9) current BMI 36.75  Nutrition Therapy She is on  the Category 1 Plan with 8 ounces of protein at dinner and states she is following her eating plan approximately 60 % of the time.   - Tracking Calories/Macros: no   - Eating More Whole Foods: yes  - Adequate Protein Intake: yes  - Adequate Water  Intake: no   - Skipping Meals: no   - Sleeping 7-9 Hours/ Night: yes   Georgann is currently in the action stage of change. As such, her goal is to continue weight management plan.  She has agreed to: continue current plan   Physical Activity Harshitha is walking 30  minutes 4 to 5 days per week   Kierria has been advised to work up to 300-450 minutes of moderate intensity aerobic activity a week and strengthening exercises 2-3 times per week for cardiovascular health, weight loss maintenance and preservation of muscle mass.  She has agreed to :  Continue current level of physical activity    Behavioral Modifications Evidence-based interventions for health behavior change were utilized today including the discussion of  1) SMART goals for next OV:    - Continue follow meal plan and focusing on lean protein during holidays   Regarding patient's less desirable eating habits and patterns, we employed the technique of small changes.   We discussed the following today: increasing lean protein intake to established goals, work on meal planning and preparation, keeping healthy foods at home, decreasing eating out or consumption of processed foods, and making healthy choices when eating convenient foods, and staying on track while traveling and vacationing Additional resources provided today: Handout on traveling and holiday eating strategies   Medical Interventions/ Pharmacotherapy Previous Bariatric surgery: n/a Pharmacotherapy for weight loss: She is currently taking Metformin  2 tablets daily for medical weight loss.    We discussed various medication options to help [name] with her weight loss efforts and we both agreed to : Continue with current nutritional and behavioral strategies   B) OBESITY RELATED CONDITIONS ADDRESSED TODAY:  Prediabetes Assessment & Plan Lab Results  Component Value Date   HGBA1C 6.1 (H) 08/15/2024   HGBA1C 6.3 05/04/2024   HGBA1C 6.1 (H) 12/20/2023   INSULIN  20.7 08/15/2024  On Metformin  1 tablet twice daily. With reported good compliance and tolerance. Patient states that she does not have excessive hunger but has been feeing snacky for the last week. She reports that she will get something to drink and try to ignore the snacky feeling. She endorses getting all her protein in. She states that she has been having in increase in reflux especially at night when laying down. She is unsure if increasing to twice daily has helped at all. Continue with medication and following prudent meal plan and decreasing  simple carbs and sugars.     Essential hypertension Assessment & Plan BP Readings from Last 3 Encounters:  09/28/24 109/79  09/13/24 118/83  08/29/24 122/74   The ASCVD Risk score (Arnett DK, et al., 2019) failed to calculate for the following reasons:   The 2019 ASCVD risk score is only valid for ages 52 to 49  Lab Results  Component Value Date   CREATININE 0.58 08/15/2024   On Valsartan  80 mg once daily with good compliance and tolerance. BP today is at goal - well controlled. Patient is asx. Continue with medication and decreasing foods high in sodium like frozen repackaged foods.     Vitamin D  deficiency Assessment & Plan Lab Results  Component Value Date   VD25OH 61.1 08/15/2024   VD25OH 26.71 (L) 05/04/2024   VD25OH 17.4 (L) 12/20/2023   Taking Cholecalciferol 5000 units daily. With reported good compliance and tolerance. Patient states that she feeling a difference with her energy levels. Reminded patient the importance of at goal Vit D levels. Continue with supplementation. Will reassess in 3-4 months.     B12 deficiency due to diet Assessment & Plan Lab Results  Component Value Date   VITAMINB12 337 08/15/2024   FOLATE >20.0 08/15/2024   Taking Cyanocobalamin  500 mcg daily. With reported good compliance and tolerance. Patient denies having any difficulty with taking the supplementation. Continue with supplementation. Will obtain labs in 3-4 months.      Medications Discontinued During This Encounter  Medication Reason   metFORMIN  (GLUCOPHAGE ) 500 MG tablet Reorder     Meds ordered this encounter  Medications   metFORMIN  (GLUCOPHAGE ) 500 MG tablet    Sig: Take 1 tablet (500 mg total) by mouth 2 (two) times daily with a meal.    Dispense:  60 tablet    Refill:  0     Follow up:   Return 11/01/2024 at 9:00 AM  She was informed of the importance of frequent follow up visits to maximize her success with intensive lifestyle modifications for her multiple  health conditions.   Weight Summary and Biometrics   Weight Lost Since Last Visit: 2lb  Weight Gained Since Last Visit: 0lb   Vitals Temp: 98.1 F (36.7 C) BP: 109/79 Pulse Rate: 73 SpO2: 98 %   Anthropometric Measurements Height: 4' 11.5 (1.511 m) Weight: 185 lb (83.9 kg) BMI (Calculated): 36.75 Weight at Last Visit: 187lb Weight Lost Since Last Visit: 2lb Weight Gained Since Last Visit: 0lb Starting Weight: 194lb Total Weight Loss (lbs): 9 lb (4.082 kg) Peak Weight: 194lb   Body Composition  Body Fat %: 42.2 % Fat Mass (lbs): 78.2 lbs Muscle Mass (lbs): 101.8 lbs Total Body Water  (lbs): 72 lbs Visceral Fat Rating : 10   Other Clinical Data Fasting: no Labs: no Today's Visit #: 4 Starting Date: 08/15/24    Objective:   PHYSICAL EXAM: Blood pressure 109/79, pulse 73, temperature 98.1 F (36.7 C), height 4' 11.5 (1.511  m), weight 185 lb (83.9 kg), last menstrual period 09/02/2024, SpO2 98%. Body mass index is 36.74 kg/m.  General: she is overweight, cooperative and in no acute distress. PSYCH: Has normal mood, affect and thought process.   HEENT: EOMI, sclerae are anicteric. Lungs: Normal breathing effort, no conversational dyspnea. Extremities: Moves * 4 Neurologic: A and O * 3, good insight  DIAGNOSTIC DATA REVIEWED: BMET    Component Value Date/Time   NA 138 08/15/2024 0943   K 4.3 08/15/2024 0943   CL 101 08/15/2024 0943   CO2 22 08/15/2024 0943   GLUCOSE 109 (H) 08/15/2024 0943   GLUCOSE 113 (H) 06/10/2022 0747   BUN 11 08/15/2024 0943   CREATININE 0.58 08/15/2024 0943   CREATININE 0.64 06/11/2021 1127   CALCIUM  9.1 08/15/2024 0943   GFRNONAA >60 06/08/2022 0131   GFRNONAA 106 06/03/2020 1616   GFRAA 123 06/03/2020 1616   Lab Results  Component Value Date   HGBA1C 6.1 (H) 08/15/2024   HGBA1C 5.7 (H) 08/15/2019   Lab Results  Component Value Date   INSULIN  20.7 08/15/2024   Lab Results  Component Value Date   TSH 0.754  08/15/2024   CBC    Component Value Date/Time   WBC 6.5 08/15/2024 0943   WBC 9.2 06/08/2022 0131   RBC 4.40 08/15/2024 0943   RBC 3.53 (L) 06/08/2022 0131   HGB 13.2 08/15/2024 0943   HCT 40.8 08/15/2024 0943   PLT 273 08/15/2024 0943   MCV 93 08/15/2024 0943   MCH 30.0 08/15/2024 0943   MCH 30.3 06/08/2022 0131   MCHC 32.4 08/15/2024 0943   MCHC 33.4 06/08/2022 0131   RDW 12.3 08/15/2024 0943   Iron Studies No results found for: IRON, TIBC, FERRITIN, IRONPCTSAT Lipid Panel     Component Value Date/Time   CHOL 151 08/15/2024 0943   TRIG 137 08/15/2024 0943   HDL 41 08/15/2024 0943   CHOLHDL 3.7 08/15/2024 0943   CHOLHDL 5.7 (H) 06/11/2021 1127   LDLCALC 86 08/15/2024 0943   LDLCALC 182 (H) 06/11/2021 1127   Hepatic Function Panel     Component Value Date/Time   PROT 7.2 08/15/2024 0943   ALBUMIN 4.3 08/15/2024 0943   AST 27 08/15/2024 0943   ALT 15 08/15/2024 0943   ALKPHOS 90 08/15/2024 0943   BILITOT 0.3 08/15/2024 0943      Component Value Date/Time   TSH 0.754 08/15/2024 0943   Nutritional Lab Results  Component Value Date   VD25OH 61.1 08/15/2024   VD25OH 26.71 (L) 05/04/2024   VD25OH 17.4 (L) 12/20/2023    Attestations:   LILLETTE Sonny Laroche, acting as a stage manager for Kim Jenkins, DO., have compiled all relevant documentation for today's office visit on behalf of Kim Jenkins, DO, while in the presence of [employer], DO.   I have reviewed the above documentation for accuracy and completeness, and I agree with the above. Kim JINNY Miles, D.O.  The 21st Century Cures Act was signed into law in 2016 which includes the topic of electronic health records.  This provides immediate access to information in MyChart.  This includes consultation notes, operative notes, office notes, lab results and pathology reports.  If you have any questions about what you read please let us  know at your next visit so we can discuss your concerns  and take corrective action if need be.  We are right here with you.

## 2024-11-01 ENCOUNTER — Encounter (INDEPENDENT_AMBULATORY_CARE_PROVIDER_SITE_OTHER): Payer: Self-pay | Admitting: Family Medicine

## 2024-11-01 ENCOUNTER — Ambulatory Visit (INDEPENDENT_AMBULATORY_CARE_PROVIDER_SITE_OTHER): Admitting: Family Medicine

## 2024-11-01 DIAGNOSIS — Z6836 Body mass index (BMI) 36.0-36.9, adult: Secondary | ICD-10-CM | POA: Diagnosis not present

## 2024-11-01 DIAGNOSIS — R7303 Prediabetes: Secondary | ICD-10-CM

## 2024-11-01 DIAGNOSIS — G43009 Migraine without aura, not intractable, without status migrainosus: Secondary | ICD-10-CM | POA: Diagnosis not present

## 2024-11-01 DIAGNOSIS — I10 Essential (primary) hypertension: Secondary | ICD-10-CM

## 2024-11-01 DIAGNOSIS — E559 Vitamin D deficiency, unspecified: Secondary | ICD-10-CM

## 2024-11-01 DIAGNOSIS — E669 Obesity, unspecified: Secondary | ICD-10-CM | POA: Diagnosis not present

## 2024-11-01 MED ORDER — TOPIRAMATE 25 MG PO TABS: ORAL_TABLET | ORAL | 0 refills | Status: AC

## 2024-11-01 MED ORDER — METFORMIN HCL 500 MG PO TABS: 500.0000 mg | ORAL_TABLET | Freq: Every day | ORAL | 0 refills | Status: AC

## 2024-11-01 NOTE — Progress Notes (Signed)
 "  Kim Miles, D.O.  ABFM, ABOM Specializing in Clinical Bariatric Medicine  Office located at: [address] [place], [place]  [address]      A) FOR THE CHRONIC DISEASE OF OBESITY:  Obesity, beginning BMI 38.54 Obesity (BMI 30-39.9) current BMI 36.95  Chief complaint: Obesity Kim Miles is here to discuss her progress with her obesity treatment plan.   History of present illness / Interval history:  Kim Miles is here today for her follow-up office visit.  Since last OV on 09/28/2024, pt is up 1 lb.    09/28/24 09:00 11/01/24 08:00   Body Fat % 42.2 % 40 %  Muscle Mass (lbs) 101.8 lbs 106.4 lbs  Fat Mass (lbs) 78.2 lbs 74.8 lbs  Total Body Water  (lbs) 72 lbs 72.2 lbs  Visceral Fat Rating  10 9  Counseling done on how various foods will affect these numbers and how to maximize success   Total lbs lost to date: -8 lbs Total Fat Mass in lbs lost to date: -10.2 Total weight loss percentage to date: -4.12 %   Nutrition Therapy She is on the Category 1 Plan with 8 ounces of protein at dinner  and states she is following her eating plan approximately 20 % of the time.   - Tracking Calories/Macros: no  - Eating More Whole Foods: yes  - Adequate Protein Intake: no  - Adequate Water  Intake: no  - Skipping Meals: no  - Sleeping 7-9 Hours/ Night: yes   Auria is currently in the action stage of change. As such, her goal is to continue weight management plan.  She has agreed to: continue current plan   Physical Activity Pt is walking 30  minutes 3-4 days per week   Kynnedy has been advised to work up to 300-450 minutes of moderate intensity aerobic activity a week and strengthening exercises 2-3 times per week for cardiovascular health, weight loss maintenance and preservation of muscle mass.  She has agreed to : Continue current level of physical activity , Increase physical activity in their day and reduce sedentary time (increase NEAT)., Increase the  intensity, frequency or duration of aerobic exercises  , Increase volume of physical activity to a goal of 240 minutes a week, and Combine aerobic and strengthening exercises for efficiency and improved cardiometabolic health.   Behavioral Modifications Evidence-based interventions for health behavior change were utilized today including the discussion of  1) self monitoring techniques:  n/a 2) problem-solving barriers:  Cost-prohibitive weight loss medications 3) self care:  exercise 4) SMART goals for next OV:  none Regarding patient's less desirable eating habits and patterns, we employed the technique of small changes.   We discussed the following today: increasing lean protein intake to established goals, keeping healthy foods at home, avoiding temptations and identifying enticing environmental cues, continue to work on implementation of reduced calorie nutritional plan, and continue to practice mindfulness when eating Additional resources provided today: None   Medical Interventions/ Pharmacotherapy Previous Bariatric surgery: none Pharmacotherapy for weight loss: She is currently taking Metformin  500 mg twice daily  for medical weight loss.    Reports she has not been taking her medication recently. Offered GLP1 medications for weight loss, but her insurance does not pay for medications and she declines self-pay.  We discussed various medication options to help Kim Miles with her weight loss efforts and we both agreed to : See Pre-Dm note.   B) OBESITY RELATED CONDITIONS ADDRESSED TODAY:  CHECK FASTING LABS  ON 2/11 OFFICE VISIT  Prediabetes Assessment & Plan Lab Results  Component Value Date   HGBA1C 6.1 (H) 08/15/2024   HGBA1C 6.3 05/04/2024   HGBA1C 6.1 (H) 12/20/2023   INSULIN  20.7 08/15/2024  Prescribed Metformin  500 mg once daily. Has not been taking medication because she felt increased hunger and cravings on medication. Denies GI side effects. We discussed the risks and  benefits of various medication options to help manage Pre-DM and promote weight loss. Change Metformin  dose to 500 mg once daily and start topiramate  25 mg once daily. Start with Topiramate  1/2 dose daily for a week and then titrate up to full dose.  Pt is aware of the side effects of medication. Advised pt to ahere to MP and focus on whole foods to subside the side effects. Increase exercise as able.     Essential hypertension Assessment & Plan Last 3 blood pressure readings in our office are as follows: BP Readings from Last 3 Encounters:  11/01/24 110/77  09/28/24 109/79  09/13/24 118/83   The ASCVD Risk score (Arnett DK, et al., 2019) failed to calculate for the following reasons:   The 2019 ASCVD risk score is only valid for ages 71 to 65  Lab Results  Component Value Date   CREATININE 0.58 08/15/2024  On Valsartan  80 mg once daily with good compliance and tolerance. BP is at goal today. Pt asx. No acute concerns. Cont medication. Cont low-sodium, heart-healthy diet. Increase water  and exercise. F/u with PCP as needed.     Vitamin D  deficiency Assessment & Plan Lab Results  Component Value Date   VD25OH 61.1 08/15/2024   VD25OH 26.71 (L) 05/04/2024   VD25OH 17.4 (L) 12/20/2023  Currently taking OTC Vit D 5,000 units once daily with good compliance and tolerance. Vit D levels are at goal. No acute concerns. Cont regimen. Will recheck levels in 1-2 months.    Migraine without aura and without status migrainosus, not intractable Assessment & Plan Has a history of migraines. Reports she gets headaches a couple times a month. Start Topiramate  25 mg once daily, start half a tab for a week and then titrate up to full dose once daily. Pt is aware of side effects and understand teratogenic effects of medication. She states she is not trying to get pregnant and understands the risks. Was also told side effect of feeling sleepy or spacy and was recommended to take medication at night if  this occurs. Encouraged to eat whole foods and increase protein to subside side effects. Will cont to monitor.      Medications Discontinued During This Encounter  Medication Reason   metFORMIN  (GLUCOPHAGE ) 500 MG tablet Reorder     Meds ordered this encounter  Medications   metFORMIN  (GLUCOPHAGE ) 500 MG tablet    Sig: Take 1 tablet (500 mg total) by mouth daily after breakfast.    Dispense:  30 tablet    Refill:  0   topiramate  (TOPAMAX ) 25 MG tablet    Sig: One half tab by mouth daily for a week, then one tab by mouth daily.    Dispense:  30 tablet    Refill:  0      Follow up:   Return 11/22/2024 9:00 AM.  She was informed of the importance of frequent follow up visits to maximize her success with intensive lifestyle modifications for her multiple health conditions.   Weight Summary and Biometrics   Weight Lost Since Last Visit: 0lb  Weight Gained Since Last  Visit: 1lb    Vitals Temp: 98 F (36.7 C) BP: 110/77 Pulse Rate: 82 SpO2: 98 %   Anthropometric Measurements Height: 4' 11.5 (1.511 m) Weight: 186 lb (84.4 kg) BMI (Calculated): 36.95 Weight at Last Visit: 185lb Weight Lost Since Last Visit: 0lb Weight Gained Since Last Visit: 1lb Starting Weight: 194lb Total Weight Loss (lbs): 8 lb (3.629 kg) Peak Weight: 194lb   Body Composition  Body Fat %: 40 % Fat Mass (lbs): 74.8 lbs Muscle Mass (lbs): 106.4 lbs Total Body Water  (lbs): 72.2 lbs Visceral Fat Rating : 9   Other Clinical Data Fasting: yes Labs: no Today's Visit #: 5 Starting Date: 08/15/24    Objective:   PHYSICAL EXAM: Blood pressure 110/77, pulse 82, temperature 98 F (36.7 C), height 4' 11.5 (1.511 m), weight 186 lb (84.4 kg), last menstrual period 10/29/2024, SpO2 98%. Body mass index is 36.94 kg/m.  General: she is overweight, cooperative and in no acute distress. PSYCH: Has normal mood, affect and thought process.   HEENT: EOMI, sclerae are anicteric. Lungs: Normal  breathing effort, no conversational dyspnea. Extremities: Moves * 4 Neurologic: A and O * 3, good insight  DIAGNOSTIC DATA REVIEWED: BMET    Component Value Date/Time   NA 138 08/15/2024 0943   K 4.3 08/15/2024 0943   CL 101 08/15/2024 0943   CO2 22 08/15/2024 0943   GLUCOSE 109 (H) 08/15/2024 0943   GLUCOSE 113 (H) 06/10/2022 0747   BUN 11 08/15/2024 0943   CREATININE 0.58 08/15/2024 0943   CREATININE 0.64 06/11/2021 1127   CALCIUM  9.1 08/15/2024 0943   GFRNONAA >60 06/08/2022 0131   GFRNONAA 106 06/03/2020 1616   GFRAA 123 06/03/2020 1616   Lab Results  Component Value Date   HGBA1C 6.1 (H) 08/15/2024   HGBA1C 5.7 (H) 08/15/2019   Lab Results  Component Value Date   INSULIN  20.7 08/15/2024   Lab Results  Component Value Date   TSH 0.754 08/15/2024   CBC    Component Value Date/Time   WBC 6.5 08/15/2024 0943   WBC 9.2 06/08/2022 0131   RBC 4.40 08/15/2024 0943   RBC 3.53 (L) 06/08/2022 0131   HGB 13.2 08/15/2024 0943   HCT 40.8 08/15/2024 0943   PLT 273 08/15/2024 0943   MCV 93 08/15/2024 0943   MCH 30.0 08/15/2024 0943   MCH 30.3 06/08/2022 0131   MCHC 32.4 08/15/2024 0943   MCHC 33.4 06/08/2022 0131   RDW 12.3 08/15/2024 0943   Iron Studies No results found for: IRON, TIBC, FERRITIN, IRONPCTSAT Lipid Panel     Component Value Date/Time   CHOL 151 08/15/2024 0943   TRIG 137 08/15/2024 0943   HDL 41 08/15/2024 0943   CHOLHDL 3.7 08/15/2024 0943   CHOLHDL 5.7 (H) 06/11/2021 1127   LDLCALC 86 08/15/2024 0943   LDLCALC 182 (H) 06/11/2021 1127   Hepatic Function Panel     Component Value Date/Time   PROT 7.2 08/15/2024 0943   ALBUMIN 4.3 08/15/2024 0943   AST 27 08/15/2024 0943   ALT 15 08/15/2024 0943   ALKPHOS 90 08/15/2024 0943   BILITOT 0.3 08/15/2024 0943      Component Value Date/Time   TSH 0.754 08/15/2024 0943   Nutritional Lab Results  Component Value Date   VD25OH 61.1 08/15/2024   VD25OH 26.71 (L) 05/04/2024   VD25OH  17.4 (L) 12/20/2023    Attestations:   LILLETTE Feliciano Mingle, acting as a stage manager for [employer], DO., have compiled all  relevant documentation for today's office visit on behalf of Kim Jenkins, DO, while in the presence of [employer], DO.    I have reviewed the above documentation for accuracy and completeness, and I agree with the above. Kim JINNY Miles, D.O.  The 21st Century Cures Act was signed into law in 2016 which includes the topic of electronic health records.  This provides immediate access to information in MyChart.  This includes consultation notes, operative notes, office notes, lab results and pathology reports.  If you have any questions about what you read please let us  know at your next visit so we can discuss your concerns and take corrective action if need be.  We are right here with you.  "

## 2024-11-22 ENCOUNTER — Ambulatory Visit (INDEPENDENT_AMBULATORY_CARE_PROVIDER_SITE_OTHER): Admitting: Family Medicine

## 2024-11-24 ENCOUNTER — Other Ambulatory Visit (INDEPENDENT_AMBULATORY_CARE_PROVIDER_SITE_OTHER): Payer: Self-pay | Admitting: Family Medicine

## 2024-11-24 DIAGNOSIS — R7303 Prediabetes: Secondary | ICD-10-CM

## 2024-11-28 ENCOUNTER — Ambulatory Visit: Admitting: Family

## 2024-11-28 ENCOUNTER — Encounter: Payer: Self-pay | Admitting: Family

## 2024-11-28 VITALS — BP 108/72 | HR 78 | Temp 97.7°F | Resp 16 | Ht 59.3 in | Wt 188.0 lb

## 2024-11-28 DIAGNOSIS — I10 Essential (primary) hypertension: Secondary | ICD-10-CM

## 2024-11-28 DIAGNOSIS — E782 Mixed hyperlipidemia: Secondary | ICD-10-CM

## 2024-11-28 DIAGNOSIS — R7303 Prediabetes: Secondary | ICD-10-CM

## 2024-11-28 DIAGNOSIS — Z8669 Personal history of other diseases of the nervous system and sense organs: Secondary | ICD-10-CM | POA: Insufficient documentation

## 2024-11-28 DIAGNOSIS — R87610 Atypical squamous cells of undetermined significance on cytologic smear of cervix (ASC-US): Secondary | ICD-10-CM

## 2024-11-28 MED ORDER — VALSARTAN 80 MG PO TABS: 80.0000 mg | ORAL_TABLET | Freq: Every day | ORAL | 0 refills | Status: AC

## 2024-11-28 MED ORDER — ROSUVASTATIN CALCIUM 5 MG PO TABS: 5.0000 mg | ORAL_TABLET | Freq: Every day | ORAL | 1 refills | Status: AC

## 2024-11-28 NOTE — Progress Notes (Signed)
 "  Subjective:     Patient ID: Kim Miles, female    DOB: 29-May-1991, 34 y.o.   MRN: [id number]  Chief Complaint  Patient presents with   Transitions Of Care    HPI  Discussed the use of AI scribe software for clinical note transcription with the patient, who gave verbal consent to proceed.  History of Present Illness Kim Miles is a 34 year old female who presents for medication refills and follow-up.  She has a history of depression that began during her time in grad school, which affected her work. She has attempted to discontinue her antidepressant medication several times but finds her condition is better managed while on it. She is currently taking citalopram, with refills available.  Her last A1c test in October showed a level of 6.1. She has never had an A1c over 6.5. She is currently taking metformin  500 mg once daily and has been participating in the [employer] [facility], which has helped her improve her diet and manage her weight.  She takes Crestor  (rosuvastatin ) for cholesterol management, and her last cholesterol test in October showed good results. She is also on a low dose of valsartan  for blood pressure control.  She has a history of migraines and takes Topamax  for both weight loss and migraine management. The medication helps decrease the frequency of her migraines.  During her pregnancy in 2023, she had a false positive hepatitis C antibody test, but the RNA test was negative. She has no risk factors for hepatitis C, such as IV drug use.  She has had an ASCUS Pap smear with negative HPV in July, with a recommendation to repeat the test in one to three years.  She has received hepatitis B vaccinations, likely as an adolescent, and recently received another dose at [facility] within the last six months.      There are no preventive care reminders to display for this patient.   Past Medical History:  Diagnosis Date   Anxiety     Bilateral edema of lower extremity    Constipation    Depression    GERD (gastroesophageal reflux disease)    Gestational diabetes    Hepatitis C antibody test positive-RNA negative  01/06/2022   History of severe pre-eclampsia 05/05/2022   Hyperlipemia    Hypertension    chronic ,  prior to pregnancy   Menstrual migraine without status migrainosus, not intractable 01/01/2020   well controlled with [id number] - pt skipping placebo and withdrawal bleed - reviewed OCP, monitoring, SE/risk.   Pre-diabetes    Retained products of conception, postpartum    Severe preeclampsia, third trimester 04/2022    Past Surgical History:  Procedure Laterality Date   CESAREAN SECTION N/A 05/18/2022   Procedure: CESAREAN SECTION;  Surgeon: Lola Donnice HERO, MD;  Location: [facility];  Service: Obstetrics;  Laterality: N/A;   DILATION AND EVACUATION N/A 06/10/2022   Procedure: DILATATION AND EVACUATION;  Surgeon: Alger Gong, MD;  Location: [facility];  Service: Gynecology;  Laterality: N/A;    Family History  Problem Relation Age of Onset   Asthma Mother    Depression Mother    Anxiety disorder Mother    Hyperlipidemia Mother    Hypertension Father    Sleep apnea Father    Obesity Father    Anemia Sister    Eating disorder Sister    Breast cancer Maternal Grandmother    Cancer Maternal Grandfather    Diabetes Maternal  Grandfather    Alzheimer's disease Maternal Grandfather    Heart attack Paternal Grandmother    Arthritis Paternal Grandmother     Social History   Socioeconomic History   Marital status: Married    Spouse name: Samayra Hebel   Number of children: 0   Years of education: 12   Highest education level: Master's degree (e.g., MA, MS, MEng, MEd, MSW, MBA)  Occupational History   Not on file  Tobacco Use   Smoking status: Never    Passive exposure: Never   Smokeless tobacco: Never  Vaping Use   Vaping status: Never Used  Substance and Sexual Activity    Alcohol use: Never   Drug use: Never   Sexual activity: Not Currently    Birth control/protection: None  Other Topics Concern   Not on file  Social History Narrative   One son   Married   Stay at home Mom   No pets   Enjoys crocheting and sewing   Completed graduate degree Licensed Conveyancer)   Social Drivers of Health   Tobacco Use: Low Risk (11/01/2024)   Patient History    Smoking Tobacco Use: Never    Smokeless Tobacco Use: Never    Passive Exposure: Never  Financial Resource Strain: Low Risk (05/01/2024)   Overall Financial Resource Strain (CARDIA)    Difficulty of Paying Living Expenses: Not hard at all  Food Insecurity: No Food Insecurity (05/01/2024)   Epic    Worried About Radiation Protection Practitioner of Food in the Last Year: Never true    Ran Out of Food in the Last Year: Never true  Transportation Needs: No Transportation Needs (05/01/2024)   Epic    Lack of Transportation (Medical): No    Lack of Transportation (Non-Medical): No  Physical Activity: Insufficiently Active (05/01/2024)   Exercise Vital Sign    Days of Exercise per Week: 2 days    Minutes of Exercise per Session: 30 min  Stress: No Stress Concern Present (05/01/2024)   [employer] of Occupational Health - Occupational Stress Questionnaire    Feeling of Stress: Only a little  Social Connections: Socially Integrated (05/01/2024)   Social Connection and Isolation Panel    Frequency of Communication with Friends and Family: More than three times a week    Frequency of Social Gatherings with Friends and Family: Once a week    Attends Religious Services: More than 4 times per year    Active Member of [employer] or Organizations: Yes    Attends Banker Meetings: More than 4 times per year    Marital Status: Married  Catering Manager Violence: Unknown (01/30/2022)   Received from [facility]   HITS    Physically Hurt: Not on file    Insult or Talk Down To: Not on file    Threaten Physical Harm: Not on file     Scream or Curse: Not on file  Depression (PHQ2-9): Medium Risk (11/28/2024)   Depression (PHQ2-9)    PHQ-2 Score: 7  Alcohol Screen: Not on file  Housing: Low Risk (05/01/2024)   Epic    Unable to Pay for Housing in the Last Year: No    Number of Times Moved in the Last Year: 1    Homeless in the Last Year: No  Utilities: Not on file  Health Literacy: Not on file    Outpatient Medications Prior to Visit  Medication Sig Dispense Refill   cholecalciferol (VITAMIN D3) 25 MCG (1000 UNIT) tablet Take 5,000 Units  by mouth daily.     cyanocobalamin  (VITAMIN B12) 500 MCG tablet Take 1 tablet (500 mcg total) by mouth daily.     escitalopram  (LEXAPRO ) 20 MG tablet Take 1 tablet (20 mg total) by mouth daily. 90 tablet 1   metFORMIN  (GLUCOPHAGE ) 500 MG tablet Take 1 tablet (500 mg total) by mouth daily after breakfast. 30 tablet 0   topiramate  (TOPAMAX ) 25 MG tablet One half tab by mouth daily for a week, then one tab by mouth daily. 30 tablet 0   rosuvastatin  (CRESTOR ) 5 MG tablet Take 1 tablet (5 mg total) by mouth daily. 90 tablet 1   valsartan  (DIOVAN ) 80 MG tablet Take 1 tablet (80 mg total) by mouth daily. 90 tablet 0   No facility-administered medications prior to visit.    Allergies[1]  ROS See HPI    Objective:    Physical Exam Constitutional:      General: She is not in acute distress.    Appearance: Normal appearance. She is well-developed.  HENT:     Head: Normocephalic and atraumatic.     Right Ear: External ear normal.     Left Ear: External ear normal.  Eyes:     General: No scleral icterus. Neck:     Thyroid: No thyromegaly.  Cardiovascular:     Rate and Rhythm: Normal rate and regular rhythm.     Heart sounds: Normal heart sounds. No murmur heard. Pulmonary:     Effort: Pulmonary effort is normal. No respiratory distress.     Breath sounds: Normal breath sounds. No wheezing.  Musculoskeletal:     Cervical back: Neck supple.  Skin:    General: Skin is warm and  dry.  Neurological:     Mental Status: She is alert and oriented to person, place, and time.  Psychiatric:        Mood and Affect: Mood normal.        Behavior: Behavior normal.        Thought Content: Thought content normal.        Judgment: Judgment normal.      BP 108/72 (BP Location: Right Arm, Patient Position: Sitting, Cuff Size: Normal)   Pulse 78   Temp 97.7 F (36.5 C) (Oral)   Resp 16   Ht 4' 11.3 (1.506 m)   Wt 188 lb (85.3 kg)   LMP 10/29/2024   SpO2 99%   BMI 37.59 kg/m  Wt Readings from Last 3 Encounters:  11/28/24 188 lb (85.3 kg)  11/01/24 186 lb (84.4 kg)  09/28/24 185 lb (83.9 kg)       Assessment & Plan:   Problem List Items Addressed This Visit       Unprioritized   Prediabetes - Primary   Lab Results  Component Value Date   HGBA1C 6.1 (H) 08/15/2024   HGBA1C 6.3 05/04/2024   HGBA1C 6.1 (H) 12/20/2023   Lab Results  Component Value Date   LDLCALC 86 08/15/2024   CREATININE 0.58 08/15/2024   A1c at 6.1 indicates prediabetes. No A1c history over 6.5. Improved dietary habits with weight management program. - Continue metformin  500 mg once daily. - Encouraged continuation of healthy weight management program.        Mixed hyperlipidemia   Lab Results  Component Value Date   CHOL 151 08/15/2024   HDL 41 08/15/2024   LDLCALC 86 08/15/2024   TRIG 137 08/15/2024   CHOLHDL 3.7 08/15/2024   At goal on crestor .  Continue same.  Relevant Medications   valsartan  (DIOVAN ) 80 MG tablet   rosuvastatin  (CRESTOR ) 5 MG tablet   Hx of migraines   Well controlled with topamax .  This is currently being prescribed by her weight loss specialist to help with weight loss as well.       Essential hypertension   BP Readings from Last 3 Encounters:  11/28/24 108/72  11/01/24 110/77  09/28/24 109/79   Blood pressure well-controlled on low dose valsartan . Weight loss may allow for eventual discontinuation. - Continue valsartan . - Monitor  blood pressure regularly.      Relevant Medications   valsartan  (DIOVAN ) 80 MG tablet   rosuvastatin  (CRESTOR ) 5 MG tablet   ASCUS of cervix with negative high risk HPV pap pn 04/25/2024   Following with GYN with plan to repeat with cotesting in 1-3 years.       Assessment & Plan   General Health Maintenance Hepatitis B vaccination series completed. Normal Vitamin D  and thyroid levels in October. - Ensure kidney function is checked in the next month or two, possibly during Dr. Kathrin visit.    I am having Camie SQUIBB. Malcolm maintain her cyanocobalamin , cholecalciferol, escitalopram , metFORMIN , topiramate , valsartan , and rosuvastatin .  Meds ordered this encounter  Medications   valsartan  (DIOVAN ) 80 MG tablet    Sig: Take 1 tablet (80 mg total) by mouth daily.    Dispense:  90 tablet    Refill:  0    Supervising Provider:   DOMENICA BLACKBIRD A [[id number]]   rosuvastatin  (CRESTOR ) 5 MG tablet    Sig: Take 1 tablet (5 mg total) by mouth daily.    Dispense:  90 tablet    Refill:  1    Supervising Provider:   DOMENICA BLACKBIRD A [[id number]]      [1] No Known Allergies  "

## 2024-11-28 NOTE — Assessment & Plan Note (Addendum)
 BP Readings from Last 3 Encounters:  11/28/24 108/72  11/01/24 110/77  09/28/24 109/79   Blood pressure well-controlled on low dose valsartan . Weight loss may allow for eventual discontinuation. - Continue valsartan . - Monitor blood pressure regularly.

## 2024-11-28 NOTE — Assessment & Plan Note (Signed)
 Following with GYN with plan to repeat with cotesting in 1-3 years.

## 2024-12-06 ENCOUNTER — Ambulatory Visit (INDEPENDENT_AMBULATORY_CARE_PROVIDER_SITE_OTHER): Admitting: Family Medicine
